# Patient Record
Sex: Female | Born: 1980 | Race: White | Hispanic: No | Marital: Married | Smoking: Former smoker
Health system: Southern US, Community
[De-identification: ages and names within clinical notes are randomized; demographics above are authoritative.]

## PROBLEM LIST (undated history)

## (undated) ENCOUNTER — Inpatient Hospital Stay (HOSPITAL_COMMUNITY): Payer: Self-pay

## (undated) DIAGNOSIS — N83209 Unspecified ovarian cyst, unspecified side: Secondary | ICD-10-CM

## (undated) DIAGNOSIS — A4902 Methicillin resistant Staphylococcus aureus infection, unspecified site: Secondary | ICD-10-CM

## (undated) DIAGNOSIS — R51 Headache: Secondary | ICD-10-CM

## (undated) DIAGNOSIS — F329 Major depressive disorder, single episode, unspecified: Secondary | ICD-10-CM

## (undated) DIAGNOSIS — N200 Calculus of kidney: Secondary | ICD-10-CM

## (undated) DIAGNOSIS — IMO0002 Reserved for concepts with insufficient information to code with codable children: Secondary | ICD-10-CM

## (undated) DIAGNOSIS — D649 Anemia, unspecified: Secondary | ICD-10-CM

## (undated) DIAGNOSIS — R87629 Unspecified abnormal cytological findings in specimens from vagina: Secondary | ICD-10-CM

## (undated) DIAGNOSIS — Z87442 Personal history of urinary calculi: Secondary | ICD-10-CM

## (undated) DIAGNOSIS — R87619 Unspecified abnormal cytological findings in specimens from cervix uteri: Secondary | ICD-10-CM

## (undated) DIAGNOSIS — F32A Depression, unspecified: Secondary | ICD-10-CM

## (undated) DIAGNOSIS — R519 Headache, unspecified: Secondary | ICD-10-CM

## (undated) HISTORY — DX: Headache: R51

## (undated) HISTORY — PX: COLPOSCOPY: SHX161

## (undated) HISTORY — PX: DENTAL SURGERY: SHX609

## (undated) HISTORY — DX: Headache, unspecified: R51.9

## (undated) HISTORY — DX: Major depressive disorder, single episode, unspecified: F32.9

## (undated) HISTORY — DX: Depression, unspecified: F32.A

## (undated) HISTORY — PX: CHOLECYSTECTOMY: SHX55

---
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
## 1999-01-10 ENCOUNTER — Encounter: Admission: RE | Admit: 1999-01-10 | Discharge: 1999-01-10 | Payer: Self-pay | Admitting: Family Medicine

## 1999-01-23 ENCOUNTER — Encounter: Admission: RE | Admit: 1999-01-23 | Discharge: 1999-01-23 | Payer: Self-pay | Admitting: Family Medicine

## 1999-02-04 ENCOUNTER — Inpatient Hospital Stay (HOSPITAL_COMMUNITY): Admission: AD | Admit: 1999-02-04 | Discharge: 1999-02-04 | Payer: Self-pay | Admitting: Obstetrics & Gynecology

## 1999-02-05 ENCOUNTER — Inpatient Hospital Stay (HOSPITAL_COMMUNITY): Admission: AD | Admit: 1999-02-05 | Discharge: 1999-02-08 | Payer: Self-pay | Admitting: *Deleted

## 1999-02-07 ENCOUNTER — Encounter: Payer: Self-pay | Admitting: *Deleted

## 1999-02-13 ENCOUNTER — Encounter: Admission: RE | Admit: 1999-02-13 | Discharge: 1999-02-13 | Payer: Self-pay | Admitting: Family Medicine

## 1999-02-16 ENCOUNTER — Inpatient Hospital Stay (HOSPITAL_COMMUNITY): Admission: AD | Admit: 1999-02-16 | Discharge: 1999-02-16 | Payer: Self-pay | Admitting: Obstetrics

## 1999-02-22 ENCOUNTER — Encounter: Admission: RE | Admit: 1999-02-22 | Discharge: 1999-02-22 | Payer: Self-pay | Admitting: Family Medicine

## 1999-02-27 ENCOUNTER — Encounter: Admission: RE | Admit: 1999-02-27 | Discharge: 1999-02-27 | Payer: Self-pay | Admitting: Family Medicine

## 1999-03-11 ENCOUNTER — Inpatient Hospital Stay (HOSPITAL_COMMUNITY): Admission: AD | Admit: 1999-03-11 | Discharge: 1999-03-11 | Payer: Self-pay | Admitting: Obstetrics

## 1999-03-12 ENCOUNTER — Inpatient Hospital Stay (HOSPITAL_COMMUNITY): Admission: AD | Admit: 1999-03-12 | Discharge: 1999-03-12 | Payer: Self-pay | Admitting: Obstetrics

## 1999-03-15 ENCOUNTER — Encounter: Admission: RE | Admit: 1999-03-15 | Discharge: 1999-03-15 | Payer: Self-pay | Admitting: Family Medicine

## 1999-03-19 ENCOUNTER — Inpatient Hospital Stay (HOSPITAL_COMMUNITY): Admission: AD | Admit: 1999-03-19 | Discharge: 1999-03-19 | Payer: Self-pay | Admitting: *Deleted

## 1999-03-19 ENCOUNTER — Encounter: Admission: RE | Admit: 1999-03-19 | Discharge: 1999-03-19 | Payer: Self-pay | Admitting: Family Medicine

## 1999-03-20 ENCOUNTER — Inpatient Hospital Stay (HOSPITAL_COMMUNITY): Admission: AD | Admit: 1999-03-20 | Discharge: 1999-03-20 | Payer: Self-pay | Admitting: Obstetrics

## 1999-03-21 ENCOUNTER — Inpatient Hospital Stay (HOSPITAL_COMMUNITY): Admission: AD | Admit: 1999-03-21 | Discharge: 1999-03-21 | Payer: Self-pay | Admitting: Obstetrics & Gynecology

## 1999-03-23 ENCOUNTER — Inpatient Hospital Stay (HOSPITAL_COMMUNITY): Admission: AD | Admit: 1999-03-23 | Discharge: 1999-03-23 | Payer: Self-pay | Admitting: *Deleted

## 1999-03-25 ENCOUNTER — Inpatient Hospital Stay (HOSPITAL_COMMUNITY): Admission: AD | Admit: 1999-03-25 | Discharge: 1999-03-25 | Payer: Self-pay | Admitting: Obstetrics & Gynecology

## 1999-03-27 ENCOUNTER — Encounter: Admission: RE | Admit: 1999-03-27 | Discharge: 1999-03-27 | Payer: Self-pay | Admitting: Family Medicine

## 1999-03-27 ENCOUNTER — Inpatient Hospital Stay (HOSPITAL_COMMUNITY): Admission: AD | Admit: 1999-03-27 | Discharge: 1999-03-27 | Payer: Self-pay | Admitting: Obstetrics

## 1999-03-30 ENCOUNTER — Inpatient Hospital Stay (HOSPITAL_COMMUNITY): Admission: AD | Admit: 1999-03-30 | Discharge: 1999-04-01 | Payer: Self-pay | Admitting: *Deleted

## 1999-04-02 ENCOUNTER — Encounter: Admission: RE | Admit: 1999-04-02 | Discharge: 1999-04-02 | Payer: Self-pay | Admitting: Sports Medicine

## 1999-04-26 ENCOUNTER — Encounter (INDEPENDENT_AMBULATORY_CARE_PROVIDER_SITE_OTHER): Payer: Self-pay | Admitting: *Deleted

## 1999-04-26 LAB — CONVERTED CEMR LAB

## 1999-05-09 ENCOUNTER — Encounter: Admission: RE | Admit: 1999-05-09 | Discharge: 1999-05-09 | Payer: Self-pay | Admitting: Family Medicine

## 1999-05-15 ENCOUNTER — Other Ambulatory Visit: Admission: RE | Admit: 1999-05-15 | Discharge: 1999-05-15 | Payer: Self-pay | Admitting: Obstetrics & Gynecology

## 1999-05-15 ENCOUNTER — Encounter: Admission: RE | Admit: 1999-05-15 | Discharge: 1999-05-15 | Payer: Self-pay | Admitting: Family Medicine

## 1999-07-29 ENCOUNTER — Encounter: Admission: RE | Admit: 1999-07-29 | Discharge: 1999-07-29 | Payer: Self-pay | Admitting: Family Medicine

## 1999-08-13 ENCOUNTER — Ambulatory Visit (HOSPITAL_COMMUNITY): Admission: RE | Admit: 1999-08-13 | Discharge: 1999-08-13 | Payer: Self-pay | Admitting: *Deleted

## 1999-08-13 ENCOUNTER — Encounter: Admission: RE | Admit: 1999-08-13 | Discharge: 1999-08-13 | Payer: Self-pay | Admitting: Sports Medicine

## 1999-08-20 ENCOUNTER — Encounter: Admission: RE | Admit: 1999-08-20 | Discharge: 1999-08-20 | Payer: Self-pay | Admitting: Sports Medicine

## 1999-08-20 ENCOUNTER — Other Ambulatory Visit: Admission: RE | Admit: 1999-08-20 | Discharge: 1999-08-20 | Payer: Self-pay | Admitting: Obstetrics & Gynecology

## 1999-08-27 ENCOUNTER — Encounter: Admission: RE | Admit: 1999-08-27 | Discharge: 1999-08-27 | Payer: Self-pay | Admitting: Sports Medicine

## 1999-08-29 ENCOUNTER — Encounter: Admission: RE | Admit: 1999-08-29 | Discharge: 1999-08-29 | Payer: Self-pay | Admitting: Family Medicine

## 1999-10-08 ENCOUNTER — Encounter: Admission: RE | Admit: 1999-10-08 | Discharge: 1999-10-08 | Payer: Self-pay | Admitting: Sports Medicine

## 1999-10-08 ENCOUNTER — Other Ambulatory Visit: Admission: RE | Admit: 1999-10-08 | Discharge: 1999-10-08 | Payer: Self-pay | Admitting: Family Medicine

## 1999-10-10 ENCOUNTER — Encounter: Admission: RE | Admit: 1999-10-10 | Discharge: 1999-10-10 | Payer: Self-pay | Admitting: Family Medicine

## 1999-11-18 ENCOUNTER — Encounter: Admission: RE | Admit: 1999-11-18 | Discharge: 1999-11-18 | Payer: Self-pay | Admitting: Family Medicine

## 2000-02-04 ENCOUNTER — Encounter: Admission: RE | Admit: 2000-02-04 | Discharge: 2000-02-04 | Payer: Self-pay | Admitting: Family Medicine

## 2000-03-03 ENCOUNTER — Encounter: Admission: RE | Admit: 2000-03-03 | Discharge: 2000-03-03 | Payer: Self-pay | Admitting: Family Medicine

## 2000-03-09 ENCOUNTER — Encounter: Admission: RE | Admit: 2000-03-09 | Discharge: 2000-03-09 | Payer: Self-pay | Admitting: Family Medicine

## 2000-06-02 ENCOUNTER — Emergency Department (HOSPITAL_COMMUNITY): Admission: EM | Admit: 2000-06-02 | Discharge: 2000-06-02 | Payer: Self-pay | Admitting: Emergency Medicine

## 2000-06-02 ENCOUNTER — Encounter: Payer: Self-pay | Admitting: Emergency Medicine

## 2000-07-09 ENCOUNTER — Encounter: Admission: RE | Admit: 2000-07-09 | Discharge: 2000-10-07 | Payer: Self-pay | Admitting: Orthopedic Surgery

## 2000-09-10 ENCOUNTER — Encounter: Admission: RE | Admit: 2000-09-10 | Discharge: 2000-09-10 | Payer: Self-pay | Admitting: Family Medicine

## 2000-09-18 ENCOUNTER — Other Ambulatory Visit: Admission: RE | Admit: 2000-09-18 | Discharge: 2000-09-18 | Payer: Self-pay | Admitting: Obstetrics & Gynecology

## 2000-09-18 ENCOUNTER — Encounter: Admission: RE | Admit: 2000-09-18 | Discharge: 2000-09-18 | Payer: Self-pay | Admitting: Family Medicine

## 2000-09-18 ENCOUNTER — Other Ambulatory Visit: Admission: RE | Admit: 2000-09-18 | Discharge: 2000-09-18 | Payer: Self-pay | Admitting: Family Medicine

## 2000-10-08 ENCOUNTER — Encounter: Admission: RE | Admit: 2000-10-08 | Discharge: 2000-10-08 | Payer: Self-pay | Admitting: Family Medicine

## 2002-01-26 ENCOUNTER — Encounter: Admission: RE | Admit: 2002-01-26 | Discharge: 2002-01-26 | Payer: Self-pay | Admitting: Family Medicine

## 2002-02-14 ENCOUNTER — Emergency Department (HOSPITAL_COMMUNITY): Admission: EM | Admit: 2002-02-14 | Discharge: 2002-02-14 | Payer: Self-pay

## 2002-02-14 ENCOUNTER — Emergency Department (HOSPITAL_COMMUNITY): Admission: EM | Admit: 2002-02-14 | Discharge: 2002-02-14 | Payer: Self-pay | Admitting: Emergency Medicine

## 2002-08-31 ENCOUNTER — Emergency Department (HOSPITAL_COMMUNITY): Admission: AD | Admit: 2002-08-31 | Discharge: 2002-08-31 | Payer: Self-pay | Admitting: Emergency Medicine

## 2003-07-15 ENCOUNTER — Emergency Department (HOSPITAL_COMMUNITY): Admission: EM | Admit: 2003-07-15 | Discharge: 2003-07-16 | Payer: Self-pay | Admitting: Emergency Medicine

## 2004-02-15 ENCOUNTER — Ambulatory Visit: Payer: Self-pay | Admitting: Family Medicine

## 2004-02-19 ENCOUNTER — Encounter: Admission: RE | Admit: 2004-02-19 | Discharge: 2004-02-19 | Payer: Self-pay | Admitting: Sports Medicine

## 2004-03-06 ENCOUNTER — Ambulatory Visit: Payer: Self-pay | Admitting: Family Medicine

## 2004-07-02 ENCOUNTER — Emergency Department (HOSPITAL_COMMUNITY): Admission: EM | Admit: 2004-07-02 | Discharge: 2004-07-02 | Payer: Self-pay | Admitting: Emergency Medicine

## 2004-08-09 ENCOUNTER — Ambulatory Visit (HOSPITAL_COMMUNITY): Admission: RE | Admit: 2004-08-09 | Discharge: 2004-08-09 | Payer: Self-pay | Admitting: Urology

## 2005-06-22 ENCOUNTER — Emergency Department (HOSPITAL_COMMUNITY): Admission: EM | Admit: 2005-06-22 | Discharge: 2005-06-22 | Payer: Self-pay | Admitting: Emergency Medicine

## 2006-07-23 DIAGNOSIS — F329 Major depressive disorder, single episode, unspecified: Secondary | ICD-10-CM | POA: Insufficient documentation

## 2006-07-23 DIAGNOSIS — F17200 Nicotine dependence, unspecified, uncomplicated: Secondary | ICD-10-CM | POA: Insufficient documentation

## 2006-07-24 ENCOUNTER — Encounter (INDEPENDENT_AMBULATORY_CARE_PROVIDER_SITE_OTHER): Payer: Self-pay | Admitting: *Deleted

## 2006-11-16 ENCOUNTER — Emergency Department (HOSPITAL_COMMUNITY): Admission: EM | Admit: 2006-11-16 | Discharge: 2006-11-17 | Payer: Self-pay | Admitting: Emergency Medicine

## 2006-11-18 ENCOUNTER — Emergency Department (HOSPITAL_COMMUNITY): Admission: EM | Admit: 2006-11-18 | Discharge: 2006-11-18 | Payer: Self-pay | Admitting: Emergency Medicine

## 2006-11-22 ENCOUNTER — Emergency Department (HOSPITAL_COMMUNITY): Admission: EM | Admit: 2006-11-22 | Discharge: 2006-11-23 | Payer: Self-pay | Admitting: Emergency Medicine

## 2006-12-11 ENCOUNTER — Emergency Department (HOSPITAL_COMMUNITY): Admission: EM | Admit: 2006-12-11 | Discharge: 2006-12-11 | Payer: Self-pay | Admitting: Emergency Medicine

## 2006-12-24 ENCOUNTER — Telehealth: Payer: Self-pay | Admitting: *Deleted

## 2006-12-25 ENCOUNTER — Encounter: Payer: Self-pay | Admitting: Family Medicine

## 2006-12-25 ENCOUNTER — Ambulatory Visit: Payer: Self-pay | Admitting: Family Medicine

## 2006-12-25 LAB — CONVERTED CEMR LAB
GC Probe Amp, Genital: NEGATIVE
KOH Prep: NEGATIVE
Whiff Test: NEGATIVE

## 2006-12-31 ENCOUNTER — Telehealth: Payer: Self-pay | Admitting: Family Medicine

## 2007-01-01 ENCOUNTER — Telehealth: Payer: Self-pay | Admitting: Family Medicine

## 2007-01-05 ENCOUNTER — Encounter: Payer: Self-pay | Admitting: Family Medicine

## 2007-02-24 ENCOUNTER — Emergency Department (HOSPITAL_COMMUNITY): Admission: EM | Admit: 2007-02-24 | Discharge: 2007-02-24 | Payer: Self-pay | Admitting: Emergency Medicine

## 2007-03-12 ENCOUNTER — Telehealth: Payer: Self-pay | Admitting: *Deleted

## 2007-03-18 ENCOUNTER — Ambulatory Visit: Payer: Self-pay | Admitting: Family Medicine

## 2007-04-01 ENCOUNTER — Ambulatory Visit: Payer: Self-pay | Admitting: Family Medicine

## 2007-04-07 ENCOUNTER — Ambulatory Visit: Payer: Self-pay | Admitting: Family Medicine

## 2007-04-07 ENCOUNTER — Encounter: Payer: Self-pay | Admitting: Family Medicine

## 2007-04-07 DIAGNOSIS — Z862 Personal history of diseases of the blood and blood-forming organs and certain disorders involving the immune mechanism: Secondary | ICD-10-CM | POA: Insufficient documentation

## 2007-04-07 LAB — CONVERTED CEMR LAB
MCHC: 33.2 g/dL (ref 30.0–36.0)
TSH: 1.344 microintl units/mL (ref 0.350–5.50)
WBC: 6.2 10*3/uL (ref 4.0–10.5)

## 2007-04-13 ENCOUNTER — Emergency Department (HOSPITAL_COMMUNITY): Admission: EM | Admit: 2007-04-13 | Discharge: 2007-04-13 | Payer: Self-pay | Admitting: Emergency Medicine

## 2007-05-24 ENCOUNTER — Ambulatory Visit: Payer: Self-pay

## 2007-05-27 DIAGNOSIS — N200 Calculus of kidney: Secondary | ICD-10-CM

## 2007-05-27 HISTORY — DX: Calculus of kidney: N20.0

## 2007-07-21 ENCOUNTER — Ambulatory Visit: Payer: Self-pay | Admitting: Family Medicine

## 2007-07-21 ENCOUNTER — Encounter: Payer: Self-pay | Admitting: Family Medicine

## 2007-07-21 ENCOUNTER — Telehealth: Payer: Self-pay | Admitting: *Deleted

## 2007-07-21 LAB — CONVERTED CEMR LAB
Amylase: 43 units/L (ref 0–105)
Chloride: 105 meq/L (ref 96–112)
Glucose, Bld: 85 mg/dL (ref 70–99)
Sodium: 139 meq/L (ref 135–145)

## 2007-07-23 ENCOUNTER — Telehealth: Payer: Self-pay | Admitting: Family Medicine

## 2007-10-04 ENCOUNTER — Telehealth: Payer: Self-pay | Admitting: *Deleted

## 2007-11-19 ENCOUNTER — Telehealth (INDEPENDENT_AMBULATORY_CARE_PROVIDER_SITE_OTHER): Payer: Self-pay | Admitting: *Deleted

## 2007-11-19 ENCOUNTER — Emergency Department (HOSPITAL_COMMUNITY): Admission: EM | Admit: 2007-11-19 | Discharge: 2007-11-19 | Payer: Self-pay | Admitting: Emergency Medicine

## 2007-12-20 ENCOUNTER — Telehealth: Payer: Self-pay | Admitting: *Deleted

## 2007-12-23 ENCOUNTER — Telehealth: Payer: Self-pay | Admitting: *Deleted

## 2007-12-26 ENCOUNTER — Emergency Department (HOSPITAL_BASED_OUTPATIENT_CLINIC_OR_DEPARTMENT_OTHER): Admission: EM | Admit: 2007-12-26 | Discharge: 2007-12-26 | Payer: Self-pay | Admitting: Emergency Medicine

## 2008-02-02 ENCOUNTER — Encounter: Payer: Self-pay | Admitting: Family Medicine

## 2008-02-02 ENCOUNTER — Other Ambulatory Visit: Admission: RE | Admit: 2008-02-02 | Discharge: 2008-02-02 | Payer: Self-pay | Admitting: Family Medicine

## 2008-02-02 ENCOUNTER — Ambulatory Visit: Payer: Self-pay | Admitting: Family Medicine

## 2008-02-02 LAB — CONVERTED CEMR LAB
Chlamydia, DNA Probe: NEGATIVE
GC Probe Amp, Genital: NEGATIVE

## 2008-04-05 ENCOUNTER — Ambulatory Visit: Payer: Self-pay | Admitting: Family Medicine

## 2008-05-08 ENCOUNTER — Telehealth: Payer: Self-pay | Admitting: *Deleted

## 2008-05-10 ENCOUNTER — Telehealth: Payer: Self-pay | Admitting: *Deleted

## 2008-05-11 ENCOUNTER — Ambulatory Visit: Payer: Self-pay | Admitting: Family Medicine

## 2008-05-11 ENCOUNTER — Encounter: Payer: Self-pay | Admitting: Family Medicine

## 2008-05-11 LAB — CONVERTED CEMR LAB
Antibody Screen: NEGATIVE
Basophils Relative: 0 % (ref 0–1)
Hemoglobin: 13 g/dL (ref 12.0–15.0)
Lymphocytes Relative: 18 % (ref 12–46)
Lymphs Abs: 1.4 10*3/uL (ref 0.7–4.0)
MCV: 90.3 fL (ref 78.0–100.0)
Monocytes Relative: 8 % (ref 3–12)
Neutrophils Relative %: 73 % (ref 43–77)
Platelets: 284 10*3/uL (ref 150–400)
Rh Type: NEGATIVE
Rubella: 22.5 intl units/mL — ABNORMAL HIGH
WBC: 7.7 10*3/uL (ref 4.0–10.5)

## 2008-05-12 ENCOUNTER — Encounter: Payer: Self-pay | Admitting: Family Medicine

## 2008-05-12 DIAGNOSIS — O360990 Maternal care for other rhesus isoimmunization, unspecified trimester, not applicable or unspecified: Secondary | ICD-10-CM | POA: Insufficient documentation

## 2008-05-16 ENCOUNTER — Inpatient Hospital Stay (HOSPITAL_COMMUNITY): Admission: AD | Admit: 2008-05-16 | Discharge: 2008-05-16 | Payer: Self-pay | Admitting: Obstetrics & Gynecology

## 2008-05-23 ENCOUNTER — Inpatient Hospital Stay (HOSPITAL_COMMUNITY): Admission: RE | Admit: 2008-05-23 | Discharge: 2008-05-23 | Payer: Self-pay | Admitting: Obstetrics & Gynecology

## 2008-05-28 ENCOUNTER — Telehealth: Payer: Self-pay | Admitting: Family Medicine

## 2008-05-31 ENCOUNTER — Ambulatory Visit: Payer: Self-pay | Admitting: Family Medicine

## 2008-05-31 ENCOUNTER — Encounter (INDEPENDENT_AMBULATORY_CARE_PROVIDER_SITE_OTHER): Payer: Self-pay | Admitting: Family Medicine

## 2008-05-31 ENCOUNTER — Other Ambulatory Visit: Admission: RE | Admit: 2008-05-31 | Discharge: 2008-05-31 | Payer: Self-pay | Admitting: Family Medicine

## 2008-05-31 ENCOUNTER — Encounter: Payer: Self-pay | Admitting: Family Medicine

## 2008-05-31 LAB — CONVERTED CEMR LAB: Chlamydia, DNA Probe: NEGATIVE

## 2008-06-01 ENCOUNTER — Telehealth: Payer: Self-pay | Admitting: Family Medicine

## 2008-06-04 ENCOUNTER — Inpatient Hospital Stay (HOSPITAL_COMMUNITY): Admission: AD | Admit: 2008-06-04 | Discharge: 2008-06-04 | Payer: Self-pay | Admitting: Obstetrics and Gynecology

## 2008-06-04 ENCOUNTER — Ambulatory Visit: Payer: Self-pay | Admitting: Obstetrics and Gynecology

## 2008-06-05 ENCOUNTER — Telehealth: Payer: Self-pay | Admitting: Family Medicine

## 2008-06-29 ENCOUNTER — Ambulatory Visit: Payer: Self-pay | Admitting: Family Medicine

## 2008-07-04 ENCOUNTER — Encounter: Payer: Self-pay | Admitting: *Deleted

## 2008-07-07 ENCOUNTER — Encounter: Payer: Self-pay | Admitting: Family Medicine

## 2008-07-07 ENCOUNTER — Ambulatory Visit (HOSPITAL_COMMUNITY): Admission: RE | Admit: 2008-07-07 | Discharge: 2008-07-07 | Payer: Self-pay | Admitting: Family Medicine

## 2008-07-12 ENCOUNTER — Telehealth: Payer: Self-pay | Admitting: Family Medicine

## 2008-07-12 ENCOUNTER — Inpatient Hospital Stay (HOSPITAL_COMMUNITY): Admission: AD | Admit: 2008-07-12 | Discharge: 2008-07-12 | Payer: Self-pay | Admitting: Obstetrics and Gynecology

## 2008-07-27 ENCOUNTER — Ambulatory Visit: Payer: Self-pay | Admitting: Family Medicine

## 2008-07-27 LAB — CONVERTED CEMR LAB
Glucose, Urine, Semiquant: NEGATIVE
Nitrite: NEGATIVE
Whiff Test: NEGATIVE
pH: 6.5

## 2008-07-28 ENCOUNTER — Encounter: Payer: Self-pay | Admitting: Family Medicine

## 2008-08-04 ENCOUNTER — Ambulatory Visit (HOSPITAL_COMMUNITY): Admission: RE | Admit: 2008-08-04 | Discharge: 2008-08-04 | Payer: Self-pay | Admitting: Family Medicine

## 2008-08-18 ENCOUNTER — Encounter: Payer: Self-pay | Admitting: Family Medicine

## 2008-08-18 ENCOUNTER — Ambulatory Visit (HOSPITAL_COMMUNITY): Admission: RE | Admit: 2008-08-18 | Discharge: 2008-08-18 | Payer: Self-pay | Admitting: Family Medicine

## 2008-08-23 ENCOUNTER — Emergency Department (HOSPITAL_BASED_OUTPATIENT_CLINIC_OR_DEPARTMENT_OTHER): Admission: EM | Admit: 2008-08-23 | Discharge: 2008-08-23 | Payer: Self-pay | Admitting: Emergency Medicine

## 2008-08-30 ENCOUNTER — Telehealth (INDEPENDENT_AMBULATORY_CARE_PROVIDER_SITE_OTHER): Payer: Self-pay | Admitting: *Deleted

## 2008-09-28 ENCOUNTER — Ambulatory Visit: Payer: Self-pay | Admitting: Family Medicine

## 2008-09-28 ENCOUNTER — Telehealth: Payer: Self-pay | Admitting: Family Medicine

## 2008-09-28 ENCOUNTER — Encounter: Payer: Self-pay | Admitting: Family Medicine

## 2008-09-28 LAB — CONVERTED CEMR LAB: TSH: 1.131 microintl units/mL (ref 0.350–4.500)

## 2008-09-30 ENCOUNTER — Ambulatory Visit: Payer: Self-pay | Admitting: Diagnostic Radiology

## 2008-09-30 ENCOUNTER — Emergency Department (HOSPITAL_BASED_OUTPATIENT_CLINIC_OR_DEPARTMENT_OTHER): Admission: EM | Admit: 2008-09-30 | Discharge: 2008-09-30 | Payer: Self-pay | Admitting: Emergency Medicine

## 2008-10-23 ENCOUNTER — Inpatient Hospital Stay (HOSPITAL_COMMUNITY): Admission: AD | Admit: 2008-10-23 | Discharge: 2008-10-23 | Payer: Self-pay | Admitting: Obstetrics

## 2008-10-26 ENCOUNTER — Ambulatory Visit (HOSPITAL_COMMUNITY): Admission: AD | Admit: 2008-10-26 | Discharge: 2008-10-26 | Payer: Self-pay | Admitting: Obstetrics

## 2008-12-02 ENCOUNTER — Inpatient Hospital Stay (HOSPITAL_COMMUNITY): Admission: AD | Admit: 2008-12-02 | Discharge: 2008-12-02 | Payer: Self-pay | Admitting: Obstetrics

## 2008-12-26 ENCOUNTER — Ambulatory Visit: Payer: Self-pay | Admitting: Family Medicine

## 2008-12-26 ENCOUNTER — Telehealth: Payer: Self-pay | Admitting: Family Medicine

## 2008-12-27 ENCOUNTER — Encounter: Payer: Self-pay | Admitting: Family Medicine

## 2008-12-29 ENCOUNTER — Inpatient Hospital Stay (HOSPITAL_COMMUNITY): Admission: AD | Admit: 2008-12-29 | Discharge: 2008-12-29 | Payer: Self-pay | Admitting: Obstetrics

## 2009-01-01 ENCOUNTER — Encounter: Payer: Self-pay | Admitting: Family Medicine

## 2009-01-01 ENCOUNTER — Inpatient Hospital Stay (HOSPITAL_COMMUNITY): Admission: AD | Admit: 2009-01-01 | Discharge: 2009-01-01 | Payer: Self-pay | Admitting: Obstetrics

## 2009-01-02 ENCOUNTER — Inpatient Hospital Stay (HOSPITAL_COMMUNITY): Admission: AD | Admit: 2009-01-02 | Discharge: 2009-01-02 | Payer: Self-pay | Admitting: Obstetrics

## 2009-01-05 ENCOUNTER — Telehealth (INDEPENDENT_AMBULATORY_CARE_PROVIDER_SITE_OTHER): Payer: Self-pay | Admitting: *Deleted

## 2009-01-07 ENCOUNTER — Inpatient Hospital Stay (HOSPITAL_COMMUNITY): Admission: AD | Admit: 2009-01-07 | Discharge: 2009-01-07 | Payer: Self-pay | Admitting: Obstetrics

## 2009-01-08 ENCOUNTER — Inpatient Hospital Stay (HOSPITAL_COMMUNITY): Admission: AD | Admit: 2009-01-08 | Discharge: 2009-01-08 | Payer: Self-pay | Admitting: Obstetrics

## 2009-01-16 ENCOUNTER — Inpatient Hospital Stay (HOSPITAL_COMMUNITY): Admission: RE | Admit: 2009-01-16 | Discharge: 2009-01-18 | Payer: Self-pay | Admitting: Obstetrics

## 2009-02-07 ENCOUNTER — Ambulatory Visit: Payer: Self-pay | Admitting: Family Medicine

## 2009-02-12 ENCOUNTER — Ambulatory Visit: Payer: Self-pay | Admitting: Family Medicine

## 2009-03-02 ENCOUNTER — Ambulatory Visit: Payer: Self-pay | Admitting: Family Medicine

## 2009-03-02 ENCOUNTER — Encounter: Payer: Self-pay | Admitting: Family Medicine

## 2009-05-03 ENCOUNTER — Telehealth: Payer: Self-pay | Admitting: Family Medicine

## 2009-05-04 ENCOUNTER — Ambulatory Visit: Payer: Self-pay | Admitting: Family Medicine

## 2009-05-04 ENCOUNTER — Encounter: Payer: Self-pay | Admitting: Family Medicine

## 2009-05-08 ENCOUNTER — Telehealth: Payer: Self-pay | Admitting: Family Medicine

## 2009-05-08 ENCOUNTER — Ambulatory Visit: Payer: Self-pay | Admitting: Family Medicine

## 2009-07-11 ENCOUNTER — Other Ambulatory Visit: Admission: RE | Admit: 2009-07-11 | Discharge: 2009-07-11 | Payer: Self-pay | Admitting: Family Medicine

## 2009-07-11 ENCOUNTER — Ambulatory Visit: Payer: Self-pay | Admitting: Family Medicine

## 2009-07-11 ENCOUNTER — Encounter: Payer: Self-pay | Admitting: Family Medicine

## 2009-07-11 DIAGNOSIS — E669 Obesity, unspecified: Secondary | ICD-10-CM | POA: Insufficient documentation

## 2009-07-11 LAB — CONVERTED CEMR LAB: GC Probe Amp, Genital: NEGATIVE

## 2009-07-17 ENCOUNTER — Telehealth: Payer: Self-pay | Admitting: Family Medicine

## 2009-07-18 ENCOUNTER — Encounter: Payer: Self-pay | Admitting: Family Medicine

## 2009-07-23 ENCOUNTER — Telehealth: Payer: Self-pay | Admitting: Family Medicine

## 2009-08-09 ENCOUNTER — Encounter: Payer: Self-pay | Admitting: Family Medicine

## 2009-08-09 ENCOUNTER — Ambulatory Visit: Payer: Self-pay | Admitting: Family Medicine

## 2009-08-09 DIAGNOSIS — R8789 Other abnormal findings in specimens from female genital organs: Secondary | ICD-10-CM | POA: Insufficient documentation

## 2009-09-03 ENCOUNTER — Encounter: Payer: Self-pay | Admitting: Family Medicine

## 2009-09-03 ENCOUNTER — Ambulatory Visit: Payer: Self-pay | Admitting: Family Medicine

## 2009-09-03 ENCOUNTER — Telehealth: Payer: Self-pay | Admitting: Family Medicine

## 2009-09-03 DIAGNOSIS — F39 Unspecified mood [affective] disorder: Secondary | ICD-10-CM | POA: Insufficient documentation

## 2009-09-04 LAB — CONVERTED CEMR LAB
RBC: 4.37 M/uL (ref 3.87–5.11)
TSH: 1.714 microintl units/mL (ref 0.350–4.500)
WBC: 8.8 10*3/uL (ref 4.0–10.5)

## 2009-09-28 ENCOUNTER — Telehealth: Payer: Self-pay | Admitting: Family Medicine

## 2009-09-28 ENCOUNTER — Ambulatory Visit: Payer: Self-pay | Admitting: Family Medicine

## 2009-10-03 ENCOUNTER — Ambulatory Visit: Payer: Self-pay | Admitting: Family Medicine

## 2009-10-09 ENCOUNTER — Telehealth: Payer: Self-pay | Admitting: Family Medicine

## 2009-10-10 ENCOUNTER — Ambulatory Visit: Payer: Self-pay | Admitting: Family Medicine

## 2009-10-13 ENCOUNTER — Emergency Department (HOSPITAL_BASED_OUTPATIENT_CLINIC_OR_DEPARTMENT_OTHER): Admission: EM | Admit: 2009-10-13 | Discharge: 2009-10-13 | Payer: Self-pay | Admitting: Emergency Medicine

## 2009-10-24 ENCOUNTER — Telehealth: Payer: Self-pay | Admitting: *Deleted

## 2009-10-29 ENCOUNTER — Ambulatory Visit: Payer: Self-pay | Admitting: Family Medicine

## 2009-11-07 ENCOUNTER — Ambulatory Visit: Payer: Self-pay | Admitting: Family Medicine

## 2009-11-07 ENCOUNTER — Telehealth: Payer: Self-pay | Admitting: Family Medicine

## 2009-11-07 LAB — CONVERTED CEMR LAB: Rapid Strep: NEGATIVE

## 2009-11-14 ENCOUNTER — Telehealth: Payer: Self-pay | Admitting: Family Medicine

## 2009-11-14 ENCOUNTER — Ambulatory Visit: Payer: Self-pay | Admitting: Family Medicine

## 2009-11-16 ENCOUNTER — Emergency Department (HOSPITAL_COMMUNITY): Admission: EM | Admit: 2009-11-16 | Discharge: 2009-11-16 | Payer: Self-pay | Admitting: Emergency Medicine

## 2009-11-20 ENCOUNTER — Ambulatory Visit: Payer: Self-pay | Admitting: Family Medicine

## 2009-11-20 ENCOUNTER — Encounter: Payer: Self-pay | Admitting: Family Medicine

## 2009-11-27 ENCOUNTER — Telehealth: Payer: Self-pay | Admitting: Family Medicine

## 2009-11-28 ENCOUNTER — Encounter: Payer: Self-pay | Admitting: Family Medicine

## 2009-11-28 ENCOUNTER — Ambulatory Visit: Payer: Self-pay | Admitting: Family Medicine

## 2009-12-04 ENCOUNTER — Emergency Department (HOSPITAL_COMMUNITY): Admission: EM | Admit: 2009-12-04 | Discharge: 2009-12-04 | Payer: Self-pay | Admitting: Family Medicine

## 2009-12-04 ENCOUNTER — Telehealth: Payer: Self-pay | Admitting: *Deleted

## 2009-12-05 ENCOUNTER — Ambulatory Visit: Payer: Self-pay | Admitting: Family Medicine

## 2009-12-25 ENCOUNTER — Emergency Department (HOSPITAL_COMMUNITY): Admission: EM | Admit: 2009-12-25 | Discharge: 2009-12-25 | Payer: Self-pay | Admitting: Family Medicine

## 2009-12-25 ENCOUNTER — Encounter: Payer: Self-pay | Admitting: *Deleted

## 2010-01-17 ENCOUNTER — Encounter: Payer: Self-pay | Admitting: Family Medicine

## 2010-03-20 ENCOUNTER — Telehealth: Payer: Self-pay | Admitting: *Deleted

## 2010-03-21 ENCOUNTER — Ambulatory Visit: Payer: Self-pay | Admitting: Family Medicine

## 2010-03-21 ENCOUNTER — Encounter: Payer: Self-pay | Admitting: Sports Medicine

## 2010-03-21 LAB — CONVERTED CEMR LAB
Chlamydia, DNA Probe: NEGATIVE
GC Probe Amp, Genital: NEGATIVE
Whiff Test: NEGATIVE

## 2010-03-22 ENCOUNTER — Telehealth: Payer: Self-pay | Admitting: *Deleted

## 2010-04-24 ENCOUNTER — Ambulatory Visit: Payer: Self-pay | Admitting: Diagnostic Radiology

## 2010-04-24 ENCOUNTER — Emergency Department (HOSPITAL_BASED_OUTPATIENT_CLINIC_OR_DEPARTMENT_OTHER)
Admission: EM | Admit: 2010-04-24 | Discharge: 2010-04-24 | Payer: Self-pay | Source: Home / Self Care | Admitting: Emergency Medicine

## 2010-06-25 NOTE — Progress Notes (Signed)
Summary: triage  Phone Note Call from Patient Call back at Home Phone [phone]   Caller: Patient Summary of Call: Pt was seen this am for sore throat negative strep test, but just found out that son has strep. Initial call taken by: Clydell Hakim,  November 07, 2009 2:31 PM  Follow-up for Phone Call        LM for pt that I was forwarding to md who saw her & will call her with response Follow-up by: Golden Circle RN,  November 07, 2009 2:50 PM  Additional Follow-up for Phone Call Additional follow up Details #1::        she called back. [facility] [address]. would like antibiotic.  states she dropped the rx off but does not think medicaid will cover these Additional Follow-up by: Golden Circle RN,  November 07, 2009 2:52 PM    Additional Follow-up for Phone Call Additional follow up Details #2::    rapid strep negative.  pt's predominant sxs was cough.  strep throat does not cause cough.  No fevers, no LAD, no pharyngeal erythema/exudates.  Pt likely does not have strep throat.  If sxs change, to return to be evaluated.  no abx for now.  if hydromet too expensive, could try benzonatate pearls but only work in 50% of patients.  Cough syrup likely better. Follow-up by: Eustaquio Boyden  MD,  November 07, 2009 2:57 PM  Additional Follow-up for Phone Call Additional follow up Details #3:: Details for Additional Follow-up Action Taken: informed pt of md response. she said "ok" Additional Follow-up by: Golden Circle RN,  November 07, 2009 3:32 PM  New/Updated Medications: BENZONATATE 100 MG CAPS (BENZONATATE) take one by mouth three times a day as needed cough Prescriptions: BENZONATATE 100 MG CAPS (BENZONATATE) take one by mouth three times a day as needed cough  #30 x 0   Entered and Authorized by:   Eustaquio Boyden  MD   Signed by:   Eustaquio Boyden  MD on 11/07/2009   Method used:   [facility]        [facility]  [address] [phone][facility] (retail)       [address] [place], [place]  [address]       Ph: [phone] or [phone]       Fax: [phone]   RxID:   [id number]

## 2010-06-25 NOTE — Miscellaneous (Signed)
Summary: Consent IUD Placement  Consent IUD Placement   Imported By: Clydell Hakim 11/27/2009 16:22:45  _____________________________________________________________________  External Attachment:    Type:   Image     Comment:   External Document

## 2010-06-25 NOTE — Assessment & Plan Note (Signed)
Summary: cough/congestion,df   Vital Signs:  Patient profile:   30 year old female Height:      64 inches Weight:      182 pounds BMI:     31.35 Temp:     98.2 degrees F oral Pulse rate:   64 / minute BP sitting:   99 / 74  (right arm) Cuff size:   regular  Vitals Entered By: Tessie Fass CMA (November 07, 2009 9:49 AM) CC: sore throat and cough x 10 days Is Patient Diabetic? No   Primary Care Provider:  Asher Muir MD  CC:  sore throat and cough x 10 days.  History of Present Illness: CC: cough/ST  1 1/2 wk h/o scratchy throat and cough (dry).  Took allergy medicine which didn't help.  Coughing keeping her up at night.  Robitussin DM not helping.  No itchy eyes, RN, fevers, chills, nausea, vomiting, stool changes.  No sick contacts.  No HA, no postnasal drip.  No h/o reflux or acid sxs.  Habits & Providers  Alcohol-Tobacco-Diet     Tobacco Status: quit     Year Quit: 2009  Allergies (verified): 1)  ! Penicillin V Potassium (Penicillin V Potassium)  Past History:     Past Medical History: Reviewed history from 07/11/2009 and no changes required. hx of depression 11/99 Rx`d with Zoloft, resolved hx of nephrolithiasis NSVD X 3 abnormal pap 2001; colpo normal.  no abnormal paps since  Past Surgical History: Reviewed history from 07/11/2009 and no changes required. IUD insertion 02/04/00 - 11/08 IUD again in Fall 2010   Review of Systems       per HPI  Physical Exam  General:  Well-developed,well-nourished,in no acute distress; alert,appropriate and cooperative throughout examination Eyes:  PERRLA Ears:  tms and canals clear Nose:  External nasal examination shows no deformity or inflammation. Nasal mucosa are pink and moist without lesions or exudates. Mouth:  Oral mucosa and oropharynx without lesions or exudates.  Teeth in good repair. Neck:  No deformities, masses, or tenderness noted. Lungs:  Normal respiratory effort, chest expands  symmetrically. Lungs are clear to auscultation, no crackles or wheezes. Heart:  Normal rate and regular rhythm. S1 and S2 normal without gallop, murmur, click, rub or other extra sounds. Extremities:  No clubbing, cyanosis, edema, or deformity noted  Skin:  Intact without suspicious lesions or rashes   Impression & Recommendations:  Problem # 1:  COUGH (ICD-786.2)  most likely post-viral cough.  reassured.  treat with suppression.  Red flags to return discussed.  Orders: [facility]- Est Level  3 (59563)  Complete Medication List: 1)  Tussionex Pennkinetic Er 8-10 Mg/47ml Lqcr (Chlorpheniramine-hydrocodone) .... 5cc q4-6 hours as needed cough.  100cc 2)  Hydromet 5-1.5 Mg/76ml Syrp (Hydrocodone-homatropine) .... 5cc q4-6 hours as needed cough. 100cc  Other Orders: Rapid Strep-[facility] ([id number])  Patient Instructions: 1)  You have a post-infectious cough.  THis can last 4-6 weeks to get better. 2)  Cough syrup (tussionex pennkinetic, if too expensive, then fill hydromet) as needed. 3)  Please return if not improving as expected, or if high fevers (>101.5) or other concerns. 4)  Call clinic with questions.  Pleasure to see you today! Prescriptions: HYDROMET 5-1.5 MG/5ML SYRP (HYDROCODONE-HOMATROPINE) 5cc Q4-6 hours as needed cough. 100cc  #1 x 0   Entered and Authorized by:   Eustaquio Boyden  MD   Signed by:   Eustaquio Boyden  MD on 11/07/2009   Method used:   Print then Give to  Patient   RxID:   [id number] Sandria Senter ER 8-10 MG/5ML LQCR (CHLORPHENIRAMINE-HYDROCODONE) 5cc q4-6 hours as needed cough.  100cc  #1 x 0   Entered and Authorized by:   Eustaquio Boyden  MD   Signed by:   Eustaquio Boyden  MD on 11/07/2009   Method used:   Print then Give to Patient   RxID:   [id number]   Laboratory Results  Date/Time Received: November 07, 2009 9:55 AM  Date/Time Reported: November 07, 2009 10:18 AM   Other Tests  Rapid Strep: negative Comments: ...........test performed  by...........Marland KitchenTerese Door, CMA

## 2010-06-25 NOTE — Assessment & Plan Note (Signed)
Summary: spots on arm,tcb   Vital Signs:  Patient profile:   30 year old female Height:      64 inches Weight:      179 pounds BMI:     30.84 Temp:     98.3 degrees F Pulse rate:   61 / minute BP sitting:   111 / 73  (left arm) Cuff size:   regular  Vitals Entered By: Dennison Nancy RN CC: Work in for spots on arms Is Patient Diabetic? No Pain Assessment Patient in pain? no        Primary Care Provider:  Asher Muir MD  CC:  Work in for spots on arms.  History of Present Illness: 1.  spots on bilateral arms--for at least a year.  reddish.  not itchy or painful.  slightly raised.  no new soaps, detergents or other irritants.  not really bothering her, just worried because not going away.  Habits & Providers  Alcohol-Tobacco-Diet     Tobacco Status: never     Tobacco Counseling: not indicated; no tobacco use  Allergies (verified): 1)  ! Penicillin V Potassium (Penicillin V Potassium)  Review of Systems General:  Denies fever, loss of appetite, and malaise. Derm:  Complains of rash; denies dryness, itching, and poor wound healing.  Physical Exam  General:  Well-developed,well-nourished,in no acute distress; alert,appropriate and cooperative throughout examination Skin:  small raised slightly erythematous papules on bilateral forearms, on dorsal side Additional Exam:  vital signs reviewed    Impression & Recommendations:  Problem # 1:  SKIN RASH (ICD-782.1) Assessment New not certain what this rash is.  offered punch biopsy, but pt declines and prefers referral to derm.  referral placed.  advised pt that it could be long wait before appt; she understands Orders: Dermatology Referral (Derma) [facility]- Est Level  3 (04540)  Patient Instructions: 1)  It was nice to see you today. 2)  We will refer you to dermatology.  If you don't hear from [place] by Friday, call us.

## 2010-06-25 NOTE — Progress Notes (Signed)
Summary: Referral  Phone Note Call from Patient Call back at Home Phone [phone]   Caller: Patient Reason for Call: Referral Summary of Call: pt is requesting a referral to the foot center, seen recently for toe pain, went to urgent care & they advised she see the foot center Initial call taken by: Knox Royalty,  December 04, 2009 10:55 AM  Follow-up for Phone Call        to [facility], saw her foot problems last Follow-up by: Gladstone Pih,  December 04, 2009 11:29 AM  Additional Follow-up for Phone Call Additional follow up Details #1::        toe is now oozing blood and wants to know what to do Additional Follow-up by: De Nurse,  December 05, 2009 11:00 AM    Additional Follow-up for Phone Call Additional follow up Details #2::    was seen for this 11/28/09. 3 days after being seen here it fet worse. she went to [facility]  yesterday. they told her she needed to go to Foot center. 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 would prefer not to come back here. told her md usually has to see her before a referral is done. states the toe is bleeding & has pus. told her I will ask another md & call her back  spoke with Dr. [place]. she will be seen here at 1:30. aware of wait  Follow-up by: Golden Circle RN,  December 05, 2009 11:10 AM

## 2010-06-25 NOTE — Progress Notes (Signed)
Summary: triage  Phone Note Call from Patient Call back at Home Phone [phone]   Caller: Patient Summary of Call: Pt having mood swings and crying spells. Initial call taken by: Clydell Hakim,  Oct 09, 2009 4:24 PM  Follow-up for Phone Call        states sh eis not depressed & denied SI or HI. states she will just start crying. other times she "blanks out" & the person talking to her says "are you listening" pt admits she was not. she is concerned. appt with pcp tomorrow am. told her if any problems call our on call md Follow-up by: Golden Circle RN,  Oct 09, 2009 4:48 PM

## 2010-06-25 NOTE — Progress Notes (Signed)
Summary: pls call  Phone Note Call from Patient Call back at [phone]   Caller: Patient Summary of Call: pt called b/c of letter- pls call Initial call taken by: De Nurse,  July 23, 2009 8:49 AM  Follow-up for Phone Call        explained pap results.  needs colpo.  pt is to call back and schedule [facility].  pt expressed understanding. Follow-up by: Asher Muir MD,  July 23, 2009 12:37 PM

## 2010-06-25 NOTE — Miscellaneous (Signed)
Summary: Procedure Consent  Procedure Consent   Imported By: Bradly Bienenstock 08/14/2009 11:05:29  _____________________________________________________________________  External Attachment:    Type:   Image     Comment:   External Document

## 2010-06-25 NOTE — Consult Note (Signed)
Summary: [facility]   Imported By: De Nurse 01/25/2010 11:42:33  _____________________________________________________________________  External Attachment:    Type:   Image     Comment:   External Document

## 2010-06-25 NOTE — Assessment & Plan Note (Signed)
Summary: cpe/pap,tcb   Vital Signs:  Patient profile:   30 year old female Weight:      186.0 pounds BMI:     31.55 Temp:     98.5 degrees F oral Pulse rate:   82 / minute Pulse rhythm:   regular BP sitting:   114 / 79  (right arm) Cuff size:   large  Vitals Entered By: Loralee Pacas CMA (July 11, 2009 11:01 AM)  Primary Care Provider:  Asher Muir MD  CC:  wt and cpe/pap.  History of Present Illness: Here for cpe.  discussed:  1.  wt.  about 30 pounds heavier since this last pregnancy.  has tried eating small meals, exercising 2-3 times per week.  bought a scale.  has not lost any wt    Current Medications (verified): 1)  None  Allergies: No Known Drug Allergies  Past History:  Past Medical History: hx of depression 11/99 Rx`d with Zoloft, resolved hx of nephrolithiasis NSVD X 3 abnormal pap 2001; colpo normal.  no abnormal paps since  Past Surgical History: IUD insertion 02/04/00 - 11/08 IUD again in Fall 2010  Family History: mother- depression, htn, arthritis sister--htn children--adhd, asthma maternal aunt with breast cancer.  not sure age of diagnosis maternal uncle with lung cancer, gastric cancer  Social History: Quit smoking 1 year ago (2010).  G3P3.  Lives with 3 children and boyfriend.  works at [employer].  no alcohol.  no recreational drugs  Review of Systems  The patient denies weight loss and weight gain.   General:  Denies loss of appetite. GU:  Denies discharge and dysuria.  Physical Exam  General:  Well-developed,well-nourished,in no acute distress; alert,appropriate and cooperative throughout examination Eyes:  fundoscopic exam grossly normal; PERRL Ears:  tms and canals clear Mouth:  Oral mucosa and oropharynx without lesions or exudates.  Teeth in good repair. Neck:  No deformities, masses, or tenderness noted. Lungs:  Normal respiratory effort, chest expands symmetrically. Lungs are clear to auscultation, no  crackles or wheezes. Heart:  Normal rate and regular rhythm. S1 and S2 normal without gallop, murmur, click, rub or other extra sounds. Abdomen:  Bowel sounds positive,abdomen soft and non-tender without masses, organomegaly or hernias noted. Genitalia:  Pelvic Exam:        External: normal female genitalia without lesions or masses        Vagina: normal without lesions or masses        Cervix: normal without lesions or masses; IUD inplace        Adnexa: normal bimanual exam without masses or fullness        Uterus: normal by palpation        Pap smear: performed Msk:  No deformity or scoliosis noted of thoracic or lumbar spine.   Extremities:  No clubbing, cyanosis, edema, or deformity noted  Neurologic:  alert & oriented X3.  alert & oriented X3.   Skin:  Intact without suspicious lesions or rashes Cervical Nodes:  No lymphadenopathy noted Axillary Nodes:  No palpable lymphadenopathy Psych:  Cognition and judgment appear intact. Alert and cooperative with normal attention span and concentration. No apparent delusions, illusions, hallucinations Additional Exam:  vital signs reviewed    Impression & Recommendations:  Problem # 1:  OBESITY (ICD-278.00) Assessment Unchanged wt actually now in obese category.  advised food and exercise diary.  offered nutritional counseling.  she is interested in otc wt loss meds.  advised that alli is probably safe, but  diet and exercise most important  Problem # 2:  Preventive Health Care (ICD-V70.0) she wants pap today.  but I think can go to every 2 years after this  Other Orders: Pap Smear-[facility] ([id number]) GC/Chlamydia-[facility] ([id number])  Patient Instructions: 1)  It was nice to see you today. 2)  For your weight, keep up exercising and eating healthy.   3)  Try keeping a food and exercise diary. 4)  You can try alli, but I think most of your weight loss will come from eating healthy and exercising. 5)  If you want an appointment with our  nutritionist, Dr Gerilyn Pilgrim, call the front office. 6)  Please schedule a follow-up appointment in 1 year for your physical.   Your next pap is due in 2 years  Appended Document: Orders Update    Clinical Lists Changes  Problems: Added new problem of HEALTH MAINTENANCE EXAM (ICD-V70.0) Orders: Added new Test order of [facility] - Est  18-39 yrs [phone]) - Signed

## 2010-06-25 NOTE — Progress Notes (Signed)
Summary: triage  Phone Note Call from Patient Call back at Home Phone [phone]   Caller: Patient Summary of Call: Pt wondering if she can be seen today. She injured her toe. Initial call taken by: Clydell Hakim,  Sep 28, 2009 2:08 PM  Follow-up for Phone Call        stubbed on front door wed am. very sore. hx MRSA . there is still an open area. her mom is making her call & be seen. she will be here by 3:15 at the latests Follow-up by: Golden Circle RN,  Sep 28, 2009 2:42 PM

## 2010-06-25 NOTE — Miscellaneous (Signed)
Summary: [facility]  Clinical Lists Changes rec'd fax from [facility]. pt has appt there 07/07/08. 9am. they wrote that pt is aware.Kennon Rounds Dayzha Pogosyan RN  July 04, 2008 11:23 AM

## 2010-06-25 NOTE — Progress Notes (Signed)
  Phone Note Outgoing Call   Call placed by: Asher Muir MD,  July 17, 2009 5:47 PM Summary of Call: attempted to call patient to inform of abnormal pap results.  no answer.  no voice mail.  she needs to schedule in colpo clinic.  will try to contact her again tomorrow. Initial call taken by: Asher Muir MD,  July 17, 2009 5:47 PM  Follow-up for Phone Call        attempted to call at work and at home.  no answer.  no voice mail.  will send letter and continue to try and contact Follow-up by: Asher Muir MD,  July 18, 2009 2:09 PM  Additional Follow-up for Phone Call Additional follow up Details #1::        attempted to call again.  no answer/no vm.  letter sent yesterday Additional Follow-up by: Asher Muir MD,  July 19, 2009 10:28 AM    Additional Follow-up for Phone Call Additional follow up Details #2::    see phone note from 2/28 Follow-up by: Asher Muir MD,  July 23, 2009 12:37 PM

## 2010-06-25 NOTE — Progress Notes (Signed)
Summary: triage  Phone Note Call from Patient Call back at Home Phone [phone]   Caller: Patient Summary of Call: had part of toenail removed a few months ago and now it is growing out and is very painfil- brown on side of toe and has had MRSA in past Initial call taken by: De Nurse,  November 27, 2009 2:16 PM  Follow-up for Phone Call        was red & pus filled. she squeezed it out. now just sore.  went to UC 1 wk ago. states she has a hx of MRSA. appt made for tomorrow at 8:30am Follow-up by: Golden Circle RN,  November 27, 2009 2:23 PM

## 2010-06-25 NOTE — Assessment & Plan Note (Signed)
Summary: ear & throat pain//[place]   Vital Signs:  Patient profile:   30 year old female Height:      64 inches Weight:      182.2 pounds BMI:     31.39 Temp:     99.9 degrees F oral Pulse rate:   93 / minute BP sitting:   125 / 85  (left arm) Cuff size:   regular  Vitals Entered By: Garen Grams LPN (November 14, 2009 9:28 AM) CC: cough/sore throat/ear pain Is Patient Diabetic? No Pain Assessment Patient in pain? no        Primary Care Provider:  Asher Muir MD  CC:  cough/sore throat/ear pain.  History of Present Illness: 1) Cough, sore throat: Seen on 6/15 for non productive cough, sore thorat x 1 week. Strep negative. Diagnosed as post-infectious cough. Started on Hydromet cough syrup with some improvement in symptoms, but symptoms have continued. Here today with more complaints of above. Robitussin DM (patient ran out of Hydromet) not helping. Also reports some mild ear pain bilaterally.    Denies: itchy eyes, conjunctivitis, rhinorrhea, fevers, chills, nausea, vomiting, diarrhea, headaches, postnasal drip, reflux, wheezing, chest pain, breathing difficulties, rash, association of symptoms with time of day or location.   Habits & Providers  Alcohol-Tobacco-Diet     Tobacco Status: never  Current Medications (verified): 1)  Hydromet 5-1.5 Mg/31ml Syrp (Hydrocodone-Homatropine) .... 5cc Q4-6 Hours As Needed Cough. 100cc  Allergies (verified): 1)  ! Penicillin V Potassium (Penicillin V Potassium)  Social History: Smoking Status:  never  Review of Systems       see above   Physical Exam  General:  alert, NAD, well appearing, vitals reviewed  Eyes:  no conjunctivitis Ears:  TMs clear bilaterally  Nose:  no rhinorrhea or congestion  Mouth:  moist membranes, no erythema or exudate or tonsillar hypertrophy Neck:  no lymphadenopathy   Lungs:  CTAB w/o wheeze or crackles  Heart:  RRR w/o murmurs  Extremities:  no edema    Impression &  Recommendations:  Problem # 1:  COUGH (ICD-786.2) Assessment Unchanged  As before, most likely post-viral cough.  Reassured.  Treat with suppression.  Red flags to return discussed.  Orders: [facility]- Est Level  3 ([id number])  Complete Medication List: 1)  Hydromet 5-1.5 Mg/58ml Syrp (Hydrocodone-homatropine) .... 5cc q4-6 hours as needed cough. 100cc  Other Orders: Rapid Strep-[facility] ([id number])  Patient Instructions: 1)  You have a post-infectious cough.  This can last 4-6 weeks to get better. 2)  Cough syrup as needed. 3)  Please return if not improving as expected, or if high fevers (>101.5) or other concerns. 4)  Call clinic with questions.  Pleasure to see you today! Prescriptions: HYDROMET 5-1.5 MG/5ML SYRP (HYDROCODONE-HOMATROPINE) 5cc Q4-6 hours as needed cough. 100cc  #1 x 0   Entered and Authorized by:   Bobby Rumpf  MD   Signed by:   Bobby Rumpf  MD on 11/14/2009   Method used:   Print then Give to Patient   RxID:   [id number]   Laboratory Results  Date/Time Received: November 14, 2009 9:36 AM  Date/Time Reported: November 14, 2009 9:47 AM   Other Tests  Rapid Strep: negative Comments: ...............test performed by......Marland KitchenBonnie A. [place], MLS (ASCP)cm

## 2010-06-25 NOTE — Assessment & Plan Note (Signed)
Summary: toe problem/[facility]/white team   Vital Signs:  Patient profile:   30 year old female Weight:      183.7 pounds BMI:     31.65 Temp:     98.7 degrees F oral Pulse rate:   84 / minute BP sitting:   109 / 73  (left arm)  Vitals Entered By: Loralee Pacas CMA (December 05, 2009 1:43 PM) CC: toe infection Comments hit toe last night and it started bleding and pus came out   Primary Care Provider:  . [facility]  CC:  toe infection.  History of Present Illness: Work in Visit: Toe infection: Left great toe started hurting 1 month ago. Has been to clinic and UC several times. Currently on Bactrim for 6 days (has 1 day left). No fevers or chills. Pt hit her toe last night and pus came out of it. She wants to see the foot center to have them cut out part of the toe. No fevers or chills, no h/o infection before this. No h/o DM.   Current Medications (verified): 1)  Keflex 500 Mg Caps (Cephalexin) .... Take 1 Tablet 3 Times A Day 7 Days 2)  Pro-Flora Concentrate  Caps (Probiotic Product) .... Take 1 Capsule Once A Day For 10 Days.  Allergies: 1)  ! Penicillin V Potassium (Penicillin V Potassium)  Review of Systems        vitals reviewed and pertinent negatives and positives seen in HPI   Physical Exam  General:  Well-developed,well-nourished,in no acute distress; alert,appropriate and cooperative throughout examination Extremities:  left great toe has erytema on the medial portion of the toe. Pt has tenderness on the medial side.    Impression & Recommendations:  Problem # 1:  CELLULITIS, GREAT TOE (ICD-681.10) Assessment Deteriorated Pt wants to have a referral to podiatry so she can have the nail cut off and the nail bed "killed". She has had an ingrown toenail before but has  never had an infection like this before. Referral done. Will start her on 7 more days of Abx three times a day.   The following medications were removed from the medication list:    Bactrim Ds 800-160  Mg Tabs (Sulfamethoxazole-trimethoprim) ..... One tab by mouth bid x 7days Her updated medication list for this problem includes:    Keflex 500 Mg Caps (Cephalexin) .Marland Kitchen... Take 1 tablet 3 times a day 7 days  Orders: Podiatry Referral (Podiatry) [facility]- Est Level  3 ([address])  Complete Medication List: 1)  Keflex 500 Mg Caps (Cephalexin) .... Take 1 tablet 3 times a day 7 days 2)  Pro-flora Concentrate Caps (Probiotic product) .... Take 1 capsule once a day for 10 days.  Patient Instructions: 1)  We will call you for a foot center appointment  Prescriptions: PRO-FLORA CONCENTRATE  CAPS (PROBIOTIC PRODUCT) take 1 capsule once a day for 10 days.  #20 x 0   Entered and Authorized by:   Jamie Brookes MD   Signed by:   Jamie Brookes MD on 12/05/2009   Method used:   Electronically to        [facility] (retail)       [address]       [place], [place]  [address]       Ph: [phone] or [phone]       Fax: [phone]   RxID:   [id number] KEFLEX 500 MG CAPS (CEPHALEXIN) take 1 tablet 3 times a day 7 days  #21 x 0  Entered and Authorized by:   Jamie Brookes MD   Signed by:   Jamie Brookes MD on 12/05/2009   Method used:   Electronically to        [facility] (retail)       [address]       [place], [place]  [address]       Ph: [phone] or [phone]       Fax: [phone]   RxID:   [id number] PRO-FLORA CONCENTRATE  CAPS (PROBIOTIC PRODUCT) take 1 capsule once a day for 10 days.  #20 x 0   Entered and Authorized by:   Jamie Brookes MD   Signed by:   Jamie Brookes MD on 12/05/2009   Method used:   [phone][facility]ail[facility]       [address] [place], [place]  [address]       Ph: [phone] or [phone]       Fax: [phone]   RxID:   [id number] KEFLEX 500 MG CAPS (CEPHALEXIN) take 1 tablet 3 times a day 7 days  #21 x 0   Entered and Authorized by:   Jamie Brookes MD   Signed by:   Jamie Brookes MD on 12/05/2009   Method used:   [facility]        [facility] [phone][facility] (retail[facility]       [address]       [place], [place]  [address]       Ph: [phone] or [phone]       Fax: [phone]   RxID:   [id number]  appt made for 07.14.2011@ 845am with Dr. Wynelle Cleveland orders faxed to [phone]. pt informed and agreed.Marland KitchenMarland KitchenLoralee Pacas CMA  December 05, 2009 2:42 PM

## 2010-06-25 NOTE — Assessment & Plan Note (Signed)
Summary: stubbed toe/torn nail   Vital Signs:  Patient profile:   30 year old female Weight:      183.9 pounds Temp:     98.2 degrees F oral Pulse rate:   73 / minute Pulse rhythm:   regular BP sitting:   118 / 75  (left arm) Cuff size:   large  Vitals Entered By: Loralee Pacas CMA (Sep 28, 2009 3:45 PM) CC: stubbed left little toe Comments last tetnus done 02/02/2008   Primary Care Provider:  Asher Muir MD  CC:  stubbed left little toe.  History of Present Illness: Pt opened  door and it took off some of the little toe nail on her left foot. It is painful to touch, but she can walk on it fine while wearing flip-flops. She has a hard time wearing closed toe shoes b/c it rubs the bandaid off. It is not bleeding. She has a h/o MRSA and so she wanted to come to get it checked out. Injury happened 2 days ago.   Current Medications (verified): 1)  None  Allergies (verified): No Known Drug Allergies  Review of Systems        vitals reviewed and pertinent negatives and positives seen in HPI   Physical Exam  General:  Well-developed,well-nourished,in no acute distress; alert,appropriate and cooperative throughout examination Msk:  left foot, little toe has some nail torn off, no lifting of the rest of the nail, brisk cap refill, no bleeding, not tender along bony part of toe or lateral side of foot.

## 2010-06-25 NOTE — Progress Notes (Signed)
Summary: phn msg  Phone Note Call from Patient Call back at Home Phone [phone]   Caller: Patient Summary of Call: needs to talk to nurse about Mirena  Initial call taken by: De Nurse,  September 03, 2009 8:54 AM  Follow-up for Phone Call        lm Follow-up by: Golden Circle RN,  September 03, 2009 9:17 AM  Additional Follow-up for Phone Call Additional follow up Details #1::        c/o mood swings. crying over nothing. Hx depression. not on meds. agreed to come in this am to discuss with md. told her unlikely IUD is causing this. she has had the iud since 03/02/09 Additional Follow-up by: Golden Circle RN,  September 03, 2009 9:42 AM

## 2010-06-25 NOTE — Assessment & Plan Note (Signed)
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 Has had one or two abnormals before and maybe a colposcopy. Asymptomatic. had her third child in August and has IUD in place.  Allergies: No Known Drug Allergies  Physical Exam  Genitalia:  normal introitus, no external lesions, no vaginal discharge, mucosa pink and moist, and no vaginal or cervical lesions.  IUD strings observed exiting from os. Were still in place after procedure. Additional Exam:  Patient given informed consent, signed copy in the chart. Placed in lithotomy position. Cervix viewed with speculum and colposcope. Was the entire squamocolumnar junction seen?yes Any acetowhite lesions noted?no Any abnormalities seen with green filter?no Any abnormalities seen with application of Lugol's solution?no Was the endocervical canal sampled?no Were any cervical biopsies taken?no Were there any complications?no COMMENTS: Patient was given post procedure instructions.     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 repeat pap in one year.  Orders: Colposcopy w/out biopsy [facility] ([id number])  Appended Document: colpo,tcb    Clinical Lists Changes  Observations: Added new observation of PAP DUE: 07/11/2010 (08/22/2009 13:58) Added new observation of DM PROGRESS: N/A (08/22/2009 13:58) Added new observation of DM FSREVIEW: N/A (08/22/2009 13:58) Added new observation of HTN PROGRESS: N/A (08/22/2009 13:58) Added new observation of HTN FSREVIEW: N/A (08/22/2009 13:58) Added new  observation of LIPID PROGRS: N/A (08/22/2009 13:58) Added new observation of LIPID FSREVW: N/A (08/22/2009 13:58) Added new observation of COLPO: normal:   (08/09/2009 13:58)       Colposcopy  Procedure date:  08/09/2009  Findings:      normal:      Prevention & Chronic Care Immunizations   Influenza vaccine: Not documented    Tetanus booster: 02/02/2008: Tdap   Tetanus booster due: 02/01/2018    Pneumococcal vaccine: Not documented  Other Screening   Pap smear: LOW GRADE SQUAMOUS INTRAEPITHELIAL LESION: CIN-1/ VAIN-1/ HPV. (LSIL)  (07/11/2009)   Pap smear due: 07/11/2010   Smoking status: never  (03/02/2009)    Screening comments: coloposcopy 08/09/09 was clinically normal

## 2010-06-25 NOTE — Letter (Signed)
Summary: Generic Letter  [facility]  [address]   [place], [place] [address]   Phone: [phone]  Fax: [phone]    07/18/2009  Katherine Fritz [address] [place], [place]  [address]  Dear [name],   I wanted to let you know that your pap smear had some abnormal cells.  I tried to call you several times, but was unable to reach you by phone.  Please call the office so that we can discuss your results.  I look forward to talking with you.          Sincerely,   Asher Muir MD  Appended Document: Generic Letter mailed.

## 2010-06-25 NOTE — Progress Notes (Signed)
Summary: test results  Phone Note Call from Patient Call back at Home Phone [phone]   Caller: Patient Summary of Call: wants to know test results Initial call taken by: De Nurse,  March 22, 2010 2:36 PM  Follow-up for Phone Call        will forward to MD. Follow-up by: Theresia Lo RN,  March 22, 2010 3:05 PM  Additional Follow-up for Phone Call Additional follow up Details #1::        GC/Chlam both neg. Additional Follow-up by: Rodney Langton MD,  March 22, 2010 10:48 PM    Additional Follow-up for Phone Call Additional follow up Details #2::    Spoke with patient and informed her that GC/Chlam are neg. Follow-up by: Jimmy Footman, CMA,  March 25, 2010 10:02 AM

## 2010-06-25 NOTE — Miscellaneous (Signed)
Summary: at [facility]  Clinical Lists Changes staff at [facility] called for our NPI. pt presented there with foot/ankle injury & the md is already seeing her. gave the ok to use our number.Golden Circle RN  December 25, 2009 11:09 AM

## 2010-06-25 NOTE — Progress Notes (Signed)
Summary: Triage call  Phone Note Call from Patient   Summary of Call: Pt states that she saw what looked like a part of a tampon string come out of her this am while she was in the shower.  Pt just finished her period and remembers putting a tampon in yesterday but does not rememebr if she removed it.  She tried to feel with her fingers and felt something but doesn't know if it's a tampon or the strings to her IUD.  Told her to come in tomorrow and and we would do a pelvic evaluation.  [place] appt made for 8:30 am. Initial call taken by: Dennison Nancy RN,  March 20, 2010 4:01 PM

## 2010-06-25 NOTE — Miscellaneous (Signed)
Summary: Consent: Toenail Removal  Consent: Toenail Removal   Imported By: Knox Royalty 11/28/2009 12:55:01  _____________________________________________________________________  External Attachment:    Type:   Image     Comment:   External Document

## 2010-06-25 NOTE — Assessment & Plan Note (Signed)
Summary: ? tampon stuck   FLU SHOT GIVEN TODAY.Jimmy Footman, CMA  March 21, 2010 9:11 AM  Vital Signs:  Patient profile:   30 year old female Height:      64 inches Weight:      192.56 pounds BMI:     33.17 BSA:     1.93 Temp:     98.7 degrees F Pulse rate:   89 / minute BP sitting:   117 / 79  Vitals Entered By: Jone Baseman CMA (March 21, 2010 8:42 AM)   Primary Care Provider:  . [facility]  CC:  ? tampon stuck.  History of Present Illness: 30 yo female comes in with suspicion of tampon stuck in vagina.  Finished her period 2d ago and thinks a tampon has been stuck in for about that amt of time.  Though she felt a tampon string while shaving.  Had BF check but he just found IUD strings.  No fevers/chills/rash, mild pelvic cramping, no dysuria, no vaginal irritation or discharge.  No N/V/D/C.   Current Medications (verified): 1)  Keflex 500 Mg Caps (Cephalexin) .... Take 1 Tablet 3 Times A Day 7 Days 2)  Pro-Flora Concentrate  Caps (Probiotic Product) .... Take 1 Capsule Once A Day For 10 Days.  Allergies (verified): 1)  ! Penicillin V Potassium (Penicillin V Potassium)   Review of Systems       See HPI   Physical Exam  General:  Well-developed,well-nourished,in no acute distress; alert,appropriate and cooperative throughout examination Abdomen:  Bowel sounds positive,abdomen soft and non-tender without masses, organomegaly or hernias noted. Genitalia:  Normal introitus for age, no external lesions, no vaginal discharge, mucosa pink and moist, no vaginal or cervical lesions however there was some yellowish/white discharge from cervix, no vaginal atrophy, no friaility or hemorrhage, normal uterus size and position, no adnexal masses or tenderness, no CMT.  IUD strings seen, no other foreign bodies seen.   Impression & Recommendations:  Problem # 1:  ? of FOREIGN BODY IN VULVA AND VAGINA (ICD-939.2) Assessment New  No foreign body or tampons seen, IUD  strings in place. No fevers/chills/rash to suggest recent toxic shock syndrome. See #2.  Orders: [facility]- Est  Level 4 ([address])  Problem # 2:  VAGINITIS (ICD-616.10) She did have cervical discharge to GC/Chlam and WP collected Will call pt with results, will not treat for GC/Chlam empirically as no CMT.  The following medications were removed from the medication list:    Keflex 500 Mg Caps (Cephalexin) .Marland Kitchen... Take 1 tablet 3 times a day 7 days  Orders: Wet Prep- [facility] [phone]) GC/Chlamydia-[facility] (87591/87491) [facility]- Est  Level 4 ([address])  Other Orders: Flu Vaccine 53yrs + ([address]) Admin 1st Vaccine ([address])  CC: ? tampon stuck Is Patient Diabetic? No Pain Assessment Patient in pain? no        Laboratory Results  Date/Time Received: March 21, 2010 9:01 AM  Date/Time Reported: March 21, 2010 9:11 AM   [employer] Source: vaginal WBC/hpf: 5-10 Bacteria/hpf: 3+  Rods Clue cells/hpf: none  Negative whiff Yeast/hpf: none Trichomonas/hpf: none Comments: ...........test performed by...........Marland KitchenTerese Door, CMA      Immunizations Administered:  Influenza Vaccine # 1:    Vaccine Type: Fluvax 3+    Site: right deltoid    Mfr: GlaxoSmithKline    Dose: 0.5 ml    Route: IM    Given by: Jimmy Footman, CMA    Exp. Date: 11/20/2010    Lot #: [id number]    VIS  given: 12/18/09 version given March 21, 2010.  Flu Vaccine Consent Questions:    Do you have a history of severe allergic reactions to this vaccine? no    Any prior history of allergic reactions to egg and/or gelatin? no    Do you have a sensitivity to the preservative Thimersol? no    Do you have a past history of Guillan-Barre Syndrome? no    Do you currently have an acute febrile illness? no    Have you ever had a severe reaction to latex? no    Vaccine information given and explained to patient? yes    Are you currently pregnant? no

## 2010-06-25 NOTE — Progress Notes (Signed)
Summary: referral  Phone Note Call from Patient Call back at Home Phone [phone]   Reason for Call: Talk to Nurse Summary of Call: pt requesting referral to dermatologist Initial call taken by: Knox Royalty,  October 24, 2009 4:12 PM  Follow-up for Phone Call        to PCP Follow-up by: Gladstone Pih,  October 24, 2009 4:13 PM  Additional Follow-up for Phone Call Additional follow up Details #1::        I don't recall discussing skin issues recently.  could you find out what the issue is?  If we have not discussed it before, she needs an appointment.  Thanks! Additional Follow-up by: Asher Muir MD,  October 25, 2009 3:05 PM    Additional Follow-up for Phone Call Additional follow up Details #2::    pt stated that she spoke with you at another appt concerning the spots on her arms, I told her to call and make appt to be seen b/c i did not see any note of this in her chart Follow-up by: Loralee Pacas CMA,  October 25, 2009 3:33 PM

## 2010-06-25 NOTE — Assessment & Plan Note (Signed)
Summary: crying spells/[place]   Vital Signs:  Patient profile:   30 year old female Weight:      184.2 pounds BMI:     31.73 Temp:     98.0 degrees F Pulse rate:   71 / minute BP sitting:   117 / 77  (right arm)  Vitals Entered By: Starleen Blue RN (Oct 10, 2009 9:06 AM) CC: mood swings Is Patient Diabetic? No Pain Assessment Patient in pain? no        Primary Care Provider:  Asher Muir MD  CC:  mood swings.  History of Present Illness: 1.  mood swings--Seen on 4/11and 5/11  to discuss mood swings. (see ov notes  for more details).  Still having mood swings, but they are becoming more frequent.  she feels that it is time to take action.  she reports good appetite and sleep.  does not feel depressed.  no anhedonia.  no thoughts of hurting herself.  does have some mild irritability.  no wt loss.    Habits & Providers  Alcohol-Tobacco-Diet     Tobacco Status: never  Allergies: No Known Drug Allergies  Physical Exam  General:  Well-developed,well-nourished,in no acute distress; alert,appropriate and cooperative throughout examination Psych:  Oriented X3, memory intact for recent and remote, normally interactive, good eye contact, not anxious appearing, and not depressed appearing.   Additional Exam:  vital signs reviewed    Impression & Recommendations:  Problem # 1:  MOOD SWINGS (ICD-296.99) Assessment Deteriorated  I think that the most likelyl explanation is still the Mirena IUD.  she does not meet criteria for depression.  think we should remove IUD.  she will consider replacing with copper IUD vs condom use since they plan to start trying for another baby some time next year.   discussed with Dr Mauricio Po, who agrees with plan.    Orders: [facility]- Est Level  3 ([address])  Patient Instructions: 1)  It was nice to see you today. 2)  Make an appointment to have your IUD removed.  Let us know what you decide about having a copper IUD put in.

## 2010-06-25 NOTE — Progress Notes (Signed)
Summary: triage  Phone Note Call from Patient Call back at Home Phone [phone]   Caller: Patient Summary of Call: came in last week for cough and now throat is hurting and ear hurts Initial call taken by: De Nurse,  November 14, 2009 8:41 AM  Follow-up for Phone Call        states even the cough is worse. she will be in now to see workin md Follow-up by: Golden Circle RN,  November 14, 2009 8:43 AM

## 2010-06-25 NOTE — Assessment & Plan Note (Signed)
Summary: toe nail pain/[place]/white team   Vital Signs:  Patient profile:   30 year old female Height:      64 inches Weight:      182 pounds BMI:     31.35 Temp:     98.7 degrees F oral Pulse rate:   75 / minute BP sitting:   112 / 76  (left arm) Cuff size:   regular  Vitals Entered By: Tessie Fass CMA (November 28, 2009 8:48 AM) CC: left great toe pain Is Patient Diabetic? No Pain Assessment Patient in pain? yes     Location: left great  toe   Primary Care Provider:  Asher Muir MD  CC:  left great toe pain.  History of Present Illness: 1) Ingrown toenail: Here for sda for ingrown toenail.  Been bothering her >1 month.  Left great toe, medial. Reports mild redness and swelling, also some green discharge 1 week ago, moderately tender.  Had partial nail avulsion with Dr. Lafonda Mosses in Dec 2010 w/o phenolization. Denies fever, chills, emesis, nausea.   see prior meds for med rec   Medications Prior to Update: 1)  Hydromet 5-1.5 Mg/8ml Syrp (Hydrocodone-Homatropine) .... 5cc Q4-6 Hours As Needed Cough. 100cc  Allergies (verified): 1)  ! Penicillin V Potassium (Penicillin V Potassium)  Physical Exam  General:  Well-developed,well-nourished,in no acute distress; alert,appropriate and cooperative throughout examination Extremities:  left great toe with mild paronychia w/ purulence medially. mild redness and swelling. ingrown nail medially.    Impression & Recommendations:  Problem # 1:  INGROWN TOENAIL (ICD-703.0) Assessment Deteriorated Nail has become ingrown again. Patient opts for more conservative treatment at this time (see procedure note). I+D paronychia. Patient to attempt to elevate nail with cotton at home. If these attempts are unsucessful, she fwill follow up for partial nail avulsion and phenolization of the matrix to prevent regrowth. Will treat with Bactrim at this time to help reduce infection / swelling to aid attempts. Reviewed red flags.  Her updated  medication list for this problem includes:    Bactrim Ds 800-160 Mg Tabs (Sulfamethoxazole-trimethoprim) ..... One tab by mouth bid x 7days  Complete Medication List: 1)  Bactrim Ds 800-160 Mg Tabs (Sulfamethoxazole-trimethoprim) .... One tab by mouth bid x 7days  Other Orders: Debridement of Nail any method 1-5 ([address])  Patient Instructions: 1)  Follow the instructions on how to take care of your toenail. 2)  Take the antibiotic as prescribed.  3)  Follow up if not improving in 2-3 weeks of conservative treatment for partial nail removal.  Prescriptions: BACTRIM DS 800-160 MG TABS (SULFAMETHOXAZOLE-TRIMETHOPRIM) one tab by mouth BID x 7days  #14 x 0   Entered and Authorized by:   Bobby Rumpf  MD   Signed by:   Bobby Rumpf  MD on 11/28/2009   Method used:   Electronically to        [facility]       [address]       [place], [place]  [address]       Ph: [phone] or [phone]       Fax: [phone]   RxID:   [id number]    Procedure Note Last Tetanus: Tdap (02/02/2008)  Nail Treatment: Indication: paronychia  Procedure # 1: I & D paronychia, nail debridement     Region: medial    Location: left great toe     Technique: #15 blade    Anesthesia: 2% lidocaine w/o epinephrine    Comment:  procedure discussed - patient elects for more conservative measures at this time (does not want partial nail avulsion and phenolization at this time). Consent obtained. Time out performed. Area prepped w/ alcohol prior to digital block as above. Block w/lidocaine w/o epi as above. Nail trimmed, paronychia I+D w/ green purulent discharge. Tolerated procedure well/ .   Cleaned and prepped with: betadine Wound dressing: bacitracin

## 2010-06-25 NOTE — Assessment & Plan Note (Signed)
Summary: removal of iud and reinsert of copper one/[facility]   Vital Signs:  Patient profile:   30 year old female Height:      64 inches Weight:      185.7 pounds BMI:     31.99 Temp:     98.9 degrees F oral Pulse rate:   82 / minute BP sitting:   104 / 68  (left arm) Cuff size:   regular  Vitals Entered By: Gladstone Pih (November 20, 2009 2:48 PM) CC: remove and reinsert IUD Is Patient Diabetic? No Pain Assessment Patient in pain? no        Primary Care Provider:  Asher Muir MD  CC:  remove and reinsert IUD.  History of Present Illness: Here for removal of mirena IUD and insertion of copper IUD because of presumed side effects from mirena.  see previous notes for details.  Habits & Providers  Alcohol-Tobacco-Diet     Tobacco Status: quit     Tobacco Counseling: to quit use of tobacco products     Year Quit: 2010  Allergies: 1)  ! Penicillin V Potassium (Penicillin V Potassium)  Social History: Smoking Status:  quit  Physical Exam  General:  Well-developed,well-nourished,in no acute distress; alert,appropriate and cooperative throughout examination Genitalia:  Pelvic Exam:        External: normal female genitalia without lesions or masses        Vagina: normal without lesions or masses        Cervix: normal without lesions or masses; some blood from os        Adnexa: normal bim Additional Exam:  vital signs reviewed    Impression & Recommendations:  Problem # 1:  CONTRACEPTIVE MANAGEMENT (ICD-V25.09) Assessment Unchanged  Procedure note:  Risks and benefits explained.  consent obtained.  Cervix prepped with betadine.  Uterus sounded.  Paraguard IUD placed without complication.  strings cut  [facility] (82423[facility]  Complete Medication List: 1)  Hydromet 5-1.5 Mg/60ml Syrp (Hydrocodone-homatropine) .... 5cc q4-6 hours as needed cough. 100cc  Patient Instructions: 1)  It was nice to see you today. 2)  Call us if you  have any problems. 3)  If you have fevers, increasing bleeding, abdominal pain seek medical attention.  Appended Document: removal of iud and reinsert of copper one/[facility]c

## 2010-06-25 NOTE — Assessment & Plan Note (Signed)
Summary: FU/KH   Vital Signs:  Patient profile:   30 year old female Weight:      184.5 pounds Temp:     98.5 degrees F oral Pulse rate:   80 / minute BP sitting:   110 / 71  (right arm) Cuff size:   regular  Vitals Entered By: Loralee Pacas CMA (Oct 03, 2009 8:52 AM) CC: follow-up visit Is Patient Diabetic? No   Primary Care Provider:  Asher Muir MD  CC:  follow-up visit.  History of Present Illness: Seen on 4/11 to discuss mood swings. (see ov note from that date for more details).  Still having mood swings where she cries for a few minutes, but is not truly sad.  Happens about once a day to every other day.  not really affecting her functioning at work or home.  Still states that she is not depressed.  She has been depressed in hte past and this is very different.  Does have a lot of stressors right now, but mostly good ones:  she has moved recently.  she is planning her wedding.  has 3 kids, one of whom is 36 months old.  Habits & Providers  Alcohol-Tobacco-Diet     Tobacco Status: never  Allergies: No Known Drug Allergies  Social History: Smoking Status:  never  Review of Systems  The patient denies anorexia and weight loss.   Psych:  Denies alternate hallucination ( auditory/visual), anxiety, depression, easily angered, panic attacks, sense of great danger, suicidal thoughts/plans, and thoughts of violence; ocassional irritability.  Physical Exam  General:  Well-developed,well-nourished,in no acute distress; alert,appropriate and cooperative throughout examination Psych:  Oriented X3, memory intact for recent and remote, normally interactive, good eye contact, not anxious appearing, and not depressed appearing.  did get tearful very briefly when discussing this   Impression & Recommendations:  Problem # 1:  MOOD SWINGS (ICD-296.99) Assessment Unchanged  had long discussion about what to do.  Again, I agree that she is not depressed.  Perhaps mood swings  are hormonal.  Could be related to mirena, but that is not at all certain.  For now, she elects to do watchful waiting.  If it starts to bother her more, may consider removing mirena and inserting copper iud  Orders: [facility]- Est Level  3 (81191)  Patient Instructions: 1)  It was nice to see you today. 2)  Call me if your mood swings start bothering you more.

## 2010-06-25 NOTE — Assessment & Plan Note (Signed)
Summary: depression/[place]   Vital Signs:  Patient profile:   30 year old female Weight:      184 pounds Pulse rate:   80 / minute BP sitting:   115 / 76  (right arm)  Vitals Entered By: Arlyss Repress CMA, (September 03, 2009 1:48 PM) CC: VIDEO PRECEPTING: yes. (verbal concent given) mood swings x 78month. pt has been on Prozac and Zoloft in the past for depression. Is Patient Diabetic? No Pain Assessment Patient in pain? no        Primary Care Provider:  Asher Muir MD  CC:  VIDEO PRECEPTING: yes. (verbal concent given) mood swings x 78month. pt has been on Prozac and Zoloft in the past for depression.Marland Kitchen  History of Present Illness: 1.  mood swings.  past month, but worse the past week.  crying for no reason.  does not think she is depressed.  sleeping OK, appetite good.  no guilt or hopelessness, no anhedonia.  mild irritability, but sounds like within the realm of normal.  no thoughts of self harm.  has been depressed in the past, but this is different.  not really sad about anything in particular.  wonders if the mirena could be causing (mirena placed about 6 months ago.    Habits & Providers  Alcohol-Tobacco-Diet     Tobacco Status: quit  Current Medications (verified): 1)  None  Allergies: No Known Drug Allergies  Social History: Smoking Status:  quit  Review of Systems General:  Denies fatigue, fever, and loss of appetite; mild fatigue at times. Derm:  no skin changes. Psych:  Complains of easily tearful and irritability; denies alternate hallucination ( auditory/visual), anxiety, depression, easily angered, panic attacks, sense of great danger, and suicidal thoughts/plans. Endo:  Denies cold intolerance, excessive hunger, excessive thirst, and heat intolerance.  Physical Exam  General:  Well-developed,well-nourished,in no acute distress; alert,appropriate and cooperative throughout examination Lungs:  Normal respiratory effort, chest expands symmetrically.  Lungs are clear to auscultation, no crackles or wheezes. Heart:  Normal rate and regular rhythm. S1 and S2 normal without gallop, murmur, click, rub or other extra sounds. Psych:  Cognition and judgment appear intact. Alert and cooperative with normal attention span and concentration. No apparent delusions, illusions, hallucinations Additional Exam:  vital signs reviewed    Impression & Recommendations:  Problem # 1:  MOOD SWINGS (ICD-296.99) Assessment New  not really sure what the likely cause is.  does not meet criteria for depression.  mirena is a possible cause, but seems unlikely given the timing.  check upreg, tsh, cbc.  f/u in one month.  sooner if getting worse.    Orders: [facility]- Est Level  3 (16109)  Other Orders: U Preg-[facility] (60454) CBC-[facility] (09811) TSH-[facility] ([id number])  Patient Instructions: 1)  It was nice to see you today. 2)  I am not sure what is causing your mood swings. 3)  I will call you with lab results [phone]) 4)  Please schedule a follow-up appointment in 1 month.   Laboratory Results   Urine Tests  Date/Time Received: September 03, 2009 2:46 PM  Date/Time Reported: September 03, 2009 2:57 PM     Urine HCG: negative Comments: ...............test performed by......Marland KitchenBonnie A. [place], MLS (ASCP)cm

## 2010-07-01 ENCOUNTER — Other Ambulatory Visit: Payer: Self-pay | Admitting: Family Medicine

## 2010-07-01 ENCOUNTER — Other Ambulatory Visit (HOSPITAL_COMMUNITY)
Admission: RE | Admit: 2010-07-01 | Discharge: 2010-07-01 | Disposition: A | Discharge status: Home / Self Care | Payer: Medicaid Other | Source: Ambulatory Visit | Attending: Family Medicine | Admitting: Family Medicine

## 2010-07-01 ENCOUNTER — Encounter: Payer: Self-pay | Admitting: Family Medicine

## 2010-07-01 ENCOUNTER — Encounter (INDEPENDENT_AMBULATORY_CARE_PROVIDER_SITE_OTHER): Payer: Medicaid Other | Admitting: Family Medicine

## 2010-07-01 DIAGNOSIS — R8789 Other abnormal findings in specimens from female genital organs: Secondary | ICD-10-CM

## 2010-07-01 DIAGNOSIS — E669 Obesity, unspecified: Secondary | ICD-10-CM

## 2010-07-01 DIAGNOSIS — Z0000 Encounter for general adult medical examination without abnormal findings: Secondary | ICD-10-CM

## 2010-07-01 DIAGNOSIS — Z124 Encounter for screening for malignant neoplasm of cervix: Secondary | ICD-10-CM | POA: Insufficient documentation

## 2010-07-01 DIAGNOSIS — N760 Acute vaginitis: Secondary | ICD-10-CM

## 2010-07-01 LAB — CONVERTED CEMR LAB
Alkaline Phosphatase: 62 units/L (ref 39–117)
BUN: 10 mg/dL (ref 6–23)
Glucose, Bld: 87 mg/dL (ref 70–99)
HDL: 61 mg/dL (ref >39)
Hemoglobin: 12.6 g/dL (ref 12.0–15.0)
LDL Cholesterol: 88 mg/dL (ref 0–99)
MCHC: 32.6 g/dL (ref 30.0–36.0)
MCV: 88.5 fL (ref 78.0–100.0)
RBC: 4.36 M/uL (ref 3.87–5.11)
Total Bilirubin: 0.4 mg/dL (ref 0.3–1.2)
Total CHOL/HDL Ratio: 2.7
Triglycerides: 83 mg/dL (ref <150)
VLDL: 17 mg/dL (ref 0–40)

## 2010-07-01 LAB — COMPREHENSIVE METABOLIC PANEL
ALT: 9 U/L (ref 0–35)
AST: 12 U/L (ref 0–37)
Creat: 0.68 mg/dL (ref 0.40–1.20)
Total Bilirubin: 0.4 mg/dL (ref 0.3–1.2)

## 2010-07-01 LAB — CBC
MCH: 28.9 pg (ref 26.0–34.0)
MCHC: 32.6 g/dL (ref 30.0–36.0)
MCV: 88.5 fL (ref 78.0–100.0)
Platelets: 345 10*3/uL (ref 150–400)
RDW: 13.6 % (ref 11.5–15.5)

## 2010-07-01 LAB — LIPID PANEL
Cholesterol: 166 mg/dL (ref 0–200)
Total CHOL/HDL Ratio: 2.7 Ratio
VLDL: 17 mg/dL (ref 0–40)

## 2010-07-01 LAB — SYPHILIS: RPR W/REFLEX TO RPR TITER AND TREPONEMAL ANTIBODIES, TRADITIONAL SCREENING AND DIAGNOSIS ALGORITHM

## 2010-07-02 ENCOUNTER — Encounter: Payer: Self-pay | Admitting: Family Medicine

## 2010-07-11 NOTE — Assessment & Plan Note (Signed)
Summary: cpe/pap,df   Vital Signs:  Patient profile:   30 year old female Height:      64 inches Weight:      189.1 pounds BMI:     32.58 Temp:     98.4 degrees F oral Pulse rate:   64 / minute BP sitting:   116 / 74  (left arm) Cuff size:   regular  Vitals Entered By: Jimmy Footman, CMA (July 01, 2010 10:18 AM) CC: CPE Is Patient Diabetic? No Pain Assessment Patient in pain? no        Primary Care Provider:  . [facility]  CC:  CPE.  History of Present Illness: 30 yo here for CPE  vaginitis: mucous discharge prior to menses.  Sexually active with new husband, monogamous.    no dysuria, dyspareunia, non odorous.     LMP:  last week Contraception: copper IUD, desires fertility in next few years Regular Menses: yes Hx of Anemia: no  FHx of Breast, Uterine, Cervical or Ovarian Cancer:  No Last Pap:  Feb 2010, LGSIL Hx of Abnormal Pap: yes Desires STD testing: yes Last Mammogram:  none Abnormalities on Self-exam:  No Hx of Abnormal Mammogram:  No  Obesity:  difficulty losing weight after last pregnancy.  Has been cutting out caloric drinks, exercising more.   Habits & Providers  Alcohol-Tobacco-Diet     Tobacco Status: quit  Current Medications (verified): 1)  None  Allergies: 1)  ! Penicillin V Potassium (Penicillin V Potassium) PMH-FH-SH reviewed for relevance  Review of Systems      See HPI  Physical Exam  General:  Well-developed,well-nourished,in no acute distress; alert,appropriate and cooperative throughout examination Nose:  no rhinorrhea or congestion  Mouth:  moist membranes, no erythema or exudate or tonsillar hypertrophy Neck:  no lymphadenopathy   Lungs:  CTAB w/o wheeze or crackles  Heart:  Normal rate and regular rhythm. S1 and S2 normal without gallop, murmur, click, rub or other extra sounds. Abdomen:  Bowel sounds positive,abdomen soft and non-tender without masses, organomegaly or hernias noted. Genitalia:  Pelvic Exam:   External: normal female genitalia without lesions or masses        Vagina: normal without lesions or masses        Cervix: normal without lesions or masses. IUD strings visualized        Adnexa: normal bimanual exam without masses or fullness        Uterus: normal by palpation        Pap smear: performed Extremities:  no LE edema Neurologic:  alert & oriented X3, cranial nerves II-XII intact, and gait normal.     Impression & Recommendations:  Problem # 1:  HEALTH MAINTENANCE EXAM (ICD-V70.0)  Discussed weight loss, exercise.  Has never had fasting blod work checked.  Will follow-up in 1 year or sooner if needed.  Orders: [facility] - Est  18-39 yrs (04540)  Problem # 2:  ABNORMAL PAP SMEAR, LGSIL (ICD-795.09) aprox one eyar since last pap smear.  Will recheck today,.  Orders: Pap Smear-[facility] ([id number]) [facility] - Est  18-39 yrs (95621)  Problem # 3:  OBESITY (ICD-278.00) Discussed nutrition, offered return visit to discuss in more detail.  Orders: Comp Met-[facility] [phone]) Lipid-[facility] [phone]) [facility] - Est  18-39 yrs [phone])  Other Orders: CBC-[facility] (27253) HIV-[facility] [phone]) RPR-[facility] [id number])  Patient Instructions: 1)  Consider taking a daily multivitamin or prenatal vitamin 2)  Let me know if there is anything else I can do to help!  3)  I will mail you if your labwork is normal, otherwise will call. 4)  Please schedule follow-up if you would like to talk about weight loss and healthy nutrition in more detail 5)  Follow-up in 1 yearor sooner if needed.   Orders Added: 1)  Comp Met-[facility] [[id number]] 2)  Lipid-[facility] [[id number]] 3)  CBC-[facility] [85027] 4)  HIV-[facility] [[id number]] 5)  RPR-[facility] [[id number]] 6)  Pap Smear-[facility] [[id number]] 7)  [facility] - Est  18-39 yrs [[id number]]  Appended Document: cpe/pap,df   Appended Document: wet prep report    Lab Visit  Laboratory Results  Date/Time Received: July 01, 2010 10:57 AM  Date/Time Reported: July 01, 2010 6:59  PM   [employer] Source: vag WBC/hpf: >20 Bacteria/hpf: 2+ rods and cocci Clue cells/hpf: few  Negative whiff Yeast/hpf: none Trichomonas/hpf: none Comments: RBC's present ...............test performed by......Marland KitchenBonnie A. [place], MLS (ASCP)cm   Orders Today:

## 2010-07-11 NOTE — Letter (Signed)
Summary: Results Follow-up Letter  [facility]  [address]   [place], [place] [address]   Phone: [phone]  Fax: [phone]    07/02/2010  [address] [place], [place]  [address]  [place]  Dear [name],   The following are the results of your recent test(s):  Test     Result     Pap Smear    Normal  The rest of your labwork which included gonorrhea, chlamydia, HIV, syphillis, hemoglobin, fasting blood glucose, and cholesterol all looked good.  Please let us know if you have further questions.  Sincerely,  Delbert Harness MD [facility]

## 2010-08-11 LAB — CULTURE, ROUTINE-ABSCESS

## 2010-08-22 ENCOUNTER — Ambulatory Visit: Payer: Medicaid Other | Attending: Family Medicine

## 2010-08-22 ENCOUNTER — Ambulatory Visit: Payer: Medicaid Other

## 2010-08-22 DIAGNOSIS — M25673 Stiffness of unspecified ankle, not elsewhere classified: Secondary | ICD-10-CM | POA: Insufficient documentation

## 2010-08-22 DIAGNOSIS — M6281 Muscle weakness (generalized): Secondary | ICD-10-CM | POA: Insufficient documentation

## 2010-08-22 DIAGNOSIS — M25676 Stiffness of unspecified foot, not elsewhere classified: Secondary | ICD-10-CM | POA: Insufficient documentation

## 2010-08-22 DIAGNOSIS — M25579 Pain in unspecified ankle and joints of unspecified foot: Secondary | ICD-10-CM | POA: Insufficient documentation

## 2010-08-22 DIAGNOSIS — R269 Unspecified abnormalities of gait and mobility: Secondary | ICD-10-CM | POA: Insufficient documentation

## 2010-08-22 DIAGNOSIS — IMO0001 Reserved for inherently not codable concepts without codable children: Secondary | ICD-10-CM | POA: Insufficient documentation

## 2010-08-31 LAB — RH IMMUNE GLOB WKUP(>/=20WKS)(NOT WOMEN'S HOSP): Fetal Screen: NEGATIVE

## 2010-08-31 LAB — CBC
HCT: 30.9 % — ABNORMAL LOW (ref 36.0–46.0)
Hemoglobin: 10.7 g/dL — ABNORMAL LOW (ref 12.0–15.0)
MCHC: 34.3 g/dL (ref 30.0–36.0)
RBC: 3.72 MIL/uL — ABNORMAL LOW (ref 3.87–5.11)
WBC: 10.9 10*3/uL — ABNORMAL HIGH (ref 4.0–10.5)
WBC: 15 10*3/uL — ABNORMAL HIGH (ref 4.0–10.5)

## 2010-08-31 LAB — RPR: RPR Ser Ql: NONREACTIVE

## 2010-09-01 LAB — COMPREHENSIVE METABOLIC PANEL
BUN: 7 mg/dL (ref 6–23)
CO2: 26 mEq/L (ref 19–32)
Calcium: 9 mg/dL (ref 8.4–10.5)
Chloride: 104 mEq/L (ref 96–112)
Creatinine, Ser: 0.6 mg/dL (ref 0.4–1.2)
GFR calc Af Amer: >60 mL/min (ref >60)
GFR calc non Af Amer: >60 mL/min (ref >60)
Total Bilirubin: 0.3 mg/dL (ref 0.3–1.2)

## 2010-09-01 LAB — URINE CULTURE: Colony Count: NO GROWTH

## 2010-09-01 LAB — URINALYSIS, ROUTINE W REFLEX MICROSCOPIC
Bilirubin Urine: NEGATIVE
Glucose, UA: NEGATIVE mg/dL
Specific Gravity, Urine: 1.025 (ref 1.005–1.030)
Urobilinogen, UA: 0.2 mg/dL (ref 0.0–1.0)
pH: 6 (ref 5.0–8.0)

## 2010-09-01 LAB — CBC
HCT: 32.2 % — ABNORMAL LOW (ref 36.0–46.0)
MCHC: 34.8 g/dL (ref 30.0–36.0)
MCV: 93 fL (ref 78.0–100.0)
Platelets: 214 10*3/uL (ref 150–400)
RBC: 3.46 MIL/uL — ABNORMAL LOW (ref 3.87–5.11)
WBC: 10.2 10*3/uL (ref 4.0–10.5)

## 2010-09-01 LAB — URINE MICROSCOPIC-ADD ON

## 2010-09-02 LAB — RH IMMUNE GLOBULIN WORKUP (NOT WOMEN'S HOSP): Antibody Screen: NEGATIVE

## 2010-09-03 ENCOUNTER — Ambulatory Visit: Payer: Medicaid Other

## 2010-09-03 LAB — URINALYSIS, ROUTINE W REFLEX MICROSCOPIC
Bilirubin Urine: NEGATIVE
Glucose, UA: NEGATIVE mg/dL
Ketones, ur: NEGATIVE mg/dL
Protein, ur: NEGATIVE mg/dL
pH: 6 (ref 5.0–8.0)

## 2010-09-03 LAB — URINE MICROSCOPIC-ADD ON

## 2010-09-05 ENCOUNTER — Ambulatory Visit (INDEPENDENT_AMBULATORY_CARE_PROVIDER_SITE_OTHER): Payer: Medicaid Other | Admitting: Family Medicine

## 2010-09-05 ENCOUNTER — Encounter: Payer: Self-pay | Admitting: Family Medicine

## 2010-09-05 ENCOUNTER — Other Ambulatory Visit: Payer: Self-pay | Admitting: Family Medicine

## 2010-09-05 VITALS — BP 110/75 | HR 80 | Temp 98.0°F

## 2010-09-05 DIAGNOSIS — J069 Acute upper respiratory infection, unspecified: Secondary | ICD-10-CM

## 2010-09-05 DIAGNOSIS — J029 Acute pharyngitis, unspecified: Secondary | ICD-10-CM

## 2010-09-05 DIAGNOSIS — E669 Obesity, unspecified: Secondary | ICD-10-CM

## 2010-09-05 LAB — URINE MICROSCOPIC-ADD ON

## 2010-09-05 LAB — URINE CULTURE

## 2010-09-05 LAB — URINALYSIS, ROUTINE W REFLEX MICROSCOPIC
Bilirubin Urine: NEGATIVE
Glucose, UA: NEGATIVE mg/dL
Ketones, ur: NEGATIVE mg/dL
Leukocytes, UA: NEGATIVE
pH: 6 (ref 5.0–8.0)

## 2010-09-05 LAB — POCT RAPID STREP A (OFFICE): Rapid Strep A Screen: NEGATIVE

## 2010-09-05 NOTE — Progress Notes (Signed)
UPPER RESPIRATORY INFECTION  Onset: 3 days  Course: Not getting much better Better with: Mucinex and ibuprofen  Sick contacts: 1.5 yo son also is sick  Nasal discharge (color,laterality): Some clear        Nasal congestion  Sinusitis Risk Factors Fever: No Headache/face pain: no  Double sickening: no  Tooth pain: no   Allergy Risk Factors: Sneezing: no  Itchy scratchy throat: no  Seasonal sx: no   Flu Risk Factors Headache: no  Muscle aches: yes  Severe fatigue: no    Red Flags  Stiff neck: no  Dyspnea: no  Rash: no  Swallowing difficulty: no  Feels "sick" Thinks she has a cold.  ROS: As above. No fevers or chills.   Exam:  Vs noted.  Gen: Well NAD HEENT: EOMI,, MMM TM normal BL. Some pharyngeal erythremia without exudate.  Lungs: CTABL Nl WOB Heart: RRR no MRG

## 2010-09-05 NOTE — Patient Instructions (Signed)
Thank you for coming in today. Use OTC nasal spray (like Affrin) for nose congestion. Nasal saline helps to.  Use a humidifier for some chest congestion. Tylenol or ibuprofen works [employer]. Schedule it so take ibuprofen every 8 hours. Come see me if you dont get better or you get worse or you get a high fever.

## 2010-09-05 NOTE — Assessment & Plan Note (Signed)
Think symptoms are due to Virus.  Rapid Strep neg. Pt already doing the supportive care. Also advised OTC nasal spray for 3 days as needed.  Handout given.  Plan to f/u prn if not getting better or getting worse.

## 2010-09-09 ENCOUNTER — Ambulatory Visit: Payer: Medicaid Other | Admitting: Physical Therapy

## 2010-09-09 LAB — URINALYSIS, ROUTINE W REFLEX MICROSCOPIC
Bilirubin Urine: NEGATIVE
Protein, ur: NEGATIVE mg/dL
Urobilinogen, UA: 0.2 mg/dL (ref 0.0–1.0)

## 2010-09-09 LAB — WET PREP, GENITAL
Clue Cells Wet Prep HPF POC: NONE SEEN
Trich, Wet Prep: NONE SEEN

## 2010-09-09 LAB — GC/CHLAMYDIA PROBE AMP, GENITAL
Chlamydia, DNA Probe: NEGATIVE
GC Probe Amp, Genital: NEGATIVE

## 2010-09-09 LAB — URINE MICROSCOPIC-ADD ON

## 2010-09-10 LAB — URINALYSIS, ROUTINE W REFLEX MICROSCOPIC
Bilirubin Urine: NEGATIVE
Protein, ur: NEGATIVE mg/dL
Urobilinogen, UA: 0.2 mg/dL (ref 0.0–1.0)

## 2010-09-10 LAB — WET PREP, GENITAL
Clue Cells Wet Prep HPF POC: NONE SEEN
Yeast Wet Prep HPF POC: NONE SEEN

## 2010-09-10 LAB — URINE MICROSCOPIC-ADD ON

## 2010-09-10 LAB — GC/CHLAMYDIA PROBE AMP, GENITAL
Chlamydia, DNA Probe: NEGATIVE
GC Probe Amp, Genital: NEGATIVE

## 2010-09-12 ENCOUNTER — Ambulatory Visit: Payer: Medicaid Other | Attending: Family Medicine | Admitting: Physical Therapy

## 2010-09-12 DIAGNOSIS — M25673 Stiffness of unspecified ankle, not elsewhere classified: Secondary | ICD-10-CM | POA: Insufficient documentation

## 2010-09-12 DIAGNOSIS — R269 Unspecified abnormalities of gait and mobility: Secondary | ICD-10-CM | POA: Insufficient documentation

## 2010-09-12 DIAGNOSIS — M25579 Pain in unspecified ankle and joints of unspecified foot: Secondary | ICD-10-CM | POA: Insufficient documentation

## 2010-09-12 DIAGNOSIS — M25676 Stiffness of unspecified foot, not elsewhere classified: Secondary | ICD-10-CM | POA: Insufficient documentation

## 2010-09-12 DIAGNOSIS — M6281 Muscle weakness (generalized): Secondary | ICD-10-CM | POA: Insufficient documentation

## 2010-09-12 DIAGNOSIS — IMO0001 Reserved for inherently not codable concepts without codable children: Secondary | ICD-10-CM | POA: Insufficient documentation

## 2010-09-17 ENCOUNTER — Ambulatory Visit: Payer: Medicaid Other

## 2010-09-20 ENCOUNTER — Ambulatory Visit: Payer: Medicaid Other | Admitting: Physical Therapy

## 2010-09-23 ENCOUNTER — Ambulatory Visit: Payer: Medicaid Other | Admitting: Family Medicine

## 2010-11-08 ENCOUNTER — Ambulatory Visit (INDEPENDENT_AMBULATORY_CARE_PROVIDER_SITE_OTHER): Payer: Self-pay | Admitting: Sports Medicine

## 2010-11-08 ENCOUNTER — Encounter: Payer: Self-pay | Admitting: Sports Medicine

## 2010-11-08 DIAGNOSIS — B353 Tinea pedis: Secondary | ICD-10-CM

## 2010-11-08 DIAGNOSIS — L03032 Cellulitis of left toe: Secondary | ICD-10-CM

## 2010-11-08 DIAGNOSIS — L03039 Cellulitis of unspecified toe: Secondary | ICD-10-CM

## 2010-11-08 MED ORDER — TERBINAFINE HCL 250 MG PO TABS: 250.0000 mg | ORAL_TABLET | Freq: Every day | ORAL | Status: DC

## 2010-11-08 MED ORDER — HYDROCODONE-ACETAMINOPHEN 5-500 MG PO TABS: 1.0000 | ORAL_TABLET | Freq: Four times a day (QID) | ORAL | Status: AC | PRN

## 2010-11-08 MED ORDER — DOXYCYCLINE HYCLATE 100 MG PO TABS: 100.0000 mg | ORAL_TABLET | Freq: Two times a day (BID) | ORAL | Status: AC

## 2010-11-08 NOTE — Assessment & Plan Note (Signed)
Will tx terbinafine 250mg  PO daily for 4 weeks.

## 2010-11-08 NOTE — Progress Notes (Signed)
  Subjective:    Patient ID: Katherine Fritz, female    DOB: Jul 01, 1980, 30 y.o.   MRN: [id number]  HPI R Foot itching:  R foot, spots on bottom, itching for weeks now.   L Great toe pain:  S/p toenail excision.  Now with exquisite pain and some purulent drainage on proximal lateral great toenail with swelling and redness.  No fevers/chills.   Review of Systems    See HPI Objective:   Physical Exam  Constitutional: She appears well-developed and well-nourished. No distress.  Skin: Skin is warm and dry.       R foot:  Some scaling of skin and a few hard, reddish brown nodules on plantar aspect of R foot.  Entire foot pruritic.  L foot:  Paronychia present on lateral proximal great toenail.  Purulence seen.  Exquisitely TTP.        Procedure:  Drainage of paronychia. Risks, benefits, alternatives explained to patient. Consent obtained. Time out conducted. Noted no overlying induration or erythema at site of injection. Toe cleaned with alcohol, then a total of 10 cc lidocaine 2% infiltrated at adjacent webspaces at the location of the bifurcation of the common digital nerve to proper digital nerves as well as over dorsum of Left great toe.   Adequate anesthesia ensured. 18g needle used to drain paronychia. Toe dressed. Advised to return if increased redness, swelling, drainage, fevers, or chills.  Assessment & Plan:

## 2010-11-08 NOTE — Assessment & Plan Note (Signed)
Drained. Doxy for 7d. RTC prn.

## 2010-11-08 NOTE — Patient Instructions (Addendum)
Great to see you today. Terbinafine for foot fungus. Drained your paronychia. Doxycycline for 7 days, vicodin as needed for pain. Come back as needed.  Katherine Fritz. [name], M.D. [facility] [address] [place], [place] [address] [phone]   Athlete's Foot Athlete's foot (Tinea Pedis) is an infection of the skin on the feet. It often occurs on the skin between or underneath the toes, but also on the soles of the feet. Athlete's foot is more likely to occur in hot, humid weather. It may occur in anyone, but not washing feet or changing socks frequently enough contributes to this. It may be spread to other people by sharing towels or shower stalls. CAUSE Athlete's foot is caused by a fungus (not a worm, despite the nickname ringworm). This is a very small (microscopic) germ that thrives in warm, moist environments such as sweaty feet and other parts of the body. The fungus usually spreads from one person to another. This happens from sharing shower stalls, sharing towels, wet floors, etc. People with weakened immune systems, including people with diabetes, may be more prone to athlete's foot. SYMPTOMS  Itchy areas between the toes or on the soles of your feet.   White, flakey, or scaly areas between the toes or on the soles of the feet.   Tiny, intensely itchy blisters between the toes or on the soles of the feet.   The skin can develop tiny cuts. These cuts can develop further into an infection from bacteria (another kind of germ).   The adjacent toenails may be thickened or discolored.  Athlete's foot does not usually cause fever or generalized illness. DIAGNOSIS Your caregiver usually will recognize athlete's foot based on appearance. If the diagnosis is uncertain, then the rash area can be scraped. Then, the small particles of the skin can be examined more closely. This could include your caregiver examining the specimen under a microscope. It might involve  sending the specimen to a lab to see if the fungus can be grown. This could take several weeks to get a result. TREATMENT The first step in treatment is prevention:  Do not share towels.   Wear sandals or other foot protection in wet areas such as locker rooms, shared showers and public swimming pools.   Keep your feet dry. Wear shoes that allow air to circulate and use cotton or wool socks.  Many products are available to treat athlete's foot. Most are medications that kill fungus. They are available as powders or creams. Some require a prescription, some do not. Your caregiver or pharmacist can make a suggestion. Do not use steroid creams on athlete's foot. HOME CARE INSTRUCTIONS  Use medications as prescribed.   Keep your feet clean and cool with daily washes. Dry them well, especially between your toes.   Change your socks every day. Use cotton or wool socks. It is helpful to go barefoot. If you go barefoot, even in the household, be careful not to cause other injuries. (Stepping on toothpicks and needles can require Emergency Department visits!) In hot climates, it may be necessary to change socks 2 to 3 times per day. Wear sandals or canvas tennis shoes during treatment.   If you have blisters, soak your feet in Burrow's or an Epsom salt solution for 20 to 30 minutes, 2 times a day to dry out the blisters. Be sure to thoroughly pat dry, or air dry, your feet.   To keep infection from returning, keep wearing cotton or  wool socks and dry your feet well after washing. Wipe between your toes at bedtime.   Fungal infections are hard to get rid of and respond slowly. Continue the treatment even if your athlete's foot does not seem to be getting better. Treatment may be needed for several weeks.  SEEK MEDICAL CARE IF:  You develop a temperature with no other apparent cause.   You develop swelling and soreness & redness (inflammation) in your foot.   The infection is spreading. Your foot  becomes swollen, hot and red (inflamed).   Treatment is not helping.   Athlete's foot is not better in 7 days or completely cured in 30 days.   You have any problems that may be related to the medicine you are taking.  MAKE SURE YOU:    Understand these instructions.   Will watch your condition.   Will get help right away if you are not doing well or get worse.  Document Released: 05/09/2000 Document Re-Released: 04/24/2008 [facility] Patient Information 2011 [place], [place].  Paronychia (Felon, [place]) Paronychia is an inflammatory reaction involving the folds of the skin surrounding the fingernail. This is commonly caused by an infection in the skin around a nail. The most common cause of paronychia is frequent wetting of the hands (as seen with bartenders, food servers, nurses or others who wet their hands). This makes the skin around the fingernail susceptible to infection by bacteria (germs) or fungus. Other predisposing factors are:  Aggressive manicuring.   Nail biting.   Thumbsucking.  The most common cause is a staphylococcal (a type of germ) infection, or a fungal (Candida) infection. When caused by a germ, it usually comes on suddenly with redness, swelling, pus and is often painful. It may get under the nail and form an abscess (collection of pus), or form an abscess around the nail. If the nail itself is infected with a fungus, the treatment is usually prolonged and may require oral medicine for up to one year. Your caregiver will determine the length of time treatment is required. The paronychia caused by bacteria (germs) may largely be avoided by not pulling on hangnails or picking at cuticles. When the infection occurs at the tips of the finger it is called felon. When the cause of paronychia is from the herpes simplex virus (HSV) it is called herpetic whitlow. TREATMENT  When an abscess is present treatment is often incision and drainage. This means that the abscess must  be cut open so the pus can get out. When this is done, the following home care instructions should be followed.  HOME CARE INSTRUCTIONS  It is important to keep the affected fingers very dry. Rubber or plastic gloves over cotton gloves should be used whenever the hand must be placed in water.   Keep wound clean, dry and dressed as suggested by your caregiver between warm soaks or warm compresses.   Soak in warm water for fifteen to twenty minutes three to four times per day for bacterial infections. Fungal infections are very difficult to treat, so often require treatment for long periods of time.   For bacterial (germ) infections take antibiotics (medicine which kill germs) as directed and finish the prescription, even if the problem appears to be solved before the medicine is gone.   Only take over-the-counter or prescription medicines for pain, discomfort, or fever as directed by your caregiver.  SEEK IMMEDIATE MEDICAL CARE IF :  There is redness, swelling, or increasing pain in the wound.  Pus is coming from wound.   An unexplained oral temperature above 100.4 develops.   You notice a foul smell coming from the wound or dressing.  Document Released: 11/05/2000 Document Re-Released: 08/08/2008 [facility] Patient Information 2011 [place], [place].

## 2010-12-04 ENCOUNTER — Ambulatory Visit: Payer: Medicaid Other

## 2010-12-07 ENCOUNTER — Inpatient Hospital Stay (INDEPENDENT_AMBULATORY_CARE_PROVIDER_SITE_OTHER)
Admission: RE | Admit: 2010-12-07 | Discharge: 2010-12-07 | Disposition: A | Discharge status: Home / Self Care | Payer: Medicaid Other | Source: Ambulatory Visit | Attending: Family Medicine | Admitting: Family Medicine

## 2010-12-07 DIAGNOSIS — T22019A Burn of unspecified degree of unspecified forearm, initial encounter: Secondary | ICD-10-CM

## 2010-12-07 DIAGNOSIS — X088XXA Exposure to other specified smoke, fire and flames, initial encounter: Secondary | ICD-10-CM

## 2010-12-10 ENCOUNTER — Inpatient Hospital Stay (INDEPENDENT_AMBULATORY_CARE_PROVIDER_SITE_OTHER)
Admission: RE | Admit: 2010-12-10 | Discharge: 2010-12-10 | Disposition: A | Discharge status: Home / Self Care | Payer: Self-pay | Source: Ambulatory Visit | Attending: Family Medicine | Admitting: Family Medicine

## 2010-12-10 DIAGNOSIS — T22019A Burn of unspecified degree of unspecified forearm, initial encounter: Secondary | ICD-10-CM

## 2011-02-21 LAB — BASIC METABOLIC PANEL
BUN: 11
CO2: 26
Chloride: 105
Creatinine, Ser: 0.7
Glucose, Bld: 89

## 2011-02-21 LAB — DIFFERENTIAL
Basophils Relative: 0
Lymphs Abs: 1.6
Monocytes Absolute: 0.5
Monocytes Relative: 6
Neutro Abs: 5.9
Neutrophils Relative %: 74

## 2011-02-21 LAB — WET PREP, GENITAL
Clue Cells Wet Prep HPF POC: NONE SEEN
Yeast Wet Prep HPF POC: NONE SEEN

## 2011-02-21 LAB — CBC
MCHC: 35
MCV: 88
Platelets: 257
RDW: 12.7

## 2011-02-21 LAB — GC/CHLAMYDIA PROBE AMP, GENITAL: Chlamydia, DNA Probe: NEGATIVE

## 2011-02-21 LAB — URINALYSIS, ROUTINE W REFLEX MICROSCOPIC
Nitrite: NEGATIVE
Specific Gravity, Urine: 1.025
Urobilinogen, UA: 1

## 2011-02-21 LAB — URINE MICROSCOPIC-ADD ON

## 2011-02-21 LAB — RPR: RPR Ser Ql: NONREACTIVE

## 2011-02-28 LAB — CBC
HCT: 32.7 % — ABNORMAL LOW (ref 36.0–46.0)
MCV: 89.4 fL (ref 78.0–100.0)
Platelets: 226 10*3/uL (ref 150–400)
RBC: 3.65 MIL/uL — ABNORMAL LOW (ref 3.87–5.11)
WBC: 8.9 10*3/uL (ref 4.0–10.5)

## 2011-02-28 LAB — POCT PREGNANCY, URINE: Preg Test, Ur: POSITIVE

## 2011-02-28 LAB — WET PREP, GENITAL
Clue Cells Wet Prep HPF POC: NONE SEEN
Trich, Wet Prep: NONE SEEN

## 2011-02-28 LAB — URINALYSIS, ROUTINE W REFLEX MICROSCOPIC
Bilirubin Urine: NEGATIVE
Ketones, ur: NEGATIVE mg/dL
Nitrite: NEGATIVE
Protein, ur: NEGATIVE mg/dL
Specific Gravity, Urine: >1.03 — ABNORMAL HIGH (ref 1.005–1.030)
Urobilinogen, UA: 0.2 mg/dL (ref 0.0–1.0)

## 2011-02-28 LAB — RH IMMUNE GLOBULIN WORKUP (NOT WOMEN'S HOSP): Antibody Screen: NEGATIVE

## 2011-02-28 LAB — URINE MICROSCOPIC-ADD ON

## 2011-02-28 LAB — GC/CHLAMYDIA PROBE AMP, GENITAL
Chlamydia, DNA Probe: NEGATIVE
GC Probe Amp, Genital: NEGATIVE

## 2011-02-28 LAB — ABO/RH: Weak D: NEGATIVE

## 2011-03-04 LAB — URINALYSIS, ROUTINE W REFLEX MICROSCOPIC
Bilirubin Urine: NEGATIVE
Nitrite: NEGATIVE
Protein, ur: NEGATIVE
Urobilinogen, UA: 0.2

## 2011-03-04 LAB — PREGNANCY, URINE: Preg Test, Ur: NEGATIVE

## 2011-03-10 LAB — URINE MICROSCOPIC-ADD ON

## 2011-03-10 LAB — PREGNANCY, URINE: Preg Test, Ur: NEGATIVE

## 2011-03-10 LAB — URINALYSIS, ROUTINE W REFLEX MICROSCOPIC
Glucose, UA: NEGATIVE
Nitrite: NEGATIVE
Specific Gravity, Urine: 1.029
pH: 5.5

## 2011-03-12 LAB — WOUND CULTURE

## 2011-03-14 ENCOUNTER — Emergency Department (HOSPITAL_BASED_OUTPATIENT_CLINIC_OR_DEPARTMENT_OTHER)
Admission: EM | Admit: 2011-03-14 | Discharge: 2011-03-15 | Disposition: A | Discharge status: Home / Self Care | Payer: Self-pay | Attending: Emergency Medicine | Admitting: Emergency Medicine

## 2011-03-14 ENCOUNTER — Encounter (HOSPITAL_BASED_OUTPATIENT_CLINIC_OR_DEPARTMENT_OTHER): Payer: Self-pay

## 2011-03-14 ENCOUNTER — Emergency Department (INDEPENDENT_AMBULATORY_CARE_PROVIDER_SITE_OTHER): Payer: Self-pay

## 2011-03-14 ENCOUNTER — Ambulatory Visit: Payer: Medicaid Other | Admitting: Family Medicine

## 2011-03-14 ENCOUNTER — Other Ambulatory Visit: Payer: Self-pay

## 2011-03-14 DIAGNOSIS — M546 Pain in thoracic spine: Secondary | ICD-10-CM | POA: Insufficient documentation

## 2011-03-14 NOTE — ED Notes (Signed)
C/o pain that started to right leg, then generalized pain x 2 weeks-now pain to upper back between shoulder blades

## 2011-03-14 NOTE — ED Notes (Signed)
Pt reports pinpointed pain to right lateral thigh that began a couple weeks ago. States the pain is intermittent. Then several days following that, the pain began to her right hip, and extends downward to knee at times. Pt came in tonight, for a new onset of pain in between her shoulder blades. Also intermittent. Denies CP or SOB. Denies any recent injury or new exercise routine. Denies recent cold/cough.

## 2011-03-15 ENCOUNTER — Emergency Department (INDEPENDENT_AMBULATORY_CARE_PROVIDER_SITE_OTHER): Payer: Self-pay

## 2011-03-15 DIAGNOSIS — M549 Dorsalgia, unspecified: Secondary | ICD-10-CM

## 2011-03-15 DIAGNOSIS — M545 Low back pain, unspecified: Secondary | ICD-10-CM

## 2011-03-15 DIAGNOSIS — R079 Chest pain, unspecified: Secondary | ICD-10-CM

## 2011-03-15 LAB — URINALYSIS, ROUTINE W REFLEX MICROSCOPIC
Hgb urine dipstick: NEGATIVE
Leukocytes, UA: NEGATIVE
Specific Gravity, Urine: 1.023 (ref 1.005–1.030)
Urobilinogen, UA: 1 mg/dL (ref 0.0–1.0)

## 2011-03-15 LAB — CBC
Hemoglobin: 12.4 g/dL (ref 12.0–15.0)
RBC: 4.29 MIL/uL (ref 3.87–5.11)

## 2011-03-15 LAB — BASIC METABOLIC PANEL
BUN: 13 mg/dL (ref 6–23)
Chloride: 103 mEq/L (ref 96–112)
GFR calc Af Amer: >90 mL/min (ref >90)
Glucose, Bld: 102 mg/dL — ABNORMAL HIGH (ref 70–99)
Potassium: 3.7 mEq/L (ref 3.5–5.1)

## 2011-03-15 LAB — PREGNANCY, URINE: Preg Test, Ur: NEGATIVE

## 2011-03-15 LAB — DIFFERENTIAL
Lymphs Abs: 2.3 10*3/uL (ref 0.7–4.0)
Monocytes Relative: 8 % (ref 3–12)
Neutro Abs: 6.4 10*3/uL (ref 1.7–7.7)
Neutrophils Relative %: 68 % (ref 43–77)

## 2011-03-15 LAB — CARDIAC PANEL(CRET KIN+CKTOT+MB+TROPI)
Relative Index: INVALID (ref 0.0–2.5)
Total CK: 76 U/L (ref 7–177)

## 2011-03-15 NOTE — ED Provider Notes (Signed)
History     CSN: [id number] Arrival date & time: 03/14/2011  9:58 PM   First MD Initiated Contact with Patient 03/14/11 2315      Chief Complaint  Patient presents with  . Back Pain    (Consider location/radiation/quality/duration/timing/severity/associated sxs/prior treatment) HPI Comments: Symptoms occur intermittantly.  Nothing brings them on.  Intermittant chest pressure but none now and only lasts seconds.  States probably more winded when walking up 3 flights of stairs.  Patient is a 30 y.o. female presenting with back pain. The history is provided by the patient.  Back Pain  This is a new problem. Episode onset: 2 weeks ago. The problem occurs every several days. The problem has been gradually worsening. The pain is associated with no known injury. The pain is present in the thoracic spine. The quality of the pain is described as aching. Radiates to: upper arms intermittently. The pain is at a severity of 4/10. The pain is moderate. Exacerbated by: nothing. The pain is the same all the time. Stiffness is present: none. Associated symptoms include chest pain and leg pain. Pertinent negatives include no fever, no numbness, no weight loss, no abdominal pain, no dysuria, no pelvic pain, no paresthesias, no paresis, no tingling and no weakness. She has tried nothing for the symptoms.    History reviewed. No pertinent past medical history.  History reviewed. No pertinent past surgical history.  No family history on file.  History  Substance Use Topics  . Smoking status: Former Smoker    Quit date: 09/04/2008  . Smokeless tobacco: Never Used  . Alcohol Use: No    OB History    Grav Para Term Preterm Abortions TAB SAB Ect Mult Living                  Review of Systems  Constitutional: Negative for fever and weight loss.  Cardiovascular: Positive for chest pain.  Gastrointestinal: Negative for abdominal pain.  Genitourinary: Negative for dysuria and pelvic pain.    Musculoskeletal: Positive for back pain.  Neurological: Negative for tingling, weakness, numbness and paresthesias.  All other systems reviewed and are negative.    Allergies  Penicillins  Home Medications   Current Outpatient Rx  Name Route Sig Dispense Refill  . ASPIRIN 325 MG PO TBEC Oral Take 325 mg by mouth once.      Marland Kitchen OVER THE COUNTER MEDICATION Oral Take 3 tablets by mouth 3 (three) times daily. Complete nutrition CTS 360 Weightloss pills      . PARAGARD INTRAUTERINE COPPER IU IUD Intrauterine by Intrauterine route once.      . TERBINAFINE HCL 250 MG PO TABS Oral Take 1 tablet (250 mg total) by mouth daily. 28 tablet 0    BP 115/71  Pulse 88  Temp(Src) 99.1 F (37.3 C) (Oral)  Resp 18  Ht 5\' 4"  (1.626 m)  Wt 186 lb (84.369 kg)  BMI 31.93 kg/m2  SpO2 100%  LMP 02/25/2011  Physical Exam  Nursing note and vitals reviewed. Constitutional: She is oriented to person, place, and time. She appears well-developed and well-nourished. No distress.  HENT:  Head: Normocephalic and atraumatic.  Eyes: EOM are normal. Pupils are equal, round, and reactive to light.  Cardiovascular: Normal rate, regular rhythm, normal heart sounds and intact distal pulses.  Exam reveals no friction rub.   No murmur heard. Pulmonary/Chest: Effort normal and breath sounds normal. She has no wheezes. She has no rales.  Abdominal: Soft. Bowel sounds are normal. She  exhibits no distension. There is no tenderness. There is no rebound and no guarding.  Musculoskeletal: Normal range of motion. She exhibits no tenderness.       No edema, no tenderness on palpation of spine.    Neurological: She is alert and oriented to person, place, and time. She has normal strength. No cranial nerve deficit or sensory deficit. Gait normal.  Skin: Skin is warm and dry. No rash noted.  Psychiatric: She has a normal mood and affect. Her behavior is normal.    ED Course  Procedures (including critical care  time)  Labs Reviewed  BASIC METABOLIC PANEL - Abnormal; Notable for the following:    Glucose, Bld 102 (*)    All other components within normal limits  CBC  DIFFERENTIAL  D-DIMER, QUANTITATIVE  URINALYSIS, ROUTINE W REFLEX MICROSCOPIC  PREGNANCY, URINE  CARDIAC PANEL(CRET KIN+CKTOT+MB+TROPI)   Dg Chest 2 View  03/15/2011  *RADIOLOGY REPORT*  Clinical Data: Chest and upper back pain for 1 week.  CHEST - 2 VIEW  Comparison: Thoracic spine radiographs from 03/15/2011  Findings: Cardiac and mediastinal contours appear normal.  The lungs appear clear.  No pleural effusion is identified.  IMPRESSION:  No significant abnormality identified.  Original Report Authenticated By: Dellia Cloud, M.D.   Dg Thoracic Spine 4v  03/15/2011  *RADIOLOGY REPORT*  Clinical Data: Chest and upper back pain for 1 week.  THORACIC SPINE - 4+ VIEW  Comparison: None.  Findings: Thoracic vertebral alignment appears normal.  No thoracic spine fracture or acute subluxation is identified.  No acute thoracic spine findings noted.  IMPRESSION:  No significant abnormality identified.  Original Report Authenticated By: Dellia Cloud, M.D.     1. Back pain, thoracic      Date: 03/15/2011  Rate: 71  Rhythm: normal sinus rhythm  QRS Axis: normal  Intervals: normal  ST/T Wave abnormalities: normal  Conduction Disutrbances:none  Narrative Interpretation:   Old EKG Reviewed: none available    MDM   Pt with unusual symptoms of intermittent right lateral thigh pain without radiation for the last 3 weeks which last seconds to minutes and does not improve or start at any particular time with new upper thoracic pain with pain in the arms intermittantly and mild chest tightness at times and increased dyspnea with heavy exertion.  Pt denies any infectious sx, no OCP or hormone other than IUD, no recent travel and no risk factors for cardiac issues or PE.  Pt has good normal pulses and no pain moving down to the  abd or concern for dissection.  Will r/o PE with d-dimer as she is low risk and normal EKG.  CXR, CBC, d-dimer, UA/UPT, CK, BMP all wnl.  Pt not currently having any pain.  No neuro symptoms with pain.  No dysuria.  No new medications but pt does lift her 30lb child frequently and maybe the cause of sx.   She has no joint issues and no signs of swelling.  t-spine wnl. Will d/c and have f/u with PCP for further care.      Gwyneth Sprout, MD 03/15/11 [phone]

## 2011-03-15 NOTE — ED Notes (Signed)
Dr Anitra Lauth has spoken to pt regarding test results and f/u care.

## 2011-03-15 NOTE — ED Notes (Signed)
Patient transported to XR. 

## 2011-05-21 ENCOUNTER — Encounter: Payer: Self-pay | Admitting: Family Medicine

## 2011-05-21 ENCOUNTER — Ambulatory Visit (INDEPENDENT_AMBULATORY_CARE_PROVIDER_SITE_OTHER): Payer: Medicaid Other | Admitting: Family Medicine

## 2011-05-21 VITALS — BP 116/84 | HR 74 | Temp 98.7°F | Ht 64.0 in | Wt 189.3 lb

## 2011-05-21 DIAGNOSIS — Z23 Encounter for immunization: Secondary | ICD-10-CM

## 2011-05-21 DIAGNOSIS — Z309 Encounter for contraceptive management, unspecified: Secondary | ICD-10-CM

## 2011-05-21 MED ORDER — NORGESTIMATE-ETH ESTRADIOL 0.25-35 MG-MCG PO TABS: 1.0000 | ORAL_TABLET | Freq: Every day | ORAL | Status: DC

## 2011-05-21 MED ORDER — PRENATAL VITAMINS 0.8 MG PO TABS: 1.0000 | ORAL_TABLET | Freq: Every day | ORAL | Status: AC

## 2011-05-21 NOTE — Progress Notes (Signed)
  Subjective:    Patient ID: Katherine Fritz, female    DOB: 1980/07/14, 30 y.o.   MRN: [id number]  HPI  Would like IUD removal.  Had copper IUD placed June 2011.  Has been happy with it, but uncomfortable with the thought of IUD preventing implantation. Would prefer OCP to prevent ovulation instead.  Also plans on getting pregnant in the next year.  Has taken OCP's in the past- one resulting in unplanned pregnancy due to not regularly taking, feels she is able to be more consistent. Review of Systems     Objective:   Physical Exam GEN: NAD  Procedure IUD removal  String identified, IUD grasps with  Forceps, removed.  Entire IUD visualized.  Patient tolerated procedure well.       Assessment & Plan:

## 2011-05-21 NOTE — Assessment & Plan Note (Signed)
Removed paragard and rxed OCP per patient preference.  Advised on backup protection until completed first month of OCP.  Counseled on starting prenatal vitamins.

## 2011-05-21 NOTE — Patient Instructions (Signed)
Use backup protection until you are done with your first pack of birth control pills  Start taking prenatal vitamins daily  Annual physical in march

## 2011-06-02 ENCOUNTER — Telehealth: Payer: Self-pay | Admitting: Family Medicine

## 2011-06-02 NOTE — Telephone Encounter (Signed)
LMP---05/14/11.  C/o breasts being extremely sensitive since last week.  Started new birth control 1 week before symptoms started.  States whole breast is "sensitive" and "feels like pins in nipples."    States it feels very similar to when breasts are full with milk after having a baby.  Denies redness or drainage.  Has been using a support bra at all times since symptoms started.  Has not used any cool compresses.  Informed patient that change in hormonal levels can cause breast pain.  Can try cool compresses and Tylenol or ibuprofen to help with pain.  Will route to Dr. Earnest Bailey.  Gaylene Brooks, RN

## 2011-06-02 NOTE — Telephone Encounter (Signed)
Agreed with suggestions and can be caused by hormone.  If becomes severe or notices red flags as you have mentioned, please have her come in for evaluation.  Continue with contraception.

## 2011-06-02 NOTE — Telephone Encounter (Signed)
Called patient and informed of note from Dr. Earnest Bailey.  Gaylene Brooks, RN

## 2011-06-02 NOTE — Telephone Encounter (Signed)
Patient is calling about some symptoms in her breast.  She would like to speak to someone about if this could come from her Birth Control.

## 2012-03-24 ENCOUNTER — Ambulatory Visit (INDEPENDENT_AMBULATORY_CARE_PROVIDER_SITE_OTHER): Payer: Medicaid Other | Admitting: Family Medicine

## 2012-03-24 ENCOUNTER — Encounter: Payer: Self-pay | Admitting: Family Medicine

## 2012-03-24 VITALS — Ht 64.0 in | Wt 198.0 lb

## 2012-03-24 DIAGNOSIS — N912 Amenorrhea, unspecified: Secondary | ICD-10-CM

## 2012-03-24 DIAGNOSIS — Z3200 Encounter for pregnancy test, result unknown: Secondary | ICD-10-CM

## 2012-03-24 NOTE — Assessment & Plan Note (Signed)
Positive pregnancy test in clinic.  Unsure if she will obtain prenatal care at our practice.  Advised starting prenatal care soon.  She is taking PNV.  Reviewed bleeding precautions, red flags.

## 2012-03-24 NOTE — Patient Instructions (Addendum)
Thank you for coming in today, it was good to see you Your pregnancy test is positive today. Please schedule an appointment to begin prenatal care.  We offer prenatal care at our office and would enjoy having you here. Please call with any questions.

## 2012-03-24 NOTE — Progress Notes (Signed)
  Subjective:    Patient ID: Katherine Fritz, female    DOB: Dec 22, 1980, 31 y.o.   MRN: [id number]  HPI 1.  Confirmation of pregnancy:  Here for confirmation of pregnancy.  States that she had a positive pregnancy test at home on 10/26.  Date of LMP was 02/22/2012, described as typical period but maybe a couple days shorter.  She does endorse breast tenderness and nausea.  She is undecided whether she will get prenatal care at our clinic. She is taking PNV   Review of Systems Denies vaginal bleeding, discharge, cramping.     Objective:   Physical Exam  Constitutional: She appears well-nourished. No distress.  HENT:  Head: Normocephalic and atraumatic.  Neck: Neck supple.  Abdominal: Soft. She exhibits no distension. There is no tenderness.  Neurological: She is alert.          Assessment & Plan:

## 2012-03-26 ENCOUNTER — Telehealth: Payer: Self-pay | Admitting: Family Medicine

## 2012-03-26 NOTE — Telephone Encounter (Signed)
Done. .Katherine Fritz  

## 2012-03-26 NOTE — Telephone Encounter (Signed)
Called pt to pick up letter. .Katherine Fritz  

## 2012-03-26 NOTE — Telephone Encounter (Signed)
Pt was given letter stating what her due date was, but it is supposed to be 11/28/12 not 12/18/12 - needs to pick this up at lunch time - pls call when ready

## 2012-04-02 ENCOUNTER — Inpatient Hospital Stay (HOSPITAL_COMMUNITY)
Admission: AD | Admit: 2012-04-02 | Discharge: 2012-04-02 | Disposition: A | Discharge status: Home / Self Care | Payer: Medicaid Other | Source: Ambulatory Visit | Attending: Obstetrics & Gynecology | Admitting: Obstetrics & Gynecology

## 2012-04-02 ENCOUNTER — Inpatient Hospital Stay (HOSPITAL_COMMUNITY): Payer: Medicaid Other

## 2012-04-02 ENCOUNTER — Encounter (HOSPITAL_COMMUNITY): Payer: Self-pay

## 2012-04-02 DIAGNOSIS — R109 Unspecified abdominal pain: Secondary | ICD-10-CM | POA: Insufficient documentation

## 2012-04-02 DIAGNOSIS — Z3490 Encounter for supervision of normal pregnancy, unspecified, unspecified trimester: Secondary | ICD-10-CM

## 2012-04-02 DIAGNOSIS — N939 Abnormal uterine and vaginal bleeding, unspecified: Secondary | ICD-10-CM

## 2012-04-02 DIAGNOSIS — N949 Unspecified condition associated with female genital organs and menstrual cycle: Secondary | ICD-10-CM | POA: Insufficient documentation

## 2012-04-02 DIAGNOSIS — O26859 Spotting complicating pregnancy, unspecified trimester: Secondary | ICD-10-CM | POA: Insufficient documentation

## 2012-04-02 HISTORY — DX: Reserved for concepts with insufficient information to code with codable children: IMO0002

## 2012-04-02 HISTORY — DX: Calculus of kidney: N20.0

## 2012-04-02 HISTORY — DX: Unspecified abnormal cytological findings in specimens from cervix uteri: R87.619

## 2012-04-02 HISTORY — DX: Methicillin resistant Staphylococcus aureus infection, unspecified site: A49.02

## 2012-04-02 LAB — WET PREP, GENITAL
Clue Cells Wet Prep HPF POC: NONE SEEN
Trich, Wet Prep: NONE SEEN
Yeast Wet Prep HPF POC: NONE SEEN

## 2012-04-02 LAB — CBC WITH DIFFERENTIAL/PLATELET
Basophils Absolute: 0 10*3/uL (ref 0.0–0.1)
Basophils Relative: 0 % (ref 0–1)
Eosinophils Absolute: 0.1 10*3/uL (ref 0.0–0.7)
MCH: 29.7 pg (ref 26.0–34.0)
MCHC: 33.5 g/dL (ref 30.0–36.0)
Neutro Abs: 6.9 10*3/uL (ref 1.7–7.7)
Neutrophils Relative %: 75 % (ref 43–77)
Platelets: 282 10*3/uL (ref 150–400)
RDW: 13.6 % (ref 11.5–15.5)

## 2012-04-02 LAB — URINALYSIS, ROUTINE W REFLEX MICROSCOPIC
Bilirubin Urine: NEGATIVE
Glucose, UA: NEGATIVE mg/dL
Specific Gravity, Urine: >1.03 — ABNORMAL HIGH (ref 1.005–1.030)
Urobilinogen, UA: 0.2 mg/dL (ref 0.0–1.0)
pH: 6 (ref 5.0–8.0)

## 2012-04-02 LAB — URINE MICROSCOPIC-ADD ON

## 2012-04-02 NOTE — MAU Provider Note (Signed)
Chief Complaint: Abdominal Pain and Vaginal Discharge   None    SUBJECTIVE HPI: Katherine Fritz is a 31 y.o. [phone] at [redacted]w[redacted]d by LMP who presents to maternity admissions reporting abdominal cramping x2-3 days and vaginal spotting this morning.  She reports cramping increased this am when she noticed pink and red when wiping and some red/brown in her underwear. Patient's last menstrual period was 02/22/2012. She denies vaginal itching/burning, urinary symptoms, h/a, dizziness, n/v, or fever/chills.    Past Medical History  Diagnosis Date  . MRSA infection   . Kidney stones   . Abnormal Pap smear    Past Surgical History  Procedure Date  . Colposcopy    History   Social History  . Marital Status: Married    Spouse Name: N/A    Number of Children: N/A  . Years of Education: N/A   Occupational History  . Not on file.   Social History Main Topics  . Smoking status: Former Smoker    Quit date: 09/04/2008  . Smokeless tobacco: Never Used  . Alcohol Use: No  . Drug Use: No  . Sexually Active: Yes    Birth Control/ Protection: None   Other Topics Concern  . Not on file   Social History Narrative  . No narrative on file   No current facility-administered medications on file prior to encounter.   Current Outpatient Prescriptions on File Prior to Encounter  Medication Sig Dispense Refill  . Prenatal Multivit-Min-Fe-FA (PRENATAL VITAMINS) 0.8 MG tablet Take 1 tablet by mouth daily.  30 tablet  11   Allergies  Allergen Reactions  . Penicillins     REACTION: Mouth swelling    ROS: Pertinent items in HPI  OBJECTIVE Blood pressure 111/64, pulse 75, temperature 97.9 F (36.6 C), temperature source Oral, resp. rate 18, height 5' 5.5" (1.664 m), weight 89.449 kg (197 lb 3.2 oz), last menstrual period 02/22/2012, SpO2 100.00%. GENERAL: Well-developed, well-nourished female in no acute distress.  HEENT: Normocephalic HEART: normal rate RESP: normal effort ABDOMEN: Soft,  non-tender EXTREMITIES: Nontender, no edema NEURO: Alert and oriented Pelvic exam: Cervix pink, visually closed, without lesion, scant brown blood, vaginal walls and external genitalia normal Bimanual exam: Cervix 0/long/high, firm, anterior, neg CMT, uterus nontender, slightly enlarged, adnexa without tenderness, enlargement, or mass  LAB RESULTS Results for orders placed during the hospital encounter of 04/02/12 (from the past 24 hour(s))  URINALYSIS, ROUTINE W REFLEX MICROSCOPIC     Status: Abnormal   Collection Time   04/02/12 10:00 AM      Component Value Range   Color, Urine YELLOW  YELLOW   APPearance CLEAR  CLEAR   Specific Gravity, Urine >1.030 (*) 1.005 - 1.030   pH 6.0  5.0 - 8.0   Glucose, UA NEGATIVE  NEGATIVE mg/dL   Hgb urine dipstick TRACE (*) NEGATIVE   Bilirubin Urine NEGATIVE  NEGATIVE   Ketones, ur NEGATIVE  NEGATIVE mg/dL   Protein, ur NEGATIVE  NEGATIVE mg/dL   Urobilinogen, UA 0.2  0.0 - 1.0 mg/dL   Nitrite NEGATIVE  NEGATIVE   Leukocytes, UA NEGATIVE  NEGATIVE  URINE MICROSCOPIC-ADD ON     Status: Normal   Collection Time   04/02/12 10:00 AM      Component Value Range   Squamous Epithelial / LPF RARE  RARE   RBC / HPF 0-2  <3 RBC/hpf  CBC WITH DIFFERENTIAL     Status: Normal   Collection Time   04/02/12 11:24 AM  Component Value Range   WBC 9.2  4.0 - 10.5 K/uL   RBC 4.11  3.87 - 5.11 MIL/uL   Hemoglobin 12.2  12.0 - 15.0 g/dL   HCT 40.9  81.1 - 91.4 %   MCV 88.6  78.0 - 100.0 fL   MCH 29.7  26.0 - 34.0 pg   MCHC 33.5  30.0 - 36.0 g/dL   RDW 78.2  95.6 - 21.3 %   Platelets 282  150 - 400 K/uL   Neutrophils Relative 75  43 - 77 %   Neutro Abs 6.9  1.7 - 7.7 K/uL   Lymphocytes Relative 19  12 - 46 %   Lymphs Abs 1.7  0.7 - 4.0 K/uL   Monocytes Relative 6  3 - 12 %   Monocytes Absolute 0.6  0.1 - 1.0 K/uL   Eosinophils Relative 1  0 - 5 %   Eosinophils Absolute 0.1  0.0 - 0.7 K/uL   Basophils Relative 0  0 - 1 %   Basophils Absolute 0.0  0.0 -  0.1 K/uL  HCG, QUANTITATIVE, PREGNANCY     Status: Abnormal   Collection Time   04/02/12 11:24 AM      Component Value Range   hCG, Beta Chain, Quant, S 08657 (*) <5 mIU/mL  WET PREP, GENITAL     Status: Abnormal   Collection Time   04/02/12 12:30 PM      Component Value Range   Yeast Wet Prep HPF POC NONE SEEN  NONE SEEN   Trich, Wet Prep NONE SEEN  NONE SEEN   Clue Cells Wet Prep HPF POC NONE SEEN  NONE SEEN   WBC, Wet Prep HPF POC FEW (*) NONE SEEN    IMAGING    ASSESSMENT 1. Normal IUP (intrauterine pregnancy) on prenatal ultrasound   2. Vaginal spotting     PLAN Discharge home with bleeding precautions [facility] pending F/U with early prenatal care Return to MAU as needed    Medication List     As of 04/02/2012 12:58 PM    ASK your doctor about these medications         acetaminophen 500 MG tablet   Commonly known as: TYLENOL   Take 500 mg by mouth every 6 (six) hours as needed. For cramping.      hydrocortisone cream 1 %   Apply 1 application topically 2 (two) times daily.      PRENATAL VITAMINS 0.8 MG tablet   Take 1 tablet by mouth daily.         Sharen Counter Certified Nurse-Midwife 04/02/2012  12:58 PM

## 2012-04-02 NOTE — MAU Note (Signed)
Patient states she has been having lower abdominal cramping for a few days that is getting worse. States this am had dark blood on tissue after wiping. Not wearing a pad at this time.

## 2012-04-03 LAB — GC/CHLAMYDIA PROBE AMP, GENITAL
Chlamydia, DNA Probe: NEGATIVE
GC Probe Amp, Genital: NEGATIVE

## 2012-04-28 ENCOUNTER — Encounter (HOSPITAL_COMMUNITY): Payer: Self-pay | Admitting: *Deleted

## 2012-04-28 ENCOUNTER — Ambulatory Visit: Payer: Medicaid Other | Admitting: Family Medicine

## 2012-04-28 ENCOUNTER — Other Ambulatory Visit: Payer: Medicaid Other

## 2012-04-28 ENCOUNTER — Telehealth: Payer: Self-pay | Admitting: *Deleted

## 2012-04-28 ENCOUNTER — Inpatient Hospital Stay (HOSPITAL_COMMUNITY)
Admission: AD | Admit: 2012-04-28 | Discharge: 2012-04-28 | Disposition: A | Discharge status: Home / Self Care | Payer: Medicaid Other | Source: Ambulatory Visit | Attending: Obstetrics and Gynecology | Admitting: Obstetrics and Gynecology

## 2012-04-28 DIAGNOSIS — Z6791 Unspecified blood type, Rh negative: Secondary | ICD-10-CM

## 2012-04-28 DIAGNOSIS — Z298 Encounter for other specified prophylactic measures: Secondary | ICD-10-CM | POA: Insufficient documentation

## 2012-04-28 DIAGNOSIS — O26859 Spotting complicating pregnancy, unspecified trimester: Secondary | ICD-10-CM

## 2012-04-28 DIAGNOSIS — Z2989 Encounter for other specified prophylactic measures: Secondary | ICD-10-CM | POA: Insufficient documentation

## 2012-04-28 DIAGNOSIS — Z3480 Encounter for supervision of other normal pregnancy, unspecified trimester: Secondary | ICD-10-CM

## 2012-04-28 DIAGNOSIS — O26851 Spotting complicating pregnancy, first trimester: Secondary | ICD-10-CM

## 2012-04-28 DIAGNOSIS — N949 Unspecified condition associated with female genital organs and menstrual cycle: Secondary | ICD-10-CM | POA: Insufficient documentation

## 2012-04-28 DIAGNOSIS — R109 Unspecified abdominal pain: Secondary | ICD-10-CM | POA: Insufficient documentation

## 2012-04-28 DIAGNOSIS — O26899 Other specified pregnancy related conditions, unspecified trimester: Secondary | ICD-10-CM

## 2012-04-28 LAB — URINALYSIS, ROUTINE W REFLEX MICROSCOPIC
Bilirubin Urine: NEGATIVE
Ketones, ur: NEGATIVE mg/dL
Nitrite: NEGATIVE
Protein, ur: NEGATIVE mg/dL
Specific Gravity, Urine: 1.025 (ref 1.005–1.030)
Urobilinogen, UA: 0.2 mg/dL (ref 0.0–1.0)

## 2012-04-28 MED ORDER — RHO D IMMUNE GLOBULIN 1500 UNIT/2ML IJ SOLN
300.0000 ug | Freq: Once | INTRAMUSCULAR | Status: AC
  Administered 2012-04-28: 300 ug via INTRAMUSCULAR
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
## 2012-04-28 NOTE — Progress Notes (Signed)
PRENATAL LABS DONE TODAY Katherine Fritz 

## 2012-04-29 LAB — SICKLE CELL SCREEN: Sickle Cell Screen: NEGATIVE

## 2012-04-30 LAB — OBSTETRIC PANEL
Antibody Screen: NEGATIVE
Basophils Absolute: 0 10*3/uL (ref 0.0–0.1)
Basophils Relative: 0 % (ref 0–1)
Eosinophils Absolute: 0.1 10*3/uL (ref 0.0–0.7)
Hepatitis B Surface Ag: NEGATIVE
MCH: 29.4 pg (ref 26.0–34.0)
MCHC: 34.2 g/dL (ref 30.0–36.0)
Monocytes Absolute: 0.6 10*3/uL (ref 0.1–1.0)
Monocytes Relative: 6 % (ref 3–12)
Neutro Abs: 7.5 10*3/uL (ref 1.7–7.7)
Neutrophils Relative %: 77 % (ref 43–77)
RDW: 13.4 % (ref 11.5–15.5)
Rh Type: NEGATIVE

## 2012-04-30 LAB — URINE CULTURE: Colony Count: 25000

## 2012-04-30 NOTE — MAU Provider Note (Signed)
Attestation of Attending Supervision of Advanced Practitioner (CNM/NP): Evaluation and management procedures were performed by the Advanced Practitioner under my supervision and collaboration.  I have reviewed the Advanced Practitioner's note and chart, and I agree with the management and plan.  Homer Miller 04/30/2012 8:57 AM   

## 2012-05-05 ENCOUNTER — Other Ambulatory Visit: Payer: Self-pay | Admitting: Family Medicine

## 2012-05-05 ENCOUNTER — Ambulatory Visit (INDEPENDENT_AMBULATORY_CARE_PROVIDER_SITE_OTHER): Payer: Medicaid Other | Admitting: Family Medicine

## 2012-05-05 ENCOUNTER — Encounter: Payer: Self-pay | Admitting: Family Medicine

## 2012-05-05 VITALS — BP 106/69 | Wt 201.0 lb

## 2012-05-05 DIAGNOSIS — Z22330 Carrier of Group B streptococcus: Secondary | ICD-10-CM | POA: Insufficient documentation

## 2012-05-05 DIAGNOSIS — Z23 Encounter for immunization: Secondary | ICD-10-CM

## 2012-05-05 DIAGNOSIS — Z3490 Encounter for supervision of normal pregnancy, unspecified, unspecified trimester: Secondary | ICD-10-CM

## 2012-05-05 DIAGNOSIS — Z3682 Encounter for antenatal screening for nuchal translucency: Secondary | ICD-10-CM

## 2012-05-05 DIAGNOSIS — Z3480 Encounter for supervision of other normal pregnancy, unspecified trimester: Secondary | ICD-10-CM

## 2012-05-05 LAB — RH IG WORKUP (INCLUDES ABO/RH)
ABO/RH(D): A NEG
Antibody Screen: NEGATIVE
Unit division: 0
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
## 2012-05-05 NOTE — Patient Instructions (Addendum)
Mrs. Biehler,   It was a pleasure to meet you. Below are the things that we discussed.   1. Blood Sugar Testing - Please schedule a time to come for a one hour test within the next week.   2. Genetic Testing - The lab will call you about setting up a time.   3. Weight - Keep exercising and eating nutritious. Also, keep taking the pre-natal vitamins.   Please follow up in 4 weeks.   Take Care,   Dr. Clinton Sawyer

## 2012-05-05 NOTE — Assessment & Plan Note (Signed)
GBS in urine; needs intrapartum antibiotics - consider sensitivity testing for Clinda since patient w/ PCN allergy

## 2012-05-10 ENCOUNTER — Encounter (HOSPITAL_COMMUNITY): Payer: Self-pay | Admitting: Family Medicine

## 2012-05-11 ENCOUNTER — Other Ambulatory Visit (INDEPENDENT_AMBULATORY_CARE_PROVIDER_SITE_OTHER): Payer: Medicaid Other

## 2012-05-11 DIAGNOSIS — Z3480 Encounter for supervision of other normal pregnancy, unspecified trimester: Secondary | ICD-10-CM

## 2012-05-11 LAB — GLUCOSE, CAPILLARY: Glucose-Capillary: 115 mg/dL — ABNORMAL HIGH (ref 70–99)

## 2012-05-11 NOTE — Progress Notes (Signed)
1 hr OB glucose = 115 mg/dL 

## 2012-05-11 NOTE — Progress Notes (Signed)
Note reviewed.  Agree with plan.  Issues noted: Rh negative- give Rhogam 04/28/12 for vaginal bleeding.  Ab screen negative prior to administration of Rhogam. GBS positive (bacteruria) - agree with plan to check sensitivities as pt is PCN allergic and will need intrapartum abx. Obesity - pt already counseled by Dr. Zella Richer.  Early glucola 115.  Needs routine glucose screening at 24-28 weeks.

## 2012-05-11 NOTE — Progress Notes (Signed)
1 hr OB glucose = 115 mg/dL

## 2012-05-20 ENCOUNTER — Other Ambulatory Visit: Payer: Self-pay

## 2012-05-20 ENCOUNTER — Ambulatory Visit (HOSPITAL_COMMUNITY)
Admission: RE | Admit: 2012-05-20 | Discharge: 2012-05-20 | Disposition: A | Discharge status: Home / Self Care | Payer: Medicaid Other | Source: Ambulatory Visit | Attending: Family Medicine | Admitting: Family Medicine

## 2012-05-20 VITALS — BP 117/73 | HR 87 | Wt 204.5 lb

## 2012-05-20 DIAGNOSIS — Z3682 Encounter for antenatal screening for nuchal translucency: Secondary | ICD-10-CM

## 2012-05-20 DIAGNOSIS — O360990 Maternal care for other rhesus isoimmunization, unspecified trimester, not applicable or unspecified: Secondary | ICD-10-CM | POA: Insufficient documentation

## 2012-05-20 DIAGNOSIS — Z298 Encounter for other specified prophylactic measures: Secondary | ICD-10-CM | POA: Insufficient documentation

## 2012-05-20 DIAGNOSIS — O3510X0 Maternal care for (suspected) chromosomal abnormality in fetus, unspecified, not applicable or unspecified: Secondary | ICD-10-CM | POA: Insufficient documentation

## 2012-05-20 DIAGNOSIS — Z3689 Encounter for other specified antenatal screening: Secondary | ICD-10-CM | POA: Insufficient documentation

## 2012-05-20 DIAGNOSIS — Z22330 Carrier of Group B streptococcus: Secondary | ICD-10-CM

## 2012-05-20 DIAGNOSIS — Z2989 Encounter for other specified prophylactic measures: Secondary | ICD-10-CM | POA: Insufficient documentation

## 2012-05-20 DIAGNOSIS — O351XX0 Maternal care for (suspected) chromosomal abnormality in fetus, not applicable or unspecified: Secondary | ICD-10-CM | POA: Insufficient documentation

## 2012-05-20 NOTE — Progress Notes (Signed)
Katherine Fritz  was seen today for an ultrasound appointment.  See full report in [facility].  Impression Single IUP at 12 4/7 weeks Nuchal translucency of 1.1 mm noted.  Nasal bone visualized First trimester aneuploidy screen performed as noted above.  Please do not draw triple/quad screen, though patient should be offered MSAFP for neural tube defect screening.   Recommendations Recommend follow-up ultrasound examination at 18 weeks for anatomy  Alpha Gula, MD

## 2012-05-25 ENCOUNTER — Encounter: Payer: Self-pay | Admitting: Family Medicine

## 2012-05-26 NOTE — L&D Delivery Note (Signed)
Delivery Note At 4:09 PM a viable female, "Katherine Fritz", was delivered via Vaginal, Spontaneous Delivery (Presentation: Right Occiput Anterior).  APGAR: 9, 9; weight .   Placenta status: Intact, Spontaneous.  Cord: 3 vessels with the following complications: None.  Cord pH: NA  Anesthesia: Epidural  Episiotomy: None Lacerations: None Suture Repair: None Est. Blood Loss (mL): 300 cc  Mom to postpartum.  Baby to skin to skin.Marland Kitchen  Nigel Bridgeman 12/03/2012, 4:46 PM

## 2012-05-31 ENCOUNTER — Other Ambulatory Visit (HOSPITAL_COMMUNITY)
Admission: RE | Admit: 2012-05-31 | Discharge: 2012-05-31 | Disposition: A | Discharge status: Home / Self Care | Payer: Medicaid Other | Source: Ambulatory Visit | Attending: Family Medicine | Admitting: Family Medicine

## 2012-05-31 ENCOUNTER — Ambulatory Visit (INDEPENDENT_AMBULATORY_CARE_PROVIDER_SITE_OTHER): Payer: Medicaid Other | Admitting: Family Medicine

## 2012-05-31 VITALS — BP 114/72 | Wt 202.0 lb

## 2012-05-31 DIAGNOSIS — N859 Noninflammatory disorder of uterus, unspecified: Secondary | ICD-10-CM

## 2012-05-31 DIAGNOSIS — O2390 Unspecified genitourinary tract infection in pregnancy, unspecified trimester: Secondary | ICD-10-CM

## 2012-05-31 DIAGNOSIS — B951 Streptococcus, group B, as the cause of diseases classified elsewhere: Secondary | ICD-10-CM

## 2012-05-31 DIAGNOSIS — N390 Urinary tract infection, site not specified: Secondary | ICD-10-CM

## 2012-05-31 DIAGNOSIS — N858 Other specified noninflammatory disorders of uterus: Secondary | ICD-10-CM

## 2012-05-31 DIAGNOSIS — Z113 Encounter for screening for infections with a predominantly sexual mode of transmission: Secondary | ICD-10-CM | POA: Insufficient documentation

## 2012-05-31 DIAGNOSIS — Z3480 Encounter for supervision of other normal pregnancy, unspecified trimester: Secondary | ICD-10-CM

## 2012-05-31 LAB — POCT WET PREP (WET MOUNT)

## 2012-05-31 NOTE — Patient Instructions (Addendum)
I will let you know if there are any abnormal results on the tests.   Please follow up in 4 weeks.   Sincerely,   Dr. Clinton Sawyer

## 2012-05-31 NOTE — Progress Notes (Signed)
32 year old G16P3003 who presents for routine prenatal care. She was recently evaluated for nuchal translucency which was normal, and her blood tests for the integrated screen were are all normal. She also notes that she has daily cramps lasting from a few minutes to 15 minutes, which she describes as minor. She denies any bleeding or leakage of fluids. O. BP 114/72  Wt 202 lb (91.627 kg)  LMP 02/22/2012   Gen: well appearing pregnant female   GU: EGBUS: no lesions Vagina: no blood in vault Cervix: 2 mm lesion or LUQ on of cervix; no mucopurulent d/c Uterus: small, mobile Adnexa: no masses; non tender   A/P:  32 year old G4P3003 @ [redacted]w[redacted]d - Obtain wet prep and culture to make sure no infection causing uterine irritability - GBS positive: obtain urine culture and sensitivity since PCN allergic  - Rh neg - Rhogam @ 28 weeks - F/u in 4 weeks and get AFP

## 2012-06-09 LAB — CULTURE, OB URINE

## 2012-06-18 ENCOUNTER — Telehealth: Payer: Self-pay | Admitting: Family Medicine

## 2012-06-18 NOTE — Telephone Encounter (Signed)
Explained to patient that as long she is not handling the mice or their droppings there is no risk of exposure to anything.  Advised her to set traps to eliminate them which she said her husband has already done.

## 2012-06-18 NOTE — Telephone Encounter (Signed)
Katherine Fritz is [redacted] weeks pregnant and has discovered that there are mice in her home.  Read online that they can carry disease and she is concerned about being infected while pregnant.  Please call her at earliest convenience to advise.

## 2012-06-28 ENCOUNTER — Ambulatory Visit (INDEPENDENT_AMBULATORY_CARE_PROVIDER_SITE_OTHER): Payer: Medicaid Other | Admitting: Family Medicine

## 2012-06-28 VITALS — BP 120/81 | Wt 210.0 lb

## 2012-06-28 DIAGNOSIS — Z3480 Encounter for supervision of other normal pregnancy, unspecified trimester: Secondary | ICD-10-CM

## 2012-06-28 DIAGNOSIS — Z3490 Encounter for supervision of normal pregnancy, unspecified, unspecified trimester: Secondary | ICD-10-CM

## 2012-06-28 DIAGNOSIS — Z331 Pregnant state, incidental: Secondary | ICD-10-CM

## 2012-06-28 NOTE — Patient Instructions (Signed)
Dear Ms. Dezeeuw,   I am glad that you are doing well. I think everything is going well. Please continue your prenatal vitamins and try to get regular exercise. I will call you about the results of your ultrasound.   Schedule an appointment in 4 weeks with the [facility] clinic.   Take Care,   Dr. Clinton Sawyer

## 2012-06-28 NOTE — Progress Notes (Signed)
32 year old F G4P3003 @ [redacted]w[redacted]d who presents for routine prenatal care.  CC: 1) swelling of labia - bilateral, non painful, intermittent, mucousy discharge, no fevers, no history of bartholin cyst         2) Intermittent abdominal cramping - every other day, present at previous visit, so patient tested for UTI, GC/CT, and wet pret - all negative; no hx of pre-term labor O: BP 120/81  Wt 210 lb (95.255 kg)  LMP 02/22/2012 Gen: well appearing, non distressed,  Abd: gravid, non-tender, HEENT GU: External: no lesions, no cysts, mild engorgement of labia majora bilaterally Cervix: anterior; long, thick, closed  A/P: 32 year old F G4P3003 @ [redacted]w[redacted]d with normal external genitalia and no signs of pre-term labor.  - Set up for normal pre-natal u/s - GBS pos: results of sensitivity being faxed since patient PCN allergic - Rh neg - Rho gam @ 28 weeks - F/u in 4 weeks

## 2012-06-30 ENCOUNTER — Telehealth: Payer: Self-pay | Admitting: Family Medicine

## 2012-06-30 ENCOUNTER — Ambulatory Visit (HOSPITAL_COMMUNITY)
Admission: RE | Admit: 2012-06-30 | Discharge: 2012-06-30 | Disposition: A | Discharge status: Home / Self Care | Payer: Medicaid Other | Source: Ambulatory Visit | Attending: Family Medicine | Admitting: Family Medicine

## 2012-06-30 DIAGNOSIS — Z3490 Encounter for supervision of normal pregnancy, unspecified, unspecified trimester: Secondary | ICD-10-CM

## 2012-06-30 DIAGNOSIS — O3510X0 Maternal care for (suspected) chromosomal abnormality in fetus, unspecified, not applicable or unspecified: Secondary | ICD-10-CM | POA: Insufficient documentation

## 2012-06-30 DIAGNOSIS — O351XX0 Maternal care for (suspected) chromosomal abnormality in fetus, not applicable or unspecified: Secondary | ICD-10-CM | POA: Insufficient documentation

## 2012-06-30 DIAGNOSIS — Z3689 Encounter for other specified antenatal screening: Secondary | ICD-10-CM | POA: Insufficient documentation

## 2012-06-30 NOTE — Telephone Encounter (Signed)
Patient called to inform her that anatomy ultrasound is normal an demonstrates a female fetus.

## 2012-07-21 ENCOUNTER — Encounter: Payer: Self-pay | Admitting: Family Medicine

## 2012-07-29 ENCOUNTER — Ambulatory Visit (INDEPENDENT_AMBULATORY_CARE_PROVIDER_SITE_OTHER): Payer: Medicaid Other | Admitting: Family Medicine

## 2012-07-29 VITALS — BP 118/59 | Temp 98.5°F | Wt 219.0 lb

## 2012-07-29 DIAGNOSIS — R350 Frequency of micturition: Secondary | ICD-10-CM

## 2012-07-29 DIAGNOSIS — IMO0001 Reserved for inherently not codable concepts without codable children: Secondary | ICD-10-CM

## 2012-07-29 DIAGNOSIS — N898 Other specified noninflammatory disorders of vagina: Secondary | ICD-10-CM

## 2012-07-29 LAB — POCT URINALYSIS DIPSTICK
Bilirubin, UA: NEGATIVE
Ketones, UA: NEGATIVE
Leukocytes, UA: NEGATIVE
Spec Grav, UA: >=1.03
pH, UA: 6

## 2012-07-29 LAB — POCT WET PREP (WET MOUNT): Clue Cells Wet Prep Whiff POC: NEGATIVE

## 2012-07-29 MED ORDER — CEPHALEXIN 250 MG PO TABS: 250.0000 mg | ORAL_TABLET | Freq: Four times a day (QID) | ORAL | Status: DC

## 2012-07-29 NOTE — Progress Notes (Signed)
Patient seen in OB Clinic at 22w 4d by LMP c/w US done at [redacted]w[redacted]d.  Feels well.  Does report that she has experienced watery odorless liquid that has soaked her underwear on a few occasions in the past 2 weeks.  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  No dysuria but has had more frequent urination.   Sterile speculum exam today reveals no pooling and negative nitrazine, ferning pending.  Scant white discharge in vagina.  Cervix non-friable. Of note, patient with prior detection of GBS on urine culture in counts that do not meet the definition of asymptomatic bacteruria.  She reports allergy to PCN (told this in childhood, unsure of her reaction to penicillin), but has been able to take amoxicillin without a problem.  Will treat her with Keflex for prior GBS in urine; will also need intrapartum abx for GBS-positive.  I am ordering a UA and UCx today, will call patient at her cell [phone] with results of these. Wet prep today as well. Follow up in 4 weeks; will need antibody screen and RhoGam at 28 weeks' gestation.  Discussed preterm labor precautions, kick counts.  Paula Compton, MD

## 2012-07-29 NOTE — Patient Instructions (Addendum)
It was a pleasure to see you today in [facility].  You are 22 weeks and 4 days today.   Our exam today for possible fluid leak is NEGATIVE; we are sending another urine culture today, as urine leak is the most likely reason for the fluid you experienced.   Given that your prior urine cultures grew the bacteria GROUP B STREP, we are treating you today for this with cephalexin 250mg  tablets, take 1 tablet 4 times daily for 7 days.  I sent this to [facility] on [address].  You will still need antibiotic treatment when you show up in labor at the hospital.  I will call you with the lab results (urine, vaginal discharge) at your cell at [phone].  FOLLOW UP IN 4 WEEKS WITH DR Clinton Sawyer FOR YOUR NEXT PRENATAL VISIT.  Remember the kick count instructions we talked about:  Fetal Movement Counts Patient Name: __________________________________________________ Patient Due Date: ____________________ Melody Haver counts is highly recommended in high risk pregnancies, but it is a good idea for every pregnant woman to do. Start counting fetal movements at 28 weeks of the pregnancy. Fetal movements increase after eating a full meal or eating or drinking something sweet (the blood sugar is higher). It is also important to drink plenty of fluids (well hydrated) before doing the count. Lie on your left side because it helps with the circulation or you can sit in a comfortable chair with your arms over your belly (abdomen) with no distractions around you. DOING THE COUNT  Try to do the count the same time of day each time you do it.  Mark the day and time, then see how long it takes for you to feel 10 movements (kicks, flutters, swishes, rolls). You should have at least 10 movements within 2 hours. You will most likely feel 10 movements in much less than 2 hours. If you do not, wait an hour and count again. After a couple of days you will see a pattern.  What you are looking for is a change in the pattern or not enough  counts in 2 hours. Is it taking longer in time to reach 10 movements? SEEK MEDICAL CARE IF:  You feel less than 10 counts in 2 hours. Tried twice.  No movement in one hour.  The pattern is changing or taking longer each day to reach 10 counts in 2 hours.  You feel the baby is not moving as it usually does.   Preterm Labor Preterm labor is when labor starts at less than 37 weeks of pregnancy. The normal length of a pregnancy is 39 to 41 weeks. CAUSES Often, there is no identifiable underlying cause as to why a woman goes into preterm labor. However, one of the most common known causes of preterm labor is infection. Infections of the uterus, cervix, vagina, amniotic sac, bladder, kidney, or even the lungs (pneumonia) can cause labor to start. Other causes of preterm labor include:  Urogenital infections, such as yeast infections and bacterial vaginosis.  Uterine abnormalities (uterine shape, uterine septum, fibroids, bleeding from the placenta).  A cervix that has been operated on and opens prematurely.  Malformations in the baby.  Multiple gestations (twins, triplets, and so on).  Breakage of the amniotic sac. Additional risk factors for preterm labor include:  Previous history of preterm labor.  Premature rupture of membranes (PROM).  A placenta that covers the opening of the cervix (placenta previa).  A placenta that separates from the uterus (placenta abruption).  A cervix that is too weak to hold the baby in the uterus (incompetence cervix).  Having too much fluid in the amniotic sac (polyhydramnios).  Taking illegal drugs or smoking while pregnant.  Not gaining enough weight while pregnant.  Women younger than 74 and older than 33 years old.  Low socioeconomic status.  African-American ethnicity. SYMPTOMS Signs and symptoms of preterm labor include:  Menstrual-like cramps.  Contractions that are 30 to 70 seconds apart, become very regular, closer together,  and are more intense and painful.  Contractions that start on the top of the uterus and spread down to the lower abdomen and back.  A sense of increased pelvic pressure or back pain.  A watery or bloody discharge that comes from the vagina. DIAGNOSIS  A diagnosis can be confirmed by:  A vaginal exam.  An ultrasound of the cervix.  Sampling (swabbing) cervico-vaginal secretions. These samples can be tested for the presence of fetal fibronectin. This is a protein found in cervical discharge which is associated with preterm labor.  Fetal monitoring. TREATMENT  Depending on the length of the pregnancy and other circumstances, a caregiver may suggest bed rest. If necessary, there are medicines that can be given to stop contractions and to quicken fetal lung maturity. If labor happens before 34 weeks of pregnancy, a prolonged hospital stay may be recommended. Treatment depends on the condition of both the mother and baby. PREVENTION There are some things a mother can do to lower the risk of preterm labor in future pregnancies. A woman can:   Stop smoking.  Maintain healthy weight gain and avoid chemicals and drugs that are not necessary.  Be watchful for any type of infection.  Inform her caregiver if she has a known history of preterm labor.

## 2012-07-30 ENCOUNTER — Telehealth: Payer: Self-pay | Admitting: Family Medicine

## 2012-07-30 NOTE — Telephone Encounter (Signed)
[phone] (M) Called patient's mobile to report negtive UA and ferning tests.  Left voice message. JB

## 2012-08-10 ENCOUNTER — Encounter: Payer: Self-pay | Admitting: Family Medicine

## 2012-08-11 ENCOUNTER — Telehealth: Payer: Self-pay | Admitting: Family Medicine

## 2012-08-11 NOTE — Telephone Encounter (Signed)
Katherine Fritz noticed on the paper that was completed by you for her dental referral showed that she could take penicillin as one of them meds,but her chart clearly shows that she is allergic to that and any of those in that same family.   Please refax form reflect the patients allergy to this

## 2012-08-12 ENCOUNTER — Encounter: Payer: Self-pay | Admitting: Family Medicine

## 2012-08-12 NOTE — Telephone Encounter (Signed)
Patient confirmed that she is having appointment on Friday.

## 2012-09-03 ENCOUNTER — Encounter: Payer: Medicaid Other | Admitting: Family Medicine

## 2012-09-08 ENCOUNTER — Encounter (HOSPITAL_COMMUNITY): Payer: Self-pay | Admitting: *Deleted

## 2012-09-08 ENCOUNTER — Inpatient Hospital Stay (HOSPITAL_COMMUNITY)
Admission: AD | Admit: 2012-09-08 | Discharge: 2012-09-08 | Disposition: A | Discharge status: Home / Self Care | Payer: Medicaid Other | Source: Ambulatory Visit | Attending: Obstetrics and Gynecology | Admitting: Obstetrics and Gynecology

## 2012-09-08 DIAGNOSIS — N949 Unspecified condition associated with female genital organs and menstrual cycle: Secondary | ICD-10-CM | POA: Insufficient documentation

## 2012-09-08 DIAGNOSIS — O4700 False labor before 37 completed weeks of gestation, unspecified trimester: Secondary | ICD-10-CM | POA: Insufficient documentation

## 2012-09-08 DIAGNOSIS — O26893 Other specified pregnancy related conditions, third trimester: Secondary | ICD-10-CM

## 2012-09-08 DIAGNOSIS — R102 Pelvic and perineal pain unspecified side: Secondary | ICD-10-CM

## 2012-09-08 DIAGNOSIS — O368131 Decreased fetal movements, third trimester, fetus 1: Secondary | ICD-10-CM

## 2012-09-08 DIAGNOSIS — O368190 Decreased fetal movements, unspecified trimester, not applicable or unspecified: Secondary | ICD-10-CM | POA: Insufficient documentation

## 2012-09-08 DIAGNOSIS — Z22330 Carrier of Group B streptococcus: Secondary | ICD-10-CM

## 2012-09-08 LAB — URINALYSIS, ROUTINE W REFLEX MICROSCOPIC
Glucose, UA: NEGATIVE mg/dL
Ketones, ur: NEGATIVE mg/dL
Leukocytes, UA: NEGATIVE
pH: 6 (ref 5.0–8.0)

## 2012-09-08 LAB — WET PREP, GENITAL
Clue Cells Wet Prep HPF POC: NONE SEEN
Trich, Wet Prep: NONE SEEN

## 2012-09-08 LAB — FETAL FIBRONECTIN: Fetal Fibronectin: NEGATIVE

## 2012-09-08 MED ORDER — CYCLOBENZAPRINE HCL 10 MG PO TABS: 10.0000 mg | ORAL_TABLET | Freq: Three times a day (TID) | ORAL | Status: DC | PRN

## 2012-09-08 NOTE — MAU Note (Signed)
Patient state she has been having pelvic pain/pressure and has not felt as much fetal movement for several days. Denies bleeding or leaking but does report a heavier vaginal discharge

## 2012-09-08 NOTE — MAU Provider Note (Signed)
History   32 yo G4P3003 at [redacted]w[redacted]d presents to MAU after calling the office for pelvic pressure and sharp pain; increased vaginal d/c. Reports decreased FM. Denies VB or LOF, UCs, or back pain.  Was seen in the office a week ago for similar complaints but reports that the pressure/pain has become worse today.  Pt seen in the office 4/10 and treated with Flagyl 500mg  QD x 7 days - pt is still taking medication.  H/o PTL with hospital admission in 2000 with Term Delivery  Chief Complaint  Patient presents with  . Pelvic Pain  . Decreased Fetal Movement   Patient Active Problem List  Diagnosis  . OBESITY  . MOOD SWINGS  . DEPRESSIVE DISORDER, NOS  . RH FACTOR, NEGATIVE  . ABNORMAL PAP SMEAR, LGSIL  . ANEMIA, IRON DEFICIENCY, HX OF  . Tinea pedis  . Supervision of normal pregnancy  . GBS carrier     OB History   Grav Para Term Preterm Abortions TAB SAB Ect Mult Living   4 3 3       3       Past Medical History  Diagnosis Date  . MRSA infection   . Kidney stones 2009  . Abnormal Pap smear     Colposcopy normal  . Depression     Not on current treatment    Past Surgical History  Procedure Laterality Date  . Colposcopy      Family History  Problem Relation Age of Onset  . COPD Mother   . Hypertension Mother   . Heart disease Mother   . Hyperlipidemia Sister   . Hypertension Sister   . Depression Brother   . Drug abuse Brother   . Cancer Paternal Aunt   . Cancer Paternal Uncle     History  Substance Use Topics  . Smoking status: Former Smoker    Quit date: 09/04/2008  . Smokeless tobacco: Never Used  . Alcohol Use: No    Allergies:  Allergies  Allergen Reactions  . Penicillins Swelling and Rash    Swelling is of the mouth    Prescriptions prior to admission  Medication Sig Dispense Refill  . metroNIDAZOLE (FLAGYL) 500 MG tablet Take 500 mg by mouth 2 (two) times daily.      . Prenatal Vit-Fe Fumarate-FA (PRENATAL MULTIVITAMIN) TABS Take 1 tablet by  mouth daily at 12 noon.        ROS: see HPI above, all other systems neg  Physical Exam   Blood pressure 112/65, pulse 87, temperature 98.1 F (36.7 C), temperature source Oral, resp. rate 16, height 5\' 5"  (1.651 m), weight 223 lb (101.152 kg), last menstrual period 02/22/2012, SpO2 100.00%.  Chest: Clear Heart: RRR Abdomen: gravid, NT Extremities: WNL  Pelvic: C / TH / High / Posterior,  no VB noted  FHT: Reactive NST UC: none traced  ED Course  IUP at [redacted]w[redacted]d Pelvic pressure H/o PTL  FFN - pending Wet prep - Neg UA - Few bacteria noted  Urine sent for culture Report off to [facility]  .Marland Kitchen Results for orders placed during the hospital encounter of 09/08/12 (from the past 24 hour(s))  URINALYSIS, ROUTINE W REFLEX MICROSCOPIC     Status: Abnormal   Collection Time    09/08/12  3:50 PM      Result Value Range   Color, Urine YELLOW  YELLOW   APPearance CLEAR  CLEAR   Specific Gravity, Urine 1.025  1.005 - 1.030   pH 6.0  5.0 - 8.0   Glucose, UA NEGATIVE  NEGATIVE mg/dL   Hgb urine dipstick SMALL (*) NEGATIVE   Bilirubin Urine NEGATIVE  NEGATIVE   Ketones, ur NEGATIVE  NEGATIVE mg/dL   Protein, ur NEGATIVE  NEGATIVE mg/dL   Urobilinogen, UA 0.2  0.0 - 1.0 mg/dL   Nitrite NEGATIVE  NEGATIVE   Leukocytes, UA NEGATIVE  NEGATIVE  URINE MICROSCOPIC-ADD ON     Status: Abnormal   Collection Time    09/08/12  3:50 PM      Result Value Range   Squamous Epithelial / LPF RARE  RARE   WBC, UA 0-2  <3 WBC/hpf   RBC / HPF 7-10  <3 RBC/hpf   Bacteria, UA FEW (*) RARE  FETAL FIBRONECTIN     Status: None   Collection Time    09/08/12  6:10 PM      Result Value Range   Fetal Fibronectin NEGATIVE  NEGATIVE  WET PREP, GENITAL     Status: Abnormal   Collection Time    09/08/12  6:10 PM      Result Value Range   Yeast Wet Prep HPF POC NONE SEEN  NONE SEEN   Trich, Wet Prep NONE SEEN  NONE SEEN   Clue Cells Wet Prep HPF POC NONE SEEN  NONE SEEN   WBC, Wet Prep HPF POC  MANY (*) NONE SEEN     Haroldine Laws CNM, MSN 09/08/2012 6:25 PM  Addendum: Labs WNL (see above).  Pt desires Rx for Pain.  Reports h/o kidney stones in past, and poor water drinker.  Disc'd urine sent for cx secondary to small Hgb.  Continue Flagyl.  Reactive NST. D/c home w/ PTL precautions and FKC.  Keep appt next Wed at [facility], or f/u prn.  Rx given for prn Flexeril.  Also disc'd Motrin until 30 weeks prn pain. Enc'd adequate po hydration, primarily water.  Rexene Edison, CNM 09/08/12 2002

## 2012-09-09 LAB — URINE CULTURE
Colony Count: NO GROWTH
Culture: NO GROWTH

## 2012-10-22 ENCOUNTER — Inpatient Hospital Stay (HOSPITAL_COMMUNITY)
Admission: AD | Admit: 2012-10-22 | Discharge: 2012-10-23 | Disposition: A | Discharge status: Home / Self Care | Payer: Medicaid Other | Source: Ambulatory Visit | Attending: Obstetrics and Gynecology | Admitting: Obstetrics and Gynecology

## 2012-10-22 DIAGNOSIS — N949 Unspecified condition associated with female genital organs and menstrual cycle: Secondary | ICD-10-CM | POA: Insufficient documentation

## 2012-10-22 DIAGNOSIS — O99891 Other specified diseases and conditions complicating pregnancy: Secondary | ICD-10-CM | POA: Insufficient documentation

## 2012-10-22 DIAGNOSIS — R109 Unspecified abdominal pain: Secondary | ICD-10-CM | POA: Insufficient documentation

## 2012-10-23 LAB — URINALYSIS, ROUTINE W REFLEX MICROSCOPIC
Bilirubin Urine: NEGATIVE
Ketones, ur: NEGATIVE mg/dL
Nitrite: NEGATIVE
Urobilinogen, UA: 0.2 mg/dL (ref 0.0–1.0)

## 2012-10-23 MED ORDER — CYCLOBENZAPRINE HCL 10 MG PO TABS
10.0000 mg | ORAL_TABLET | Freq: Once | ORAL | Status: AC
  Administered 2012-10-23: 10 mg via ORAL
  Filled 2012-10-23: qty 1

## 2012-10-23 MED ORDER — ACETAMINOPHEN 325 MG PO TABS
650.0000 mg | ORAL_TABLET | Freq: Once | ORAL | Status: AC
  Administered 2012-10-23: 650 mg via ORAL
  Filled 2012-10-23: qty 2

## 2012-10-23 NOTE — MAU Note (Signed)
Patient presents with c/o pelvic presure and pain in upper right abdomen. -

## 2012-10-23 NOTE — MAU Provider Note (Signed)
  History     CSN: [id number]  Arrival date and time: 10/22/12 2352   First Provider Initiated Contact with Patient 10/23/12 [phone]      Chief Complaint  Patient presents with  . Abdominal Pain   HPI Comments: Pt is a G4P3 at 34wks that arrives unannounced w c/o vague abdominal pain/ctx, denies any VB or LOF, reports GFM, states she is just "uncomfortable"      Past Medical History  Diagnosis Date  . MRSA infection   . Kidney stones 2009  . Abnormal Pap smear     Colposcopy normal  . Depression     Not on current treatment    Past Surgical History  Procedure Laterality Date  . Colposcopy      Family History  Problem Relation Age of Onset  . COPD Mother   . Hypertension Mother   . Heart disease Mother   . Hyperlipidemia Sister   . Hypertension Sister   . Depression Brother   . Drug abuse Brother   . Cancer Paternal Aunt   . Cancer Paternal Uncle     History  Substance Use Topics  . Smoking status: Former Smoker    Quit date: 09/04/2008  . Smokeless tobacco: Never Used  . Alcohol Use: No    Allergies:  Allergies  Allergen Reactions  . Penicillins Swelling and Rash    Swelling is of the mouth    Prescriptions prior to admission  Medication Sig Dispense Refill  . Prenatal Vit-Fe Fumarate-FA (PRENATAL MULTIVITAMIN) TABS Take 1 tablet by mouth daily at 12 noon.      . cyclobenzaprine (FLEXERIL) 10 MG tablet Take 1 tablet (10 mg total) by mouth every 8 (eight) hours as needed for muscle spasms.  30 tablet  1  . metroNIDAZOLE (FLAGYL) 500 MG tablet Take 500 mg by mouth 2 (two) times daily.        Review of Systems  All other systems reviewed and are negative.   Physical Exam   Blood pressure 91/63, pulse 103, temperature 98.4 F (36.9 C), temperature source Oral, resp. rate 18, last menstrual period 02/22/2012.  Physical Exam  Nursing note and vitals reviewed. Constitutional: She is oriented to person, place, and time. She appears well-developed and  well-nourished.  HENT:  Head: Normocephalic.  Eyes: Pupils are equal, round, and reactive to light.  Neck: Normal range of motion.  Cardiovascular: Normal rate, regular rhythm and normal heart sounds.   Respiratory: Effort normal and breath sounds normal.  GI: Soft. Bowel sounds are normal.  Genitourinary: Vagina normal.  Musculoskeletal: Normal range of motion.  Neurological: She is alert and oriented to person, place, and time. She has normal reflexes.  Skin: Skin is warm and dry.  Psychiatric: She has a normal mood and affect. Her behavior is normal.  FHR cat 1 toco quiet   Cervix closed   UA was concentrated 1.030 sp.grav, otherwise normal MAU Course  Procedures    Assessment and Plan  IUP at 34wks No evidence PTL  No evidence of any acute process  Pt tolerated PO fluids and light diet in MAU  Ready for discharge Discharged home in stable condition, rv'd FKC and PTL, keep routine f/u   Cherolyn Behrle M 10/23/2012, 12:33 AM

## 2012-11-04 ENCOUNTER — Encounter (HOSPITAL_COMMUNITY): Payer: Self-pay | Admitting: *Deleted

## 2012-11-04 ENCOUNTER — Inpatient Hospital Stay (HOSPITAL_COMMUNITY)
Admission: AD | Admit: 2012-11-04 | Discharge: 2012-11-04 | Disposition: A | Discharge status: Home / Self Care | Payer: Medicaid Other | Source: Ambulatory Visit | Attending: Obstetrics and Gynecology | Admitting: Obstetrics and Gynecology

## 2012-11-04 DIAGNOSIS — Z3493 Encounter for supervision of normal pregnancy, unspecified, third trimester: Secondary | ICD-10-CM

## 2012-11-04 DIAGNOSIS — Z22330 Carrier of Group B streptococcus: Secondary | ICD-10-CM

## 2012-11-04 DIAGNOSIS — O4700 False labor before 37 completed weeks of gestation, unspecified trimester: Secondary | ICD-10-CM | POA: Insufficient documentation

## 2012-11-04 NOTE — MAU Provider Note (Signed)
  History     CSN: [id number]  Arrival date and time: 11/04/12 2007   None     Chief Complaint  Patient presents with  . Contractions   HPI Comments: PT is a G4P3 at [redacted]w[redacted]d that arrived unannounced for labor check. Was 3cm in office, denies LOF or VB, reports +FM.      Past Medical History  Diagnosis Date  . MRSA infection   . Kidney stones 2009  . Abnormal Pap smear     Colposcopy normal  . Depression     Not on current treatment    Past Surgical History  Procedure Laterality Date  . Colposcopy      Family History  Problem Relation Age of Onset  . COPD Mother   . Hypertension Mother   . Heart disease Mother   . Hyperlipidemia Sister   . Hypertension Sister   . Depression Brother   . Drug abuse Brother   . Cancer Paternal Aunt   . Cancer Paternal Uncle     History  Substance Use Topics  . Smoking status: Former Smoker    Quit date: 09/04/2008  . Smokeless tobacco: Never Used  . Alcohol Use: No    Allergies:  Allergies  Allergen Reactions  . Penicillins Swelling and Rash    Swelling is of the mouth    Prescriptions prior to admission  Medication Sig Dispense Refill  . cyclobenzaprine (FLEXERIL) 10 MG tablet Take 1 tablet (10 mg total) by mouth every 8 (eight) hours as needed for muscle spasms.  30 tablet  1  . Prenatal Vit-Fe Fumarate-FA (PRENATAL MULTIVITAMIN) TABS Take 1 tablet by mouth daily at 12 noon.        Review of Systems  All other systems reviewed and are negative.   Physical Exam   Blood pressure 117/75, pulse 87, temperature 98.4 F (36.9 C), temperature source Oral, resp. rate 18, height 5\' 5"  (1.651 m), weight 233 lb 12.8 oz (106.051 kg), last menstrual period 02/22/2012.  Physical Exam  Nursing note and vitals reviewed. Constitutional: She is oriented to person, place, and time. She appears well-developed and well-nourished.  HENT:  Head: Normocephalic.  Eyes: Pupils are equal, round, and reactive to light.  Neck: Normal  range of motion.  Cardiovascular: Normal rate, regular rhythm and normal heart sounds.   Respiratory: Effort normal and breath sounds normal.  GI: Soft. Bowel sounds are normal.  Genitourinary: Vagina normal.  Musculoskeletal: Normal range of motion.  Neurological: She is alert and oriented to person, place, and time. She has normal reflexes.  Skin: Skin is warm and dry.  Psychiatric: She has a normal mood and affect. Her behavior is normal.    MAU Course  Procedures    Assessment and Plan  IUP at [redacted]w[redacted]d FHR reactive toco irreg Not in active labor Dc home, rv'd FKC and labor sx's F/u routine   Jobin Montelongo M 11/04/2012, 10:04 PM

## 2012-11-04 NOTE — MAU Note (Signed)
Pt reports having ctx on and off all day. C/o back pain and pelvic pressure. Seen in office yesterday 3/80/-1 GBS +. Reports loosing her mucus plug no gush of fluid. Fetal movement a little less than usual.

## 2012-11-04 NOTE — MAU Note (Signed)
Gevena Barre CNM notified of pt SVE.  Will continue to monitor and recheck in one hour.

## 2012-11-06 ENCOUNTER — Inpatient Hospital Stay (HOSPITAL_COMMUNITY)
Admission: AD | Admit: 2012-11-06 | Discharge: 2012-11-07 | Disposition: A | Discharge status: Home / Self Care | Payer: Medicaid Other | Source: Ambulatory Visit | Attending: Obstetrics and Gynecology | Admitting: Obstetrics and Gynecology

## 2012-11-06 ENCOUNTER — Encounter (HOSPITAL_COMMUNITY): Payer: Self-pay

## 2012-11-06 DIAGNOSIS — Z22330 Carrier of Group B streptococcus: Secondary | ICD-10-CM | POA: Insufficient documentation

## 2012-11-06 DIAGNOSIS — O4703 False labor before 37 completed weeks of gestation, third trimester: Secondary | ICD-10-CM

## 2012-11-06 DIAGNOSIS — O4700 False labor before 37 completed weeks of gestation, unspecified trimester: Secondary | ICD-10-CM | POA: Insufficient documentation

## 2012-11-06 DIAGNOSIS — O99891 Other specified diseases and conditions complicating pregnancy: Secondary | ICD-10-CM | POA: Insufficient documentation

## 2012-11-06 LAB — AMNISURE RUPTURE OF MEMBRANE (ROM) NOT AT ARMC: Amnisure ROM: NEGATIVE

## 2012-11-06 NOTE — MAU Note (Signed)
Pt went to noticed some leaking around 2100. States it did not look or smell like urine. Denies vaginal bleeding. States some UC about 6 minutes apart.

## 2012-11-06 NOTE — MAU Note (Signed)
History   32 yo, G4P3003 at [redacted]w[redacted]d presents to MAU with c/o LOF.  UCs that have not change over the last week.  Denies VB,  recent fever, resp or GI c/o's, UTI or PIH s/s. GFM.   UCs felt in lower back since being in MAU.  Chief Complaint  Patient presents with  . Rupture of Membranes    OB History   Grav Para Term Preterm Abortions TAB SAB Ect Mult Living   4 3 3       3       Past Medical History  Diagnosis Date  . MRSA infection   . Kidney stones 2009  . Abnormal Pap smear     Colposcopy normal  . Depression     Not on current treatment    Past Surgical History  Procedure Laterality Date  . Colposcopy      Family History  Problem Relation Age of Onset  . COPD Mother   . Hypertension Mother   . Heart disease Mother   . Hyperlipidemia Sister   . Hypertension Sister   . Depression Brother   . Drug abuse Brother   . Cancer Paternal Aunt   . Cancer Paternal Uncle     History  Substance Use Topics  . Smoking status: Former Smoker    Quit date: 09/04/2008  . Smokeless tobacco: Never Used  . Alcohol Use: No    Allergies:  Allergies  Allergen Reactions  . Penicillins Swelling and Rash    Swelling is of the mouth    Prescriptions prior to admission  Medication Sig Dispense Refill  . cyclobenzaprine (FLEXERIL) 10 MG tablet Take 1 tablet (10 mg total) by mouth every 8 (eight) hours as needed for muscle spasms.  30 tablet  1  . Prenatal Vit-Fe Fumarate-FA (PRENATAL MULTIVITAMIN) TABS Take 1 tablet by mouth daily at 12 noon.        ROS: see HPI above, all other systems are negative   Physical Exam   Pulse 71, temperature 98.3 F (36.8 C), temperature source Oral, resp. rate 20, height 5\' 5"  (1.651 m), weight 235 lb 3.2 oz (106.686 kg), last menstrual period 02/22/2012, SpO2 99.00%.  Chest: Clear Heart: RRR Abdomen: gravid, NT Extremities: WNL  Pelvic: Dilation: 3 Effacement (%): 50 Cervical Position: Posterior Station: -3 Presentation: Vertex Exam  by:: J Kenechukwu Eckstein CNM  FHT: NST reactive UCs: irregular  ED Course  IUP at [redacted]w[redacted]d Back labor UCs increasing GBS pos  Recheck cervix Dilation: 3 Effacement (%): 50 Cervical Position: Posterior Station: -3 Presentation: Vertex Exam by:: J Delphina Schum CNM No change  Back labor UCs have lessened  D/c home with labor precautions Encouraged to call or come in if labor sx increase - d/t h/o rapid delivery   Haroldine Laws CNM, MSN 11/06/2012 11:56 PM

## 2012-11-19 ENCOUNTER — Inpatient Hospital Stay (HOSPITAL_COMMUNITY)
Admission: AD | Admit: 2012-11-19 | Discharge: 2012-11-19 | Disposition: A | Discharge status: Home / Self Care | Payer: Medicaid Other | Source: Ambulatory Visit | Attending: Obstetrics and Gynecology | Admitting: Obstetrics and Gynecology

## 2012-11-19 ENCOUNTER — Encounter (HOSPITAL_COMMUNITY): Payer: Self-pay | Admitting: *Deleted

## 2012-11-19 DIAGNOSIS — Z22330 Carrier of Group B streptococcus: Secondary | ICD-10-CM

## 2012-11-19 DIAGNOSIS — W010XXA Fall on same level from slipping, tripping and stumbling without subsequent striking against object, initial encounter: Secondary | ICD-10-CM | POA: Insufficient documentation

## 2012-11-19 DIAGNOSIS — Y92009 Unspecified place in unspecified non-institutional (private) residence as the place of occurrence of the external cause: Secondary | ICD-10-CM | POA: Insufficient documentation

## 2012-11-19 DIAGNOSIS — Z3493 Encounter for supervision of normal pregnancy, unspecified, third trimester: Secondary | ICD-10-CM

## 2012-11-19 DIAGNOSIS — O479 False labor, unspecified: Secondary | ICD-10-CM | POA: Insufficient documentation

## 2012-11-19 MED ORDER — CYCLOBENZAPRINE HCL 5 MG PO TABS
5.0000 mg | ORAL_TABLET | Freq: Once | ORAL | Status: AC
  Administered 2012-11-19: 5 mg via ORAL
  Filled 2012-11-19: qty 1

## 2012-11-19 MED ORDER — OXYCODONE-ACETAMINOPHEN 5-325 MG PO TABS: 1.0000 | ORAL_TABLET | Freq: Four times a day (QID) | ORAL | Status: DC | PRN

## 2012-11-19 MED ORDER — CYCLOBENZAPRINE HCL 5 MG PO TABS: 5.0000 mg | ORAL_TABLET | Freq: Three times a day (TID) | ORAL | Status: DC | PRN

## 2012-11-19 MED ORDER — OXYCODONE-ACETAMINOPHEN 5-325 MG PO TABS
1.0000 | ORAL_TABLET | Freq: Once | ORAL | Status: AC
  Administered 2012-11-19: 1 via ORAL
  Filled 2012-11-19: qty 1

## 2012-11-19 NOTE — MAU Provider Note (Signed)
  History     CSN: [id number]  Arrival date and time: 11/19/12 2102   None     No chief complaint on file.  HPI Comments: Pt is a G4P3 at [redacted]w[redacted]d that arrived to MAU unannounced after slipping on kitchen floor about 8pm, states she landed into the splits, did not hit abdomen, denies any bruising anywhere, states her pelvis is very "sore" now. Reports irregular ctx that she has been having, no increase in ctx, no abdominal pain or bleeding, no LOF, reports GFM.      Past Medical History  Diagnosis Date  . MRSA infection   . Kidney stones 2009  . Abnormal Pap smear     Colposcopy normal  . Depression     Not on current treatment    Past Surgical History  Procedure Laterality Date  . Colposcopy      Family History  Problem Relation Age of Onset  . COPD Mother   . Hypertension Mother   . Heart disease Mother   . Hyperlipidemia Sister   . Hypertension Sister   . Depression Brother   . Drug abuse Brother   . Cancer Paternal Aunt   . Cancer Paternal Uncle     History  Substance Use Topics  . Smoking status: Former Smoker    Quit date: 09/04/2008  . Smokeless tobacco: Never Used  . Alcohol Use: No    Allergies:  Allergies  Allergen Reactions  . Penicillins Swelling and Rash    Swelling is of the mouth    Prescriptions prior to admission  Medication Sig Dispense Refill  . Prenatal Vit-Fe Fumarate-FA (PRENATAL MULTIVITAMIN) TABS Take 1 tablet by mouth daily at 12 noon.        Review of Systems  Musculoskeletal: Positive for joint pain and falls.       Pelvic pain, groin pain  All other systems reviewed and are negative.   Physical Exam   Blood pressure 113/53, pulse 90, temperature 98 F (36.7 C), resp. rate 18, height 5' 4.5" (1.638 m), weight 235 lb (106.595 kg), last menstrual period 02/22/2012, SpO2 100.00%.  Physical Exam  Nursing note and vitals reviewed. Constitutional: She is oriented to person, place, and time. She appears well-developed and  well-nourished. No distress.  HENT:  Head: Normocephalic.  Cardiovascular: Normal rate.   Respiratory: Effort normal.  GI: Soft. There is no tenderness.  Genitourinary: Vagina normal.  VE 2/25/-3, ext os 3-4cm  Musculoskeletal: Normal range of motion. She exhibits no edema and no tenderness.  Neurological: She is alert and oriented to person, place, and time. She has normal reflexes.  Skin: Skin is warm and dry.  Psychiatric: She has a normal mood and affect. Her behavior is normal.   FHR cat 1 toco irregular UC    MAU Course  Procedures    Assessment and Plan  IUP at [redacted]w[redacted]d S/p fall without hitting abdomen FHR reassuring Not in labor  Flexeril 5mg  PO and 1 tab percocet 5/325mg  PO given here RX for flexeril 5mg  #30 q8h prn and percocet 5/325 #30 1-2tabs q6h prn given Reccommended rest and applying ice several times per day rv'd FKC and labor sx's Keep scheduled f/u at office Call if having abdominal pain or bleeding or dec FM dc'd home in stable condition   Christien Berthelot M 11/19/2012, 10:57 PM

## 2012-11-19 NOTE — MAU Note (Signed)
PT SAYS AT 8 PM - SAYS HER SON WAS MOPPING  FLOOR-  THEN SHE ENTERED ROOM- AND SLIPPED ON WET FLOOR-  DOING A SPLIT -  SHE DOES NOT THINK SHE HIT  HER ABD .   SHE DENIES HITTING HER HEAD.Marland Kitchen   IN OFFICE ON Tuesday- 3-4 CM INTERNAL AND  2 CM INTERNAL.    DENIES HSV AND DOES HAVE HX OF MRSA-    2007- HAD A BOIL  ON HER ABD.

## 2012-11-19 NOTE — MAU Note (Signed)
Walked onto wet floor and did split onto floor. Does not think her abdomen hit anything. Afterward went to BR and passed large amt of clear mucous but has been loosing part of mucous plug for couple weeks. Felt first FM in triage since fall.

## 2012-11-26 ENCOUNTER — Encounter (HOSPITAL_COMMUNITY): Payer: Self-pay

## 2012-11-26 ENCOUNTER — Inpatient Hospital Stay (HOSPITAL_COMMUNITY)
Admission: AD | Admit: 2012-11-26 | Discharge: 2012-11-27 | Disposition: A | Discharge status: Home / Self Care | Payer: Medicaid Other | Source: Ambulatory Visit | Attending: Obstetrics and Gynecology | Admitting: Obstetrics and Gynecology

## 2012-11-26 DIAGNOSIS — O99891 Other specified diseases and conditions complicating pregnancy: Secondary | ICD-10-CM | POA: Insufficient documentation

## 2012-11-26 DIAGNOSIS — Z22330 Carrier of Group B streptococcus: Secondary | ICD-10-CM | POA: Insufficient documentation

## 2012-11-26 DIAGNOSIS — O479 False labor, unspecified: Secondary | ICD-10-CM | POA: Insufficient documentation

## 2012-11-26 NOTE — MAU Note (Signed)
Patient taken off efm per Nigel Bridgeman CNM, patient to ambulate in hallway.

## 2012-11-26 NOTE — MAU Provider Note (Signed)
History   32 yo G4P3003 at 63 5/7 weeks presented after calling with stronger and more frequent contractions today.  Reports hx of rapid labor, with cervix 3 cm, 70%, vtx, -1 at office this week--membranes swept then.  No leaking, small amount spotting tonight.  Patient Active Problem List   Diagnosis Date Noted  . 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 Supervision of normal pregnancy 04/02/2012  . Tinea pedis 11/08/2010  . MOOD SWINGS 09/03/2009  . ABNORMAL PAP SMEAR, LGSIL 08/09/2009  . OBESITY 07/11/2009  . RH FACTOR, NEGATIVE 05/12/2008  . ANEMIA, IRON DEFICIENCY, HX OF 04/07/2007  . DEPRESSIVE DISORDER, NOS 07/23/2006  Hx MRSA in 2009 with "boil" on abdomen--no recurrences.     Chief Complaint  Patient presents with  . Contractions     OB History   Grav Para Term Preterm Abortions TAB SAB Ect Mult Living   4 3 3       3       Past Medical History  Diagnosis Date  . MRSA infection   . Kidney stones 2009  . Abnormal Pap smear     Colposcopy normal  . Depression     Not on current treatment    Past Surgical History  Procedure Laterality Date  . Colposcopy      Family History  Problem Relation Age of Onset  . COPD Mother   . Hypertension Mother   . Heart disease Mother   . Hyperlipidemia Sister   . Hypertension Sister   . Depression Brother   . Drug abuse Brother   . Cancer Paternal Aunt   . Cancer Paternal Uncle     History  Substance Use Topics  . Smoking status: Former Smoker    Quit date: 09/04/2008  . Smokeless tobacco: Never Used  . Alcohol Use: No    Allergies:  Allergies  Allergen Reactions  . Penicillins Swelling and Rash    Swelling is of the mouth    Prescriptions prior to admission  Medication Sig Dispense Refill  . cyclobenzaprine (FLEXERIL) 5 MG tablet Take 1 tablet (5 mg total) by mouth every 8 (eight) hours as needed for muscle spasms.  30 tablet  0  . oxyCODONE-acetaminophen (PERCOCET/ROXICET) 5-325 MG per tablet Take 1-2 tablets by  mouth every 6 (six) hours as needed for pain.  30 tablet  0  . Prenatal Vit-Fe Fumarate-FA (PRENATAL MULTIVITAMIN) TABS Take 1 tablet by mouth daily at 12 noon.         Physical Exam   Blood pressure 129/77, pulse 87, temperature 97 F (36.1 C), temperature source Oral, resp. rate 18, height 5\' 4"  (1.626 m), weight 234 lb 12.8 oz (106.505 kg), last menstrual period 02/22/2012.  Chest clear Heart RRR without murmur Abd gravid, NT Pelvic--3 cm, 70%, vtx, -2, IBOW. Ext WNL  FHR Category 1 UCs q 3-4 min, moderate   ED Course  IUP at 39 5/7 weeks Early vs prodromal labor Positive GBS Hx MRSA  Plan: Observe x 1-2 hours, then recheck.   Nigel Bridgeman CNM, MN 11/26/2012 11:35 PM  Addendum: Returned from walking--no change in UCs per monitor.  Patient still feels they are stronger than they have been. No leaking or bleeding--reports +FM. Cervix unchanged--posterior, firm, 3 cm, 70%, vtx, -2. FHR Category 1 UCs q 5-7 min, mild/moderate.  Plan: D/C home with labor precautions. "Has pain medications at home" from ER visit last week--may try those for comfort. To return with increased contractions, leaking of fluid, decreased FM, or any  other issues. Keep scheduled appt at [facility] or return prn.  Nigel Bridgeman, CNM 11/27/12 1:25a

## 2012-11-26 NOTE — MAU Note (Signed)
Patient presents with contractions every 6 minutes

## 2012-12-02 ENCOUNTER — Telehealth (HOSPITAL_COMMUNITY): Payer: Self-pay | Admitting: *Deleted

## 2012-12-02 NOTE — Telephone Encounter (Signed)
Preadmission screen  

## 2012-12-03 ENCOUNTER — Inpatient Hospital Stay (HOSPITAL_COMMUNITY): Payer: Medicaid Other | Admitting: Anesthesiology

## 2012-12-03 ENCOUNTER — Encounter (HOSPITAL_COMMUNITY): Payer: Self-pay

## 2012-12-03 ENCOUNTER — Encounter (HOSPITAL_COMMUNITY): Payer: Self-pay | Admitting: Anesthesiology

## 2012-12-03 ENCOUNTER — Inpatient Hospital Stay (HOSPITAL_COMMUNITY)
Admission: AD | Admit: 2012-12-03 | Discharge: 2012-12-05 | DRG: 775 | Disposition: A | Discharge status: Home / Self Care | Payer: Medicaid Other | Source: Ambulatory Visit | Attending: Obstetrics and Gynecology | Admitting: Obstetrics and Gynecology

## 2012-12-03 DIAGNOSIS — Z880 Allergy status to penicillin: Secondary | ICD-10-CM

## 2012-12-03 DIAGNOSIS — Z22330 Carrier of Group B streptococcus: Secondary | ICD-10-CM

## 2012-12-03 DIAGNOSIS — Z3493 Encounter for supervision of normal pregnancy, unspecified, third trimester: Secondary | ICD-10-CM

## 2012-12-03 DIAGNOSIS — O99892 Other specified diseases and conditions complicating childbirth: Principal | ICD-10-CM | POA: Diagnosis present

## 2012-12-03 DIAGNOSIS — IMO0001 Reserved for inherently not codable concepts without codable children: Secondary | ICD-10-CM

## 2012-12-03 LAB — TYPE AND SCREEN: ABO/RH(D): A NEG

## 2012-12-03 LAB — MRSA PCR SCREENING: MRSA by PCR: NEGATIVE

## 2012-12-03 LAB — RPR: RPR Ser Ql: NONREACTIVE

## 2012-12-03 LAB — CBC
HCT: 37.1 % (ref 36.0–46.0)
Hemoglobin: 12.5 g/dL (ref 12.0–15.0)
RBC: 4.25 MIL/uL (ref 3.87–5.11)
WBC: 10.7 10*3/uL — ABNORMAL HIGH (ref 4.0–10.5)

## 2012-12-03 MED ORDER — PHENYLEPHRINE 40 MCG/ML (10ML) SYRINGE FOR IV PUSH (FOR BLOOD PRESSURE SUPPORT)
80.0000 ug | PREFILLED_SYRINGE | INTRAVENOUS | Status: DC | PRN
  Filled 2012-12-03: qty 5

## 2012-12-03 MED ORDER — OXYCODONE-ACETAMINOPHEN 5-325 MG PO TABS
1.0000 | ORAL_TABLET | ORAL | Status: DC | PRN
  Administered 2012-12-03: 1 via ORAL
  Administered 2012-12-04: 2 via ORAL
  Administered 2012-12-04 (×2): 1 via ORAL
  Administered 2012-12-05 (×2): 2 via ORAL
  Filled 2012-12-03: qty 1
  Filled 2012-12-03 (×3): qty 2
  Filled 2012-12-03 (×2): qty 1

## 2012-12-03 MED ORDER — DIPHENHYDRAMINE HCL 50 MG/ML IJ SOLN: 12.5000 mg | INTRAMUSCULAR | Status: DC | PRN

## 2012-12-03 MED ORDER — LIDOCAINE HCL (PF) 1 % IJ SOLN
30.0000 mL | INTRAMUSCULAR | Status: DC | PRN
  Filled 2012-12-03: qty 30

## 2012-12-03 MED ORDER — BENZOCAINE-MENTHOL 20-0.5 % EX AERO: 1.0000 "application " | INHALATION_SPRAY | CUTANEOUS | Status: DC | PRN

## 2012-12-03 MED ORDER — IBUPROFEN 600 MG PO TABS
600.0000 mg | ORAL_TABLET | Freq: Four times a day (QID) | ORAL | Status: DC
  Administered 2012-12-04 – 2012-12-05 (×7): 600 mg via ORAL
  Filled 2012-12-03 (×7): qty 1

## 2012-12-03 MED ORDER — BUTORPHANOL TARTRATE 1 MG/ML IJ SOLN: 1.0000 mg | INTRAMUSCULAR | Status: DC | PRN

## 2012-12-03 MED ORDER — CEFAZOLIN SODIUM-DEXTROSE 2-3 GM-% IV SOLR
2.0000 g | Freq: Once | INTRAVENOUS | Status: AC
  Administered 2012-12-03: 2 g via INTRAVENOUS
  Filled 2012-12-03: qty 50

## 2012-12-03 MED ORDER — SENNOSIDES-DOCUSATE SODIUM 8.6-50 MG PO TABS
2.0000 | ORAL_TABLET | Freq: Every day | ORAL | Status: DC
  Administered 2012-12-03 – 2012-12-04 (×2): 2 via ORAL

## 2012-12-03 MED ORDER — OXYCODONE-ACETAMINOPHEN 5-325 MG PO TABS: 1.0000 | ORAL_TABLET | ORAL | Status: DC | PRN

## 2012-12-03 MED ORDER — EPHEDRINE 5 MG/ML INJ
10.0000 mg | INTRAVENOUS | Status: DC | PRN
  Filled 2012-12-03: qty 4

## 2012-12-03 MED ORDER — LACTATED RINGERS IV SOLN
INTRAVENOUS | Status: DC
  Administered 2012-12-03 (×2): via INTRAVENOUS

## 2012-12-03 MED ORDER — TETANUS-DIPHTH-ACELL PERTUSSIS 5-2.5-18.5 LF-MCG/0.5 IM SUSP
0.5000 mL | Freq: Once | INTRAMUSCULAR | Status: AC
  Administered 2012-12-04: 0.5 mL via INTRAMUSCULAR
  Filled 2012-12-03: qty 0.5

## 2012-12-03 MED ORDER — ONDANSETRON HCL 4 MG/2ML IJ SOLN: 4.0000 mg | Freq: Four times a day (QID) | INTRAMUSCULAR | Status: DC | PRN

## 2012-12-03 MED ORDER — PRENATAL MULTIVITAMIN CH
1.0000 | ORAL_TABLET | Freq: Every day | ORAL | Status: DC
  Administered 2012-12-04 – 2012-12-05 (×2): 1 via ORAL
  Filled 2012-12-03 (×2): qty 1

## 2012-12-03 MED ORDER — ONDANSETRON HCL 4 MG PO TABS: 4.0000 mg | ORAL_TABLET | ORAL | Status: DC | PRN

## 2012-12-03 MED ORDER — ONDANSETRON HCL 4 MG/2ML IJ SOLN: 4.0000 mg | INTRAMUSCULAR | Status: DC | PRN

## 2012-12-03 MED ORDER — IBUPROFEN 600 MG PO TABS
600.0000 mg | ORAL_TABLET | Freq: Four times a day (QID) | ORAL | Status: DC | PRN
  Administered 2012-12-03: 600 mg via ORAL
  Filled 2012-12-03: qty 1

## 2012-12-03 MED ORDER — PHENYLEPHRINE 40 MCG/ML (10ML) SYRINGE FOR IV PUSH (FOR BLOOD PRESSURE SUPPORT)
80.0000 ug | PREFILLED_SYRINGE | INTRAVENOUS | Status: DC | PRN
  Administered 2012-12-03: 80 ug via INTRAVENOUS

## 2012-12-03 MED ORDER — OXYTOCIN 40 UNITS IN LACTATED RINGERS INFUSION - SIMPLE MED
62.5000 mL/h | INTRAVENOUS | Status: DC
  Filled 2012-12-03: qty 1000

## 2012-12-03 MED ORDER — LANOLIN HYDROUS EX OINT: TOPICAL_OINTMENT | CUTANEOUS | Status: DC | PRN

## 2012-12-03 MED ORDER — LIDOCAINE HCL (PF) 1 % IJ SOLN
INTRAMUSCULAR | Status: DC | PRN
  Administered 2012-12-03 (×4): 4 mL

## 2012-12-03 MED ORDER — EPHEDRINE 5 MG/ML INJ: 10.0000 mg | INTRAVENOUS | Status: DC | PRN

## 2012-12-03 MED ORDER — CITRIC ACID-SODIUM CITRATE 334-500 MG/5ML PO SOLN: 30.0000 mL | ORAL | Status: DC | PRN

## 2012-12-03 MED ORDER — FENTANYL 2.5 MCG/ML BUPIVACAINE 1/10 % EPIDURAL INFUSION (WH - ANES)
14.0000 mL/h | INTRAMUSCULAR | Status: DC | PRN
  Administered 2012-12-03: 14 mL/h via EPIDURAL
  Filled 2012-12-03: qty 125

## 2012-12-03 MED ORDER — WITCH HAZEL-GLYCERIN EX PADS: 1.0000 "application " | MEDICATED_PAD | CUTANEOUS | Status: DC | PRN

## 2012-12-03 MED ORDER — LACTATED RINGERS IV SOLN
500.0000 mL | INTRAVENOUS | Status: DC | PRN
  Administered 2012-12-03: 300 mL via INTRAVENOUS

## 2012-12-03 MED ORDER — DIBUCAINE 1 % RE OINT: 1.0000 "application " | TOPICAL_OINTMENT | RECTAL | Status: DC | PRN

## 2012-12-03 MED ORDER — ZOLPIDEM TARTRATE 5 MG PO TABS: 5.0000 mg | ORAL_TABLET | Freq: Every evening | ORAL | Status: DC | PRN

## 2012-12-03 MED ORDER — ACETAMINOPHEN 325 MG PO TABS: 650.0000 mg | ORAL_TABLET | ORAL | Status: DC | PRN

## 2012-12-03 MED ORDER — CEFAZOLIN SODIUM 1-5 GM-% IV SOLN
1.0000 g | Freq: Three times a day (TID) | INTRAVENOUS | Status: DC
  Administered 2012-12-03: 1 g via INTRAVENOUS
  Filled 2012-12-03 (×2): qty 50

## 2012-12-03 MED ORDER — OXYTOCIN BOLUS FROM INFUSION
500.0000 mL | INTRAVENOUS | Status: DC
  Administered 2012-12-03: 500 mL via INTRAVENOUS

## 2012-12-03 MED ORDER — SIMETHICONE 80 MG PO CHEW: 80.0000 mg | CHEWABLE_TABLET | ORAL | Status: DC | PRN

## 2012-12-03 MED ORDER — FLEET ENEMA 7-19 GM/118ML RE ENEM: 1.0000 | ENEMA | RECTAL | Status: DC | PRN

## 2012-12-03 MED ORDER — DIPHENHYDRAMINE HCL 25 MG PO CAPS: 25.0000 mg | ORAL_CAPSULE | Freq: Four times a day (QID) | ORAL | Status: DC | PRN

## 2012-12-03 MED ORDER — LACTATED RINGERS IV SOLN
500.0000 mL | Freq: Once | INTRAVENOUS | Status: AC
  Administered 2012-12-03: 500 mL via INTRAVENOUS

## 2012-12-03 NOTE — Anesthesia Procedure Notes (Signed)
Epidural Patient location during procedure: OB Start time: 12/03/2012 9:11 AM  Staffing Performed by: anesthesiologist   Preanesthetic Checklist Completed: patient identified, site marked, surgical consent, pre-op evaluation, timeout performed, IV checked, risks and benefits discussed and monitors and equipment checked  Epidural Patient position: sitting Prep: site prepped and draped and DuraPrep Patient monitoring: continuous pulse ox and blood pressure Approach: midline Injection technique: LOR air  Needle:  Needle type: Tuohy  Needle gauge: 17 G Needle length: 9 cm and 9 Needle insertion depth: 6 cm Catheter type: closed end flexible Catheter size: 19 Gauge Catheter at skin depth: 11 cm Test dose: negative  Assessment Events: blood not aspirated, injection not painful, no injection resistance, negative IV test and no paresthesia  Additional Notes Discussed risk of headache, infection, bleeding, nerve injury and failed or incomplete block.  Patient voices understanding and wishes to proceed.  Epidural placed easily on first attempt.  No paresthesia.  Patient tolerated procedure well with no apparent complications.  Jasmine December, MD Reason for block:procedure for pain

## 2012-12-03 NOTE — Progress Notes (Signed)
  Subjective: Just received epidural.  1st dose of ATB has infused--no reaction noted.  Objective: BP 125/74  Pulse 77  Temp(Src) 97.8 F (36.6 C) (Oral)  Resp 20  Ht 5\' 4"  (1.626 m)  Wt 235 lb (106.595 kg)  BMI 40.32 kg/m2  SpO2 98%  LMP 02/22/2012      FHT:  Category 1 Contractions q 3 min, moderate VE deferred at present  Assessment / Plan: Active labor GBS + Will recheck cervix in approx 1 hour--AROM as appropriate.  Nigel Bridgeman 12/03/2012, 9:44 AM

## 2012-12-03 NOTE — MAU Note (Signed)
Pt states uc began around 0230 and have progressively gotten stronger since. Denies vaginal bleeding and LOF. States good FM.

## 2012-12-03 NOTE — H&P (Signed)
Katherine Fritz is a 32 y.o. female, [phone] at 66 5/7 weeks, presenting with contractions of increasing intensity and frequency since last night.  Denies leaking or bleeding, reports +FM.  Cervix was 3 cm on last exam  History of present pregnancy: Patient entered care at 27 weeks in [facility].   EDC of 11/28/12 was established by LMP and verified by 1st trimester screen.   Anatomy scan:  18 weeks at [facility], with normal findings and an anterior placenta.   Additional [place] evaluations:  39 weeks at [facility], with EFW 8+13, and normal fluid..   Significant prenatal events:  Several evaluations recently for prodromal labor.  +GBS by urine culture at [facility] at clinic, but no sensitivities done.  Was Rx'd Keflex po in April for +urine culture, with patient tolerating without rxn. Last evaluation:  11/30/12, cervix 3 cm, 60%, vtx . History OB History   Grav Para Term Preterm Abortions TAB SAB Ect Mult Living   4 3 3       3     98--SVB 2000--SVB 2010--SVB  Past Medical History  Diagnosis Date  . MRSA infection   . Kidney stones 2009  . Abnormal Pap smear     Colposcopy normal  . Depression     Not on current treatment   Past Surgical History  Procedure Laterality Date  . Colposcopy     Family History: family history includes COPD in her mother; Cancer in her paternal aunt and paternal uncle; Depression in her brother; Drug abuse in her brother; Heart disease in her mother; Hyperlipidemia in her sister; and Hypertension in her mother and sister.  Social History:  reports that she quit smoking about 4 years ago. She has never used smokeless tobacco. She reports that she does not drink alcohol or use illicit drugs.  Husband, Damaya Channing, is present and supportive.   Prenatal Transfer Tool  Maternal Diabetes: No Genetic Screening: Normal Maternal Ultrasounds/Referrals: Normal Fetal Ultrasounds or other Referrals:  None Maternal Substance  Abuse:  Yes:  Type: Smoker Significant Maternal Medications:  None Significant Maternal Lab Results:  Lab values include: Group B Strep positive Other Comments: MRSA + in 2006.  ROS:  Contractions, +FM  Dilation: 4.5 Effacement (%): 70 Station: -2 Exam by:: VEmilee Hero CNM Blood pressure 112/78, pulse 84, resp. rate 18, height 5\' 4"  (1.626 m), weight 235 lb (106.595 kg), last menstrual period 02/22/2012, SpO2 100.00%. Exam Physical Exam  Chest clear Heart RRR without murmur Abd gravid, NT Pelvic--as above Ext WNL  FHR Category 1 UCs q 2-3 min, moderate  Prenatal labs: ABO, Rh: --/--/A NEG (12/04 1831) Antibody: NEG (12/04 1831) Rubella: 1.12 (12/04 1534) RPR: NON REAC (12/04 1534)  HBsAg: NEGATIVE (12/04 1534)  HIV: NON REACTIVE (12/04 1534)  GBS: Positive (11/08 0000) --per urine culture at [facility] visit at [facility], no sensitivities done. Glucola WNL Hgb WNL at [facility] and at 28 weeks Hx +MRSA  Assessment/Plan: IUP at 40 5/7 weeks Active labor + GBS, with no sensitivities done PCN allergic as childhood hx, but has taken Keflex this pregnancy with no issues. Hx MRSA 2006 Rh negative Desires epidural  Plan: Admit to Birthing Suite per consult with Dr. Estanislado Pandy Routine [facility] orders Plan GBS Rx with Ancef Contact precautions per Infection Prevention guidelines Epidural prn.   Nigel Bridgeman 12/03/2012, 7:39 AM

## 2012-12-03 NOTE — Progress Notes (Signed)
  Subjective: Comfortable with epidural.  Objective: BP 119/59  Pulse 72  Temp(Src) 97.8 F (36.6 C) (Oral)  Resp 18  Ht 5\' 4"  (1.626 m)  Wt 235 lb (106.595 kg)  BMI 40.32 kg/m2  SpO2 98%  LMP 02/22/2012      FHT:  Category 1 UC:   q 3-4 min SVE:   Dilation: 4.5 Effacement (%): 70 Station: -2 Exam by:: Emilee Hero, CNM AROM--moderate MSF noted. Vtx better applied to cervix after AROM.  Assessment / Plan: Progressive labor Will CTO--augment prn.  Nigel Bridgeman 12/03/2012, 10:36 AM

## 2012-12-03 NOTE — Progress Notes (Signed)
  Subjective: Feeling some pressure, but no urge to push.  Objective: BP 106/71  Pulse 66  Temp(Src) 97.9 F (36.6 C) (Oral)  Resp 18  Ht 5\' 4"  (1.626 m)  Wt 235 lb (106.595 kg)  BMI 40.32 kg/m2  SpO2 98%  LMP 02/22/2012      FHT:  Category 1 UC:   q 3-4 min SVE:   9.5 cm, with slight rim on right side of cx, 100%, vtx, 0 station.  Assessment / Plan: Progressive labor Await urge to push  Katherine Fritz 12/03/2012, 2:25 PM

## 2012-12-03 NOTE — Anesthesia Preprocedure Evaluation (Signed)
Anesthesia Evaluation  Patient identified by MRN, date of birth, ID band Patient awake    Reviewed: Allergy & Precautions, H&P , Patient's Chart, lab work & pertinent test results  Airway Mallampati: II TM Distance: >3 FB Neck ROM: full    Dental no notable dental hx.    Pulmonary neg pulmonary ROS,  breath sounds clear to auscultation  Pulmonary exam normal       Cardiovascular negative cardio ROS  Rhythm:regular Rate:Normal     Neuro/Psych PSYCHIATRIC DISORDERS Depression negative neurological ROS  negative psych ROS   GI/Hepatic negative GI ROS, Neg liver ROS,   Endo/Other  negative endocrine ROSMorbid obesity  Renal/GU negative Renal ROS     Musculoskeletal   Abdominal   Peds  Hematology negative hematology ROS (+)   Anesthesia Other Findings   Reproductive/Obstetrics (+) Pregnancy                           Anesthesia Physical Anesthesia Plan  ASA: III  Anesthesia Plan: Epidural   Post-op Pain Management:    Induction:   Airway Management Planned:   Additional Equipment:   Intra-op Plan:   Post-operative Plan:   Informed Consent: I have reviewed the patients History and Physical, chart, labs and discussed the procedure including the risks, benefits and alternatives for the proposed anesthesia with the patient or authorized representative who has indicated his/her understanding and acceptance.     Plan Discussed with:   Anesthesia Plan Comments:         Anesthesia Quick Evaluation

## 2012-12-04 LAB — CBC
HCT: 31.7 % — ABNORMAL LOW (ref 36.0–46.0)
Hemoglobin: 10.5 g/dL — ABNORMAL LOW (ref 12.0–15.0)
MCH: 28.8 pg (ref 26.0–34.0)
MCHC: 33.1 g/dL (ref 30.0–36.0)
RDW: 14.8 % (ref 11.5–15.5)

## 2012-12-04 MED ORDER — RHO D IMMUNE GLOBULIN 1500 UNIT/2ML IJ SOLN
300.0000 ug | Freq: Once | INTRAMUSCULAR | Status: AC
  Administered 2012-12-04: 300 ug via INTRAMUSCULAR
  Filled 2012-12-04: qty 2

## 2012-12-04 NOTE — Progress Notes (Signed)
Post Partum Day 1 Subjective: Reports feeling well.  Ambulating, voiding and tol po liquids and solids without difficulty.  Working on breastfeeding.  Pos flatus, neg BM.  Minimal perineal pain relieved with medications.   Objective: Blood pressure 107/64, pulse 85, temperature 97.5 F (36.4 C), temperature source Oral, resp. rate 16, height 5\' 4"  (1.626 m), weight 106.595 kg (235 lb), last menstrual period 02/22/2012, SpO2 98.00%, unknown if currently breastfeeding.  Physical Exam:  General: alert, cooperative and no distress Heart:  RRR Lungs:  CTA bilat Abd:  Soft, NT with pos BS x 4 quads Lochia: appropriate, sm rubra Uterine Fundus: firm, NT @ umb. Incision: Perineum intact DVT Evaluation: No evidence of DVT seen on physical exam. Negative Homan's sign bilat. No significant calf/ankle edema.   Recent Labs  12/03/12 0735 12/04/12 0630  HGB 12.5 10.5*  HCT 37.1 31.7*    Assessment/Plan: Stable s/p vag del  Anticipate discharge tomorrow.   Continue current plan of care at present.   LOS: 1 day   Deneise Getty O. 12/04/2012, 8:49 AM

## 2012-12-04 NOTE — Anesthesia Postprocedure Evaluation (Signed)
Anesthesia Post Note  Patient: Katherine Fritz  Procedure(s) Performed: * No procedures listed *  Anesthesia type: Epidural  Patient location: Mother/Baby  Post pain: Pain level controlled  Post assessment: Post-op Vital signs reviewed  Last Vitals:  Filed Vitals:   12/04/12 0557  BP: 107/64  Pulse: 85  Temp: 36.4 C  Resp: 16    Post vital signs: Reviewed  Level of consciousness: awake  Complications: No apparent anesthesia complications

## 2012-12-05 ENCOUNTER — Inpatient Hospital Stay (HOSPITAL_COMMUNITY): Admission: RE | Admit: 2012-12-05 | Payer: Medicaid Other | Source: Ambulatory Visit

## 2012-12-05 LAB — RH IG WORKUP (INCLUDES ABO/RH)
Gestational Age(Wks): 40.5
Unit division: 0

## 2012-12-05 MED ORDER — OXYCODONE-ACETAMINOPHEN 5-325 MG PO TABS: 1.0000 | ORAL_TABLET | Freq: Four times a day (QID) | ORAL | Status: DC | PRN

## 2012-12-05 MED ORDER — IBUPROFEN 600 MG PO TABS: 600.0000 mg | ORAL_TABLET | Freq: Four times a day (QID) | ORAL | Status: DC | PRN

## 2012-12-05 NOTE — Discharge Summary (Signed)
Obstetric Discharge Summary Reason for Admission: onset of labor Prenatal Procedures: ultrasound Intrapartum Procedures: spontaneous vaginal delivery Postpartum Procedures: none Complications-Operative and Postpartum: none Hemoglobin  Date Value Range Status  12/04/2012 10.5* 12.0 - 15.0 g/dL Final     HCT  Date Value Range Status  12/04/2012 31.7* 36.0 - 46.0 % Final    Physical Exam:  General: alert and no distress Lochia: appropriate Uterine Fundus: firm Incision: n/a DVT Evaluation: No evidence of DVT seen on physical exam.  Discharge Diagnoses: Term Pregnancy-delivered  Discharge Information: Date: 12/05/2012 Activity: pelvic rest Diet: routine Medications: Ibuprofen and Percocet Condition: stable Instructions: refer to practice specific booklet Discharge to: home   Newborn Data: Live born female  Birth Weight: 9 lb 8.9 oz (4335 g) APGAR: 9, 9  Home with mother.  Purcell Nails 12/05/2012, 1:37 PM

## 2012-12-06 NOTE — Progress Notes (Signed)
Post discharge chart review completed.  

## 2012-12-10 ENCOUNTER — Other Ambulatory Visit (HOSPITAL_COMMUNITY): Payer: Self-pay | Admitting: Obstetrics and Gynecology

## 2012-12-10 ENCOUNTER — Ambulatory Visit (HOSPITAL_COMMUNITY)
Admission: RE | Admit: 2012-12-10 | Discharge: 2012-12-10 | Disposition: A | Discharge status: Home / Self Care | Payer: Medicaid Other | Source: Ambulatory Visit | Attending: Obstetrics and Gynecology | Admitting: Obstetrics and Gynecology

## 2012-12-10 DIAGNOSIS — M79609 Pain in unspecified limb: Secondary | ICD-10-CM | POA: Insufficient documentation

## 2012-12-10 DIAGNOSIS — E669 Obesity, unspecified: Secondary | ICD-10-CM

## 2012-12-10 NOTE — Progress Notes (Signed)
Left lower extremity venous duplex completed.  Left:  No evidence of DVT, superficial thrombosis, or Baker's cyst.  Right:  Negative for DVT in the common femoral vein.  

## 2013-03-14 ENCOUNTER — Ambulatory Visit (INDEPENDENT_AMBULATORY_CARE_PROVIDER_SITE_OTHER): Payer: Medicaid Other | Admitting: Family Medicine

## 2013-03-14 ENCOUNTER — Encounter: Payer: Self-pay | Admitting: Family Medicine

## 2013-03-14 VITALS — BP 116/80 | HR 84 | Ht 64.0 in | Wt 214.0 lb

## 2013-03-14 DIAGNOSIS — Z3009 Encounter for other general counseling and advice on contraception: Secondary | ICD-10-CM

## 2013-03-14 DIAGNOSIS — Z23 Encounter for immunization: Secondary | ICD-10-CM

## 2013-03-14 NOTE — Progress Notes (Signed)
  Subjective:    Patient ID: Katherine Fritz, female    DOB: 1981-03-21, 32 y.o.   MRN: [id number]  HPI  32 year old female who presents today for contraceptive counseling. She is approximately 3 months status post spontaneous vaginal delivery of her fourth child. A Mirena IUD was placed approximately 8 weeks after her delivery. It was placed by a physician's assistant  At [facility]. Since that time, the patient notes that she has had persistent bleeding up until this weekend at which time it stopped. She states that she was not instructed her counselor how long she may bleed after this IUD was inserted. She also has questions about the mechanism of action for preventing pregnancy.   Review of Systems     Objective:   Physical Exam  BP 116/80  Pulse 84  Ht 5\' 4"  (1.626 m)  Wt 214 lb (97.07 kg)  BMI 36.72 kg/m2 Gen.: well-appearing female, no distress, obese      Assessment & Plan:  Patient counseled for approximately 15 minutes.

## 2013-03-14 NOTE — Patient Instructions (Signed)
Dear Mrs. Robel,   Thank you for coming to clinic today. Please read below regarding the issues that we discussed.   Contraception - It is very normal to bleed for several months after starting the Mirena, so I think that you are really doing well with your bleeding. Please continue with this for several months before thinking about other options.   Please follow up in clinic in 3 months. Please call earlier if you have any questions or concerns.   Sincerely,   Dr. Clinton Sawyer

## 2013-03-14 NOTE — Assessment & Plan Note (Signed)
Patient counseled on the mechanism of action of the IUD and expected side effects of the medication. We also discussed other contraceptive methods specifically Nexplanon. After discussion, patient recognized that the side effects of the Mirena IUD are minimal at this point and she would like to continue this method of contraception. I encouraged her follow up in 3 months to readdress topic if she like.

## 2013-06-11 ENCOUNTER — Emergency Department (HOSPITAL_BASED_OUTPATIENT_CLINIC_OR_DEPARTMENT_OTHER): Payer: Self-pay

## 2013-06-11 ENCOUNTER — Encounter (HOSPITAL_BASED_OUTPATIENT_CLINIC_OR_DEPARTMENT_OTHER): Payer: Self-pay | Admitting: Emergency Medicine

## 2013-06-11 ENCOUNTER — Emergency Department (HOSPITAL_BASED_OUTPATIENT_CLINIC_OR_DEPARTMENT_OTHER)
Admission: EM | Admit: 2013-06-11 | Discharge: 2013-06-12 | Disposition: A | Discharge status: Home / Self Care | Payer: Self-pay | Attending: Emergency Medicine | Admitting: Emergency Medicine

## 2013-06-11 DIAGNOSIS — Z87891 Personal history of nicotine dependence: Secondary | ICD-10-CM | POA: Insufficient documentation

## 2013-06-11 DIAGNOSIS — R1013 Epigastric pain: Secondary | ICD-10-CM | POA: Insufficient documentation

## 2013-06-11 DIAGNOSIS — M546 Pain in thoracic spine: Secondary | ICD-10-CM | POA: Insufficient documentation

## 2013-06-11 DIAGNOSIS — Z8659 Personal history of other mental and behavioral disorders: Secondary | ICD-10-CM | POA: Insufficient documentation

## 2013-06-11 DIAGNOSIS — Z87442 Personal history of urinary calculi: Secondary | ICD-10-CM | POA: Insufficient documentation

## 2013-06-11 DIAGNOSIS — M549 Dorsalgia, unspecified: Secondary | ICD-10-CM

## 2013-06-11 DIAGNOSIS — Z3202 Encounter for pregnancy test, result negative: Secondary | ICD-10-CM | POA: Insufficient documentation

## 2013-06-11 DIAGNOSIS — R1011 Right upper quadrant pain: Secondary | ICD-10-CM | POA: Insufficient documentation

## 2013-06-11 DIAGNOSIS — Z8614 Personal history of Methicillin resistant Staphylococcus aureus infection: Secondary | ICD-10-CM | POA: Insufficient documentation

## 2013-06-11 DIAGNOSIS — Z880 Allergy status to penicillin: Secondary | ICD-10-CM | POA: Insufficient documentation

## 2013-06-11 LAB — CBC WITH DIFFERENTIAL/PLATELET
BASOS ABS: 0 10*3/uL (ref 0.0–0.1)
Basophils Relative: 0 % (ref 0–1)
Eosinophils Absolute: 0.1 10*3/uL (ref 0.0–0.7)
Eosinophils Relative: 1 % (ref 0–5)
HCT: 38.9 % (ref 36.0–46.0)
Hemoglobin: 12.6 g/dL (ref 12.0–15.0)
LYMPHS ABS: 2 10*3/uL (ref 0.7–4.0)
LYMPHS PCT: 19 % (ref 12–46)
MCH: 28.8 pg (ref 26.0–34.0)
MCHC: 32.4 g/dL (ref 30.0–36.0)
MCV: 89 fL (ref 78.0–100.0)
Monocytes Absolute: 0.8 10*3/uL (ref 0.1–1.0)
Monocytes Relative: 8 % (ref 3–12)
NEUTROS PCT: 72 % (ref 43–77)
Neutro Abs: 7.5 10*3/uL (ref 1.7–7.7)
PLATELETS: 344 10*3/uL (ref 150–400)
RBC: 4.37 MIL/uL (ref 3.87–5.11)
RDW: 13.4 % (ref 11.5–15.5)
WBC: 10.4 10*3/uL (ref 4.0–10.5)

## 2013-06-11 LAB — COMPREHENSIVE METABOLIC PANEL
ALK PHOS: 88 U/L (ref 39–117)
ALT: 12 U/L (ref 0–35)
AST: 13 U/L (ref 0–37)
Albumin: 3.9 g/dL (ref 3.5–5.2)
BUN: 15 mg/dL (ref 6–23)
CO2: 25 meq/L (ref 19–32)
Calcium: 9.4 mg/dL (ref 8.4–10.5)
Chloride: 101 mEq/L (ref 96–112)
Creatinine, Ser: 0.8 mg/dL (ref 0.50–1.10)
GFR calc non Af Amer: >90 mL/min (ref >90)
GLUCOSE: 110 mg/dL — AB (ref 70–99)
POTASSIUM: 4.6 meq/L (ref 3.7–5.3)
SODIUM: 139 meq/L (ref 137–147)
TOTAL PROTEIN: 8.1 g/dL (ref 6.0–8.3)
Total Bilirubin: 0.3 mg/dL (ref 0.3–1.2)

## 2013-06-11 LAB — URINALYSIS, ROUTINE W REFLEX MICROSCOPIC
BILIRUBIN URINE: NEGATIVE
GLUCOSE, UA: NEGATIVE mg/dL
HGB URINE DIPSTICK: NEGATIVE
Ketones, ur: NEGATIVE mg/dL
Leukocytes, UA: NEGATIVE
Nitrite: NEGATIVE
Protein, ur: NEGATIVE mg/dL
SPECIFIC GRAVITY, URINE: 1.023 (ref 1.005–1.030)
UROBILINOGEN UA: 0.2 mg/dL (ref 0.0–1.0)
pH: 5.5 (ref 5.0–8.0)

## 2013-06-11 LAB — LIPASE, BLOOD: Lipase: 38 U/L (ref 11–59)

## 2013-06-11 LAB — PREGNANCY, URINE: PREG TEST UR: NEGATIVE

## 2013-06-11 LAB — D-DIMER, QUANTITATIVE (NOT AT ARMC): D DIMER QUANT: 0.57 ug{FEU}/mL — AB (ref 0.00–0.48)

## 2013-06-11 LAB — TROPONIN I: Troponin I: <0.3 ng/mL (ref <0.30)

## 2013-06-11 MED ORDER — ONDANSETRON HCL 4 MG/2ML IJ SOLN
4.0000 mg | Freq: Once | INTRAMUSCULAR | Status: AC
  Administered 2013-06-11: 4 mg via INTRAVENOUS
  Filled 2013-06-11: qty 2

## 2013-06-11 MED ORDER — IOHEXOL 350 MG/ML SOLN
100.0000 mL | Freq: Once | INTRAVENOUS | Status: AC | PRN
  Administered 2013-06-11: 100 mL via INTRAVENOUS

## 2013-06-11 MED ORDER — MORPHINE SULFATE 4 MG/ML IJ SOLN
4.0000 mg | Freq: Once | INTRAMUSCULAR | Status: AC
  Administered 2013-06-11: 4 mg via INTRAVENOUS
  Filled 2013-06-11: qty 1

## 2013-06-11 MED ORDER — IOHEXOL 300 MG/ML  SOLN
50.0000 mL | Freq: Once | INTRAMUSCULAR | Status: AC | PRN
  Administered 2013-06-11: 50 mL via ORAL

## 2013-06-11 NOTE — ED Notes (Signed)
Pt reports she woke up with back pain last night that went through to chest- used heating pad, ibuprofen, and flexeril with some relief. Pain returned today at 6pm, starts in back and goes through to chest. Last meal was pizza at 1200

## 2013-06-11 NOTE — ED Provider Notes (Signed)
CSN: [id number]     Arrival date & time 06/11/13  1959 History  This chart was scribed for Ezequiel Essex, MD by Maree Erie, ED Scribe. The patient was seen in room MH11/MH11. Patient's care was started at 9:06 PM.     Chief Complaint  Patient presents with  . Chest Pain    Patient is a 33 y.o. female presenting with chest pain. The history is provided by the patient. No language interpreter was used.  Chest Pain   HPI Comments: Katherine Fritz is a 33 y.o. female who presents to the Emergency Department complaining of constant, mid shoulder blade back pain that began three hours ago. She states the pain radiates through to her chest. She denies any acute injury or twisting to the area to cause the pain. She had a similar episode of pain last night that subsided after she fell asleep. She denies any alleviating factors. She also reportsabdominal pain that occurred during the exam upon palpation. She denies noticing the pain before coming to the ED. She denies cough or fever. She has a past medical history of recurrent kidney stones. She denies a history of heart or lung problems. She denies any abdominal surgeries. She denies taking any medication on a daily basis. She has a Mirena IUD for    Past Medical History  Diagnosis Date  . MRSA infection   . Kidney stones 2009  . Abnormal Pap smear     Colposcopy normal  . Depression     Not on current treatment   Past Surgical History  Procedure Laterality Date  . Colposcopy     Family History  Problem Relation Age of Onset  . COPD Mother   . Hypertension Mother   . Heart disease Mother   . Hyperlipidemia Sister   . Hypertension Sister   . Depression Brother   . Drug abuse Brother   . Cancer Paternal Aunt   . Cancer Paternal Uncle    History  Substance Use Topics  . Smoking status: Former Smoker -- 1.00 packs/day    Types: Cigarettes    Quit date: 09/04/2008  . Smokeless tobacco: Never Used  . Alcohol Use: No   OB  History   Grav Para Term Preterm Abortions TAB SAB Ect Mult Living   4 4 4       4      Review of Systems A complete 10 system review of systems was obtained and all systems are negative except as noted in the HPI and PMH.    Allergies  Penicillins  Home Medications   Current Outpatient Rx  Name  Route  Sig  Dispense  Refill  . cyclobenzaprine (FLEXERIL) 10 MG tablet   Oral   Take 10 mg by mouth 3 (three) times daily as needed for muscle spasms.         Marland Kitchen HYDROcodone-acetaminophen (NORCO/VICODIN) 5-325 MG per tablet   Oral   Take 2 tablets by mouth every 4 (four) hours as needed.   10 tablet   0   . ibuprofen (ADVIL,MOTRIN) 600 MG tablet   Oral   Take 1 tablet (600 mg total) by mouth every 6 (six) hours as needed for pain.   30 tablet   1   . ondansetron (ZOFRAN) 4 MG tablet   Oral   Take 1 tablet (4 mg total) by mouth every 6 (six) hours.   12 tablet   0   . oxyCODONE-acetaminophen (PERCOCET/ROXICET) 5-325 MG per tablet  Oral   Take 1-2 tablets by mouth every 6 (six) hours as needed for pain.   30 tablet   0   . Prenatal Vit-Fe Fumarate-FA (PRENATAL MULTIVITAMIN) TABS   Oral   Take 1 tablet by mouth daily at 12 noon.          Triage Vitals: BP 133/94  Pulse 68  Temp(Src) 97.8 F (36.6 C) (Oral)  Resp 20  Ht 5\' 4"  (1.626 m)  Wt 219 lb (99.338 kg)  BMI 37.57 kg/m2  SpO2 100%  Breastfeeding? No  Physical Exam  Nursing note and vitals reviewed. Constitutional: She is oriented to person, place, and time. She appears well-developed and well-nourished.  Uncomfortable.   HENT:  Head: Normocephalic and atraumatic.  Eyes: EOM are normal.  Neck: Neck supple. No tracheal deviation present.  Cardiovascular: Normal rate, regular rhythm and normal heart sounds.   No murmur heard. Pulmonary/Chest: Effort normal and breath sounds normal. No respiratory distress. She has no wheezes. She has no rales. She exhibits no tenderness.  Abdominal: Soft. There is  tenderness in the right upper quadrant and epigastric area. There is guarding. There is no rebound and no CVA tenderness.  Musculoskeletal: Normal range of motion.  Paraspinal thoracic tenderness.   Neurological: She is alert and oriented to person, place, and time.  Skin: Skin is warm and dry.  Psychiatric: She has a normal mood and affect. Her behavior is normal.    ED Course  Procedures (including critical care time)  DIAGNOSTIC STUDIES: Oxygen Saturation is 100% on room air, normal by my interpretation.    COORDINATION OF CARE: 9:11 PM -Will order chest x-ray, blood lipase, Urinalysis, Troponin I, D-dimer, pregnancy, CBC, and CMP. and Patient verbalizes understanding and agrees with treatment plan.    Labs Review Labs Reviewed  COMPREHENSIVE METABOLIC PANEL - Abnormal; Notable for the following:    Glucose, Bld 110 (*)    All other components within normal limits  D-DIMER, QUANTITATIVE - Abnormal; Notable for the following:    D-Dimer, Quant 0.57 (*)    All other components within normal limits  CBC WITH DIFFERENTIAL  LIPASE, BLOOD  URINALYSIS, ROUTINE W REFLEX MICROSCOPIC  TROPONIN I  PREGNANCY, URINE   Imaging Review Dg Chest 2 View  06/11/2013   CLINICAL DATA:  Mid back pain, radiating to the chest.  EXAM: CHEST  2 VIEW  COMPARISON:  Chest radiograph performed 03/15/2011  FINDINGS: The lungs are well-aerated and clear. There is no evidence of focal opacification, pleural effusion or pneumothorax.  The heart is normal in size; the mediastinal contour is within normal limits. No acute osseous abnormalities are seen.  IMPRESSION: No acute cardiopulmonary process seen.   Electronically Signed   By: Garald Balding M.D.   On: 06/11/2013 22:15   Ct Angio Chest Pe W/cm &/or Wo Cm  06/12/2013   CLINICAL DATA:  Mid back pain, radiating to the chest. Right upper quadrant abdominal pain.  EXAM: CT ANGIOGRAPHY CHEST  CT ABDOMEN AND PELVIS WITH CONTRAST  TECHNIQUE: Multidetector CT  imaging of the chest was performed using the standard protocol during bolus administration of intravenous contrast. Multiplanar CT image reconstructions including MIPs were obtained to evaluate the vascular anatomy. Multidetector CT imaging of the abdomen and pelvis was performed using the standard protocol during bolus administration of intravenous contrast.  CONTRAST:  135mL OMNIPAQUE IOHEXOL 350 MG/ML SOLN, 60mL OMNIPAQUE IOHEXOL 300 MG/ML SOLN  COMPARISON:  Chest radiograph performed earlier today at 9:57 p.m., and CT of  the abdomen and pelvis performed 01/16/2005  FINDINGS: CTA CHEST FINDINGS  There is no evidence of aortic dissection. No aneurysmal dilatation is seen. The great vessels are unremarkable in appearance.  There is no evidence of pulmonary embolus.  The lungs are essentially clear bilaterally. There is no evidence of significant focal consolidation, pleural effusion or pneumothorax. No masses are identified; no abnormal focal contrast enhancement is seen.  The mediastinum is unremarkable in appearance. No mediastinal lymphadenopathy is seen. No pericardial effusion is identified. No axillary lymphadenopathy is seen. The thyroid gland is unremarkable in appearance.  No acute osseous abnormalities are seen.  CT ABDOMEN and PELVIS FINDINGS  The liver and spleen are unremarkable in appearance. An apparent 2.0 cm stone is suggested at the neck of the gallbladder; the gallbladder is otherwise unremarkable in appearance. The pancreas and adrenal glands are unremarkable.  A 1.1 cm cyst is noted near the upper pole of the right kidney. A small 2 mm nonobstructing stone is noted at the interpole region of the right kidney, and a smaller stone is seen more inferiorly. The left kidney is unremarkable in appearance. No perinephric stranding is seen. There is no evidence of hydronephrosis.  No free fluid is identified. The small bowel is unremarkable in appearance. The stomach is within normal limits. No acute  vascular abnormalities are seen.  The appendix is normal in caliber, without evidence for appendicitis. The colon is unremarkable in appearance.  The bladder is mildly distended. A few small stones are seen layering dependently within the bladder. The bladder is otherwise unremarkable in appearance. The uterus is within normal limits, with an intrauterine device noted in expected position at the fundus of the uterus. The ovaries are relatively symmetric; no suspicious adnexal masses are seen. No inguinal lymphadenopathy is seen.  No acute osseous abnormalities are identified.  Review of the MIP images confirms the above findings.  IMPRESSION: 1. No evidence of aortic dissection; no aneurysmal dilatation seen. No evidence of calcific atherosclerotic disease. 2. No evidence of pulmonary embolus. 3. Lungs clear bilaterally. 4. Cholelithiasis noted; gallbladder otherwise unremarkable in appearance. 5. Few small stones seen layering dependently within the bladder; bladder otherwise unremarkable in appearance. No evidence of hydronephrosis. No obstructing ureteral stones seen. 6. Tiny nonobstructing stones within the right kidney; small right renal cyst seen.   Electronically Signed   By: Garald Balding M.D.   On: 06/12/2013 00:13   Ct Abdomen Pelvis W Contrast  06/12/2013   CLINICAL DATA:  Mid back pain, radiating to the chest. Right upper quadrant abdominal pain.  EXAM: CT ANGIOGRAPHY CHEST  CT ABDOMEN AND PELVIS WITH CONTRAST  TECHNIQUE: Multidetector CT imaging of the chest was performed using the standard protocol during bolus administration of intravenous contrast. Multiplanar CT image reconstructions including MIPs were obtained to evaluate the vascular anatomy. Multidetector CT imaging of the abdomen and pelvis was performed using the standard protocol during bolus administration of intravenous contrast.  CONTRAST:  142mL OMNIPAQUE IOHEXOL 350 MG/ML SOLN, 68mL OMNIPAQUE IOHEXOL 300 MG/ML SOLN  COMPARISON:   Chest radiograph performed earlier today at 9:57 p.m., and CT of the abdomen and pelvis performed 01/16/2005  FINDINGS: CTA CHEST FINDINGS  There is no evidence of aortic dissection. No aneurysmal dilatation is seen. The great vessels are unremarkable in appearance.  There is no evidence of pulmonary embolus.  The lungs are essentially clear bilaterally. There is no evidence of significant focal consolidation, pleural effusion or pneumothorax. No masses are identified; no abnormal focal contrast  enhancement is seen.  The mediastinum is unremarkable in appearance. No mediastinal lymphadenopathy is seen. No pericardial effusion is identified. No axillary lymphadenopathy is seen. The thyroid gland is unremarkable in appearance.  No acute osseous abnormalities are seen.  CT ABDOMEN and PELVIS FINDINGS  The liver and spleen are unremarkable in appearance. An apparent 2.0 cm stone is suggested at the neck of the gallbladder; the gallbladder is otherwise unremarkable in appearance. The pancreas and adrenal glands are unremarkable.  A 1.1 cm cyst is noted near the upper pole of the right kidney. A small 2 mm nonobstructing stone is noted at the interpole region of the right kidney, and a smaller stone is seen more inferiorly. The left kidney is unremarkable in appearance. No perinephric stranding is seen. There is no evidence of hydronephrosis.  No free fluid is identified. The small bowel is unremarkable in appearance. The stomach is within normal limits. No acute vascular abnormalities are seen.  The appendix is normal in caliber, without evidence for appendicitis. The colon is unremarkable in appearance.  The bladder is mildly distended. A few small stones are seen layering dependently within the bladder. The bladder is otherwise unremarkable in appearance. The uterus is within normal limits, with an intrauterine device noted in expected position at the fundus of the uterus. The ovaries are relatively symmetric; no  suspicious adnexal masses are seen. No inguinal lymphadenopathy is seen.  No acute osseous abnormalities are identified.  Review of the MIP images confirms the above findings.  IMPRESSION: 1. No evidence of aortic dissection; no aneurysmal dilatation seen. No evidence of calcific atherosclerotic disease. 2. No evidence of pulmonary embolus. 3. Lungs clear bilaterally. 4. Cholelithiasis noted; gallbladder otherwise unremarkable in appearance. 5. Few small stones seen layering dependently within the bladder; bladder otherwise unremarkable in appearance. No evidence of hydronephrosis. No obstructing ureteral stones seen. 6. Tiny nonobstructing stones within the right kidney; small right renal cyst seen.   Electronically Signed   By: Garald Balding M.D.   On: 06/12/2013 00:13    EKG Interpretation    Date/Time:  Saturday June 11 2013 20:10:50 EST Ventricular Rate:  73 PR Interval:  158 QRS Duration: 68 QT Interval:  378 QTC Calculation: 416 R Axis:   53 Text Interpretation:  Normal sinus rhythm with sinus arrhythmia Normal ECG No significant change was found Confirmed by Wyvonnia Dusky  MD, Hamda Klutts ([id number]) on 06/11/2013 8:20:58 PM            MDM   1. RUQ pain   2. Back pain    Upper back pain since last night it radiates to the chest. Denies any shortness of breath, cough or fever. Denies any injury. No focal weakness, numbness or tingling.  On examination the she has right upper quadrant tenderness. Concern for possible gallbladder pathology causing referred back pain.  LFTs normal. Lipase normal. D-dimer is positive. No evidence of PE. CT abdomen shows gall stones without cholecystitis.   Back pain could be musculoskeletal but will have return for [place] of gallbladder tomorrow.  I personally performed the services described in this documentation, which was scribed in my presence. The recorded information has been reviewed and is accurate.    Ezequiel Essex, MD 06/12/13 1146

## 2013-06-12 ENCOUNTER — Ambulatory Visit (HOSPITAL_COMMUNITY)
Admission: RE | Admit: 2013-06-12 | Discharge: 2013-06-12 | Disposition: A | Discharge status: Home / Self Care | Payer: Medicaid Other | Source: Ambulatory Visit | Attending: Emergency Medicine | Admitting: Emergency Medicine

## 2013-06-12 DIAGNOSIS — K8020 Calculus of gallbladder without cholecystitis without obstruction: Secondary | ICD-10-CM | POA: Insufficient documentation

## 2013-06-12 MED ORDER — HYDROCODONE-ACETAMINOPHEN 5-325 MG PO TABS: 2.0000 | ORAL_TABLET | ORAL | Status: DC | PRN

## 2013-06-12 MED ORDER — ONDANSETRON HCL 4 MG PO TABS: 4.0000 mg | ORAL_TABLET | Freq: Four times a day (QID) | ORAL | Status: DC

## 2013-06-12 NOTE — Discharge Instructions (Signed)
Abdominal Pain, Adult Followup for an ultrasound of the gallbladder at [facility] tomorrow. Return to the ED if you develop new or worsening symptoms. Many things can cause abdominal pain. Usually, abdominal pain is not caused by a disease and will improve without treatment. It can often be observed and treated at home. Your health care provider will do a physical exam and possibly order blood tests and X-rays to help determine the seriousness of your pain. However, in many cases, more time must pass before a clear cause of the pain can be found. Before that point, your health care provider may not know if you need more testing or further treatment. HOME CARE INSTRUCTIONS  Monitor your abdominal pain for any changes. The following actions may help to alleviate any discomfort you are experiencing:  Only take over-the-counter or prescription medicines as directed by your health care provider.  Do not take laxatives unless directed to do so by your health care provider.  Try a clear liquid diet (broth, tea, or water) as directed by your health care provider. Slowly move to a bland diet as tolerated. SEEK MEDICAL CARE IF:  You have unexplained abdominal pain.  You have abdominal pain associated with nausea or diarrhea.  You have pain when you urinate or have a bowel movement.  You experience abdominal pain that wakes you in the night.  You have abdominal pain that is worsened or improved by eating food.  You have abdominal pain that is worsened with eating fatty foods. SEEK IMMEDIATE MEDICAL CARE IF:   Your pain does not go away within 2 hours.  You have a fever.  You keep throwing up (vomiting).  Your pain is felt only in portions of the abdomen, such as the right side or the left lower portion of the abdomen.  You pass bloody or black tarry stools. MAKE SURE YOU:  Understand these instructions.   Will watch your condition.   Will get help right away if you are not doing well  or get worse.  Document Released: 02/19/2005 Document Revised: 03/02/2013 Document Reviewed: 01/19/2013 [facility] Patient Information 2014 [place].

## 2013-06-13 ENCOUNTER — Encounter: Payer: Self-pay | Admitting: Family Medicine

## 2013-06-13 ENCOUNTER — Ambulatory Visit (INDEPENDENT_AMBULATORY_CARE_PROVIDER_SITE_OTHER): Payer: Medicaid Other | Admitting: Family Medicine

## 2013-06-13 VITALS — BP 113/75 | HR 71 | Ht 64.0 in | Wt 220.0 lb

## 2013-06-13 DIAGNOSIS — K8050 Calculus of bile duct without cholangitis or cholecystitis without obstruction: Secondary | ICD-10-CM | POA: Insufficient documentation

## 2013-06-13 DIAGNOSIS — K8020 Calculus of gallbladder without cholecystitis without obstruction: Secondary | ICD-10-CM

## 2013-06-13 MED ORDER — HYDROCODONE-ACETAMINOPHEN 5-325 MG PO TABS: 2.0000 | ORAL_TABLET | ORAL | Status: DC | PRN

## 2013-06-13 NOTE — Patient Instructions (Signed)
Ms. Sallie,   It was nice to see you. Sorry about the abdominal pain. I will make a referral to the general surgeon to discuss gall bladder removal. Take the hydrocodone as needed. If your symptoms worsening or you develop fever, then please call me. If you have not heard from the surgery office by next week, then please call me.   Take Care,   Dr. Maricela Bo

## 2013-06-13 NOTE — Assessment & Plan Note (Signed)
A: given presence of gall stones and sludge as well as current and recent symptoms, the pt appears to be experiencing biliary colic without cholecystitis P:  - Refer to general surgery for eval of gall bladder dysfunction (may consider a HIDA scan) - Given Rx for Norco 5-235 mg # 15 to take as needed

## 2013-06-13 NOTE — Progress Notes (Signed)
   Subjective:    Patient ID: Katherine Fritz, female    DOB: 1980-09-16, 33 y.o.   MRN: [id number]  HPI  33 year old F who presented to ED 2 days ago with abdominal and chest pain. Ruled out MI, PE, aortic dissection and acute cholecystitis. However, there were gallstone evident so pt told to f/u for abdominal ultrasound. The f/u ultrasound showed gallstones and sludge without inflammation of the gallbladder. Pt without history of cholecystitis or biliary colic.   Current pain:dull pain in central back radiating to chest (pt points centrally between her breasts) Taking: Vicodin 5-325 mg (given Rx from EDP) and zofran Other treatments tried: ibuprofen but ineffective   Denies SOB, vomiting, nausea, fever, chills, diarrhea, constipation (1-2 soft BM per day) and no association with eating   Review of Systems See HPI    Objective:   Physical Exam BP 113/75  Pulse 71  Ht 5\' 4"  (1.626 m)  Wt 220 lb (99.791 kg)  BMI 37.74 kg/m2  Breastfeeding? No Gen: well appearing, obese female, non ill non distressed CV: RRR, no murmurs Pulm: CTA-B Chest Wall: tenderness to palpation of sternum without bony abnormality, swelling or palpable mass Back: tenderness of right thoracic paraspinal muscle without palpable abnormality Abd: soft, non distended, mild tenderness to deep palpation of RUQ otherwise no tenderness in other quadrants, hypoactive bowel sounds throughout  Reviewed all labs and imaging from previous 72 hours     Assessment & Plan:

## 2013-06-16 ENCOUNTER — Telehealth: Payer: Self-pay | Admitting: Family Medicine

## 2013-06-16 NOTE — Telephone Encounter (Signed)
If the patient is willing to drive to [facility], [place] or [facility], then there are options. There are no other groups in [facility]. I will ask our referral coordinator contact the patient to determine this.

## 2013-06-16 NOTE — Telephone Encounter (Signed)
Patient has family planning [facility] will not allow patient to make payments. She is not able to pay the whole fee up front and wondering if there is another surgery center she can be referred to that will allow her to make payment. Please be advised.

## 2013-06-18 ENCOUNTER — Emergency Department (HOSPITAL_COMMUNITY)
Admission: EM | Admit: 2013-06-18 | Discharge: 2013-06-18 | Disposition: A | Discharge status: Home / Self Care | Payer: Medicaid Other | Attending: Emergency Medicine | Admitting: Emergency Medicine

## 2013-06-18 ENCOUNTER — Encounter (HOSPITAL_COMMUNITY): Payer: Self-pay | Admitting: Emergency Medicine

## 2013-06-18 ENCOUNTER — Emergency Department (HOSPITAL_COMMUNITY): Payer: Medicaid Other

## 2013-06-18 DIAGNOSIS — Z87442 Personal history of urinary calculi: Secondary | ICD-10-CM | POA: Insufficient documentation

## 2013-06-18 DIAGNOSIS — Z87891 Personal history of nicotine dependence: Secondary | ICD-10-CM | POA: Insufficient documentation

## 2013-06-18 DIAGNOSIS — K8020 Calculus of gallbladder without cholecystitis without obstruction: Secondary | ICD-10-CM | POA: Insufficient documentation

## 2013-06-18 DIAGNOSIS — R630 Anorexia: Secondary | ICD-10-CM | POA: Insufficient documentation

## 2013-06-18 DIAGNOSIS — M25519 Pain in unspecified shoulder: Secondary | ICD-10-CM | POA: Insufficient documentation

## 2013-06-18 DIAGNOSIS — Z8614 Personal history of Methicillin resistant Staphylococcus aureus infection: Secondary | ICD-10-CM | POA: Insufficient documentation

## 2013-06-18 DIAGNOSIS — K8050 Calculus of bile duct without cholangitis or cholecystitis without obstruction: Secondary | ICD-10-CM

## 2013-06-18 DIAGNOSIS — Z880 Allergy status to penicillin: Secondary | ICD-10-CM | POA: Insufficient documentation

## 2013-06-18 DIAGNOSIS — Z79899 Other long term (current) drug therapy: Secondary | ICD-10-CM | POA: Insufficient documentation

## 2013-06-18 DIAGNOSIS — Z8659 Personal history of other mental and behavioral disorders: Secondary | ICD-10-CM | POA: Insufficient documentation

## 2013-06-18 DIAGNOSIS — M549 Dorsalgia, unspecified: Secondary | ICD-10-CM | POA: Insufficient documentation

## 2013-06-18 LAB — URINALYSIS, ROUTINE W REFLEX MICROSCOPIC
Bilirubin Urine: NEGATIVE
Glucose, UA: NEGATIVE mg/dL
Hgb urine dipstick: NEGATIVE
Ketones, ur: NEGATIVE mg/dL
LEUKOCYTES UA: NEGATIVE
NITRITE: NEGATIVE
PH: 7.5 (ref 5.0–8.0)
Protein, ur: NEGATIVE mg/dL
SPECIFIC GRAVITY, URINE: 1.022 (ref 1.005–1.030)
Urobilinogen, UA: 1 mg/dL (ref 0.0–1.0)

## 2013-06-18 LAB — COMPREHENSIVE METABOLIC PANEL
ALT: 18 U/L (ref 0–35)
AST: 19 U/L (ref 0–37)
Albumin: 3.8 g/dL (ref 3.5–5.2)
Alkaline Phosphatase: 93 U/L (ref 39–117)
BUN: 12 mg/dL (ref 6–23)
CALCIUM: 9.3 mg/dL (ref 8.4–10.5)
CO2: 23 mEq/L (ref 19–32)
Chloride: 102 mEq/L (ref 96–112)
Creatinine, Ser: 0.82 mg/dL (ref 0.50–1.10)
GFR calc non Af Amer: >90 mL/min (ref >90)
Glucose, Bld: 83 mg/dL (ref 70–99)
Potassium: 4 mEq/L (ref 3.7–5.3)
Sodium: 139 mEq/L (ref 137–147)
TOTAL PROTEIN: 8.1 g/dL (ref 6.0–8.3)
Total Bilirubin: 0.3 mg/dL (ref 0.3–1.2)

## 2013-06-18 LAB — CBC
HEMATOCRIT: 38.7 % (ref 36.0–46.0)
Hemoglobin: 13.3 g/dL (ref 12.0–15.0)
MCH: 29.7 pg (ref 26.0–34.0)
MCHC: 34.4 g/dL (ref 30.0–36.0)
MCV: 86.4 fL (ref 78.0–100.0)
Platelets: 346 10*3/uL (ref 150–400)
RBC: 4.48 MIL/uL (ref 3.87–5.11)
RDW: 13.2 % (ref 11.5–15.5)
WBC: 9.7 10*3/uL (ref 4.0–10.5)

## 2013-06-18 LAB — LIPASE, BLOOD: Lipase: 30 U/L (ref 11–59)

## 2013-06-18 LAB — POCT I-STAT TROPONIN I: TROPONIN I, POC: 0 ng/mL (ref 0.00–0.08)

## 2013-06-18 MED ORDER — OXYCODONE-ACETAMINOPHEN 5-325 MG PO TABS: 2.0000 | ORAL_TABLET | Freq: Four times a day (QID) | ORAL | Status: DC | PRN

## 2013-06-18 MED ORDER — ONDANSETRON HCL 4 MG/2ML IJ SOLN
4.0000 mg | Freq: Once | INTRAMUSCULAR | Status: AC
  Administered 2013-06-18: 4 mg via INTRAVENOUS
  Filled 2013-06-18: qty 2

## 2013-06-18 MED ORDER — SODIUM CHLORIDE 0.9 % IV BOLUS (SEPSIS)
1000.0000 mL | Freq: Once | INTRAVENOUS | Status: AC
  Administered 2013-06-18: 1000 mL via INTRAVENOUS

## 2013-06-18 MED ORDER — MORPHINE SULFATE 4 MG/ML IJ SOLN
4.0000 mg | Freq: Once | INTRAMUSCULAR | Status: AC
  Administered 2013-06-18: 4 mg via INTRAVENOUS
  Filled 2013-06-18: qty 1

## 2013-06-18 NOTE — ED Notes (Signed)
Pt seen last week at [facility] with same complaint.  They felt is was probably gallbladder.  Pt had u/s at [facility] Sunday.  Pt was to f/u with surgeon, but is waiting for call back.

## 2013-06-18 NOTE — Discharge Instructions (Signed)
Take percocet instead of vicodin. Do NOT drive with it.   Follow up with surgery.   Return to ER if you have fever, severe pain, vomiting.

## 2013-06-18 NOTE — ED Provider Notes (Signed)
CSN: [id number]     Arrival date & time 06/18/13  1438 History   First MD Initiated Contact with Patient 06/18/13 1507     Chief Complaint  Patient presents with  . Chest Pain  . Back Pain   (Consider location/radiation/quality/duration/timing/severity/associated sxs/prior Treatment) The history is provided by the patient.  Katherine Fritz is a 33 y.o. female hx of kidney stones, here with RUQ pain. Increasing RUQ pain for the last several days. Sharp, pressure feeling, radiates to epigastrium and back and shoulder. Has decreased appetite but no vomiting. Was recently diagnosed with cholelithiasis with [place] several days ago. Has been taking norco with some relief. No fevers or chills. No urinary symptoms.    Past Medical History  Diagnosis Date  . MRSA infection   . Kidney stones 2009  . Abnormal Pap smear     Colposcopy normal  . Depression     Not on current treatment   Past Surgical History  Procedure Laterality Date  . Colposcopy     Family History  Problem Relation Age of Onset  . COPD Mother   . Hypertension Mother   . Heart disease Mother   . Hyperlipidemia Sister   . Hypertension Sister   . Depression Brother   . Drug abuse Brother   . Cancer Paternal Aunt   . Cancer Paternal Uncle    History  Substance Use Topics  . Smoking status: Former Smoker -- 1.00 packs/day    Types: Cigarettes    Quit date: 09/04/2008  . Smokeless tobacco: Never Used  . Alcohol Use: No   OB History   Grav Para Term Preterm Abortions TAB SAB Ect Mult Living   4 4 4       4      Review of Systems  Gastrointestinal: Positive for abdominal pain.  Musculoskeletal: Positive for back pain.  All other systems reviewed and are negative.    Allergies  Penicillins  Home Medications   Current Outpatient Rx  Name  Route  Sig  Dispense  Refill  . cyclobenzaprine (FLEXERIL) 10 MG tablet   Oral   Take 10 mg by mouth 3 (three) times daily as needed for muscle spasms.         Marland Kitchen  HYDROcodone-acetaminophen (NORCO/VICODIN) 5-325 MG per tablet   Oral   Take 1 tablet by mouth every 6 (six) hours as needed for moderate pain.         Marland Kitchen ondansetron (ZOFRAN) 4 MG tablet   Oral   Take 1 tablet (4 mg total) by mouth every 6 (six) hours.   12 tablet   0   . Prenatal Vit-Fe Fumarate-FA (PRENATAL MULTIVITAMIN) TABS   Oral   Take 1 tablet by mouth daily at 12 noon.          BP 105/67  Pulse 87  Temp(Src) 98 F (36.7 C) (Oral)  Resp 18  Ht 5\' 4"  (1.626 m)  Wt 220 lb (99.791 kg)  BMI 37.74 kg/m2  SpO2 99% Physical Exam  Nursing note and vitals reviewed. Constitutional: She is oriented to person, place, and time. She appears well-developed and well-nourished.  HENT:  Head: Normocephalic.  Mouth/Throat: Oropharynx is clear and moist.  Eyes: Conjunctivae are normal. Pupils are equal, round, and reactive to light.  Neck: Normal range of motion. Neck supple.  Cardiovascular: Normal rate, regular rhythm and normal heart sounds.   Pulmonary/Chest: Effort normal and breath sounds normal. No respiratory distress. She has no wheezes. She has no  rales.  Abdominal: Soft.  RUQ tenderness, ? Murphy, no rebound. R CVAT   Musculoskeletal: Normal range of motion. She exhibits no edema and no tenderness.  Neurological: She is alert and oriented to person, place, and time. No cranial nerve deficit. Coordination normal.  Skin: Skin is warm and dry.  Psychiatric: She has a normal mood and affect. Her behavior is normal. Judgment and thought content normal.    ED Course  Procedures (including critical care time) Labs Review Labs Reviewed  CBC  COMPREHENSIVE METABOLIC PANEL  URINALYSIS, ROUTINE W REFLEX MICROSCOPIC  LIPASE, BLOOD  POCT I-STAT TROPONIN I   Imaging Review US Abdomen Complete  06/18/2013   CLINICAL DATA:  Right flank pain. Rule out kidney stone versus gallstones.  EXAM: ULTRASOUND ABDOMEN COMPLETE  COMPARISON:  CT 06/11/2013  FINDINGS: Gallbladder:  There is  a 2 cm stone over body/fundus with minimal sludge. There is no significant wall thickening or pericholecystic fluid. There is an negative sonographic Murphy's sign.  Common bile duct:  Diameter: 5.8 mm.  Liver:  No focal lesion identified. Within normal limits in parenchymal echogenicity.  IVC:  No abnormality visualized.  Pancreas:  Visualized portion unremarkable.  Spleen:  Size and appearance within normal limits.  Right Kidney:  Length: 12.1 cm. Echogenicity within normal limits. No mass or hydronephrosis visualized.  Left Kidney:  Length: 12.4 cm. Echogenicity within normal limits. No mass or hydronephrosis visualized.  Abdominal aorta:  No aneurysm visualized.  Other findings:  None.  IMPRESSION: 2 cm gallstone with gallbladder sludge. No additional sonographic evidence to suggest cholecystitis.   Electronically Signed   By: Marin Olp M.D.   On: 06/18/2013 17:12    EKG Interpretation    Date/Time:  Saturday June 18 2013 14:44:08 EST Ventricular Rate:  81 PR Interval:  146 QRS Duration: 74 QT Interval:  360 QTC Calculation: 418 R Axis:   64 Text Interpretation:  Normal sinus rhythm Normal ECG No significant change since last tracing Confirmed by YAO  MD, DAVID [phone]) on 06/18/2013 3:07:46 PM            MDM  No diagnosis found. Katherine Fritz is a 33 y.o. female here with RUQ pain. Likely cholelithiasis vs nephrolithiasis. Will get labs, repeat US and give pain meds and reassess.   5:23 PM Labs nl. US showed gallbladder sludge no acute chole. No hydro. Pain free after morphine. Will switch to percocet from vicodin. Will d/c home with [place] surgery f/u.    Wandra Arthurs, MD 06/18/13 [phone]

## 2013-06-18 NOTE — ED Notes (Signed)
Patient transported to Ultrasound 

## 2013-06-28 ENCOUNTER — Telehealth: Payer: Self-pay | Admitting: Family Medicine

## 2013-06-28 DIAGNOSIS — K8050 Calculus of bile duct without cholangitis or cholecystitis without obstruction: Secondary | ICD-10-CM

## 2013-06-28 NOTE — Telephone Encounter (Signed)
Pt has surgery 2/19. She is almost out of her pain meds.  Her surgeon will not give pain meds until after the surgery. She needs some now Please advise

## 2013-06-28 NOTE — Telephone Encounter (Signed)
Will fwd. To PCP for review. Thanks. Javier Glazier, Gerrit Heck

## 2013-06-29 MED ORDER — OXYCODONE-ACETAMINOPHEN 5-325 MG PO TABS: 2.0000 | ORAL_TABLET | Freq: Four times a day (QID) | ORAL | Status: DC | PRN

## 2013-06-29 NOTE — Telephone Encounter (Signed)
Please tell pt that med can be picked up today. An appt will be needed for further refills.

## 2013-06-30 NOTE — Telephone Encounter (Signed)
Called pt. Informed. .Katherine Fritz  

## 2013-08-05 ENCOUNTER — Other Ambulatory Visit: Payer: Self-pay | Admitting: Obstetrics & Gynecology

## 2013-08-05 ENCOUNTER — Other Ambulatory Visit (HOSPITAL_COMMUNITY)
Admission: RE | Admit: 2013-08-05 | Discharge: 2013-08-05 | Disposition: A | Discharge status: Home / Self Care | Payer: Medicaid Other | Source: Ambulatory Visit | Attending: Obstetrics & Gynecology | Admitting: Obstetrics & Gynecology

## 2013-08-05 DIAGNOSIS — Z01419 Encounter for gynecological examination (general) (routine) without abnormal findings: Secondary | ICD-10-CM | POA: Insufficient documentation

## 2013-08-05 DIAGNOSIS — Z1151 Encounter for screening for human papillomavirus (HPV): Secondary | ICD-10-CM | POA: Insufficient documentation

## 2013-09-02 ENCOUNTER — Other Ambulatory Visit: Payer: Self-pay | Admitting: Lactation Services

## 2013-09-02 DIAGNOSIS — E010 Iodine-deficiency related diffuse (endemic) goiter: Secondary | ICD-10-CM

## 2013-09-15 ENCOUNTER — Ambulatory Visit
Admission: RE | Admit: 2013-09-15 | Discharge: 2013-09-15 | Disposition: A | Discharge status: Home / Self Care | Payer: BC Managed Care – PPO | Source: Ambulatory Visit | Attending: Family Medicine | Admitting: Family Medicine

## 2013-09-15 DIAGNOSIS — E010 Iodine-deficiency related diffuse (endemic) goiter: Secondary | ICD-10-CM

## 2013-09-16 ENCOUNTER — Other Ambulatory Visit: Payer: Medicaid Other

## 2014-02-07 ENCOUNTER — Encounter (HOSPITAL_COMMUNITY): Payer: Self-pay | Admitting: Emergency Medicine

## 2014-02-07 ENCOUNTER — Emergency Department (HOSPITAL_COMMUNITY)
Admission: EM | Admit: 2014-02-07 | Discharge: 2014-02-08 | Disposition: A | Discharge status: Home / Self Care | Payer: BC Managed Care – PPO | Attending: Emergency Medicine | Admitting: Emergency Medicine

## 2014-02-07 DIAGNOSIS — Z880 Allergy status to penicillin: Secondary | ICD-10-CM | POA: Diagnosis not present

## 2014-02-07 DIAGNOSIS — Z87440 Personal history of urinary (tract) infections: Secondary | ICD-10-CM | POA: Insufficient documentation

## 2014-02-07 DIAGNOSIS — Z9889 Other specified postprocedural states: Secondary | ICD-10-CM | POA: Insufficient documentation

## 2014-02-07 DIAGNOSIS — R109 Unspecified abdominal pain: Secondary | ICD-10-CM | POA: Diagnosis present

## 2014-02-07 DIAGNOSIS — Z79899 Other long term (current) drug therapy: Secondary | ICD-10-CM | POA: Diagnosis not present

## 2014-02-07 DIAGNOSIS — N83209 Unspecified ovarian cyst, unspecified side: Secondary | ICD-10-CM | POA: Insufficient documentation

## 2014-02-07 DIAGNOSIS — F329 Major depressive disorder, single episode, unspecified: Secondary | ICD-10-CM | POA: Insufficient documentation

## 2014-02-07 DIAGNOSIS — F3289 Other specified depressive episodes: Secondary | ICD-10-CM | POA: Diagnosis not present

## 2014-02-07 DIAGNOSIS — Z9089 Acquired absence of other organs: Secondary | ICD-10-CM | POA: Insufficient documentation

## 2014-02-07 DIAGNOSIS — Z87891 Personal history of nicotine dependence: Secondary | ICD-10-CM | POA: Diagnosis not present

## 2014-02-07 DIAGNOSIS — Z3202 Encounter for pregnancy test, result negative: Secondary | ICD-10-CM | POA: Insufficient documentation

## 2014-02-07 DIAGNOSIS — Z8614 Personal history of Methicillin resistant Staphylococcus aureus infection: Secondary | ICD-10-CM | POA: Diagnosis not present

## 2014-02-07 DIAGNOSIS — N83202 Unspecified ovarian cyst, left side: Secondary | ICD-10-CM

## 2014-02-07 LAB — CBC WITH DIFFERENTIAL/PLATELET
Basophils Absolute: 0 10*3/uL (ref 0.0–0.1)
Basophils Relative: 0 % (ref 0–1)
Eosinophils Absolute: 0.1 10*3/uL (ref 0.0–0.7)
Eosinophils Relative: 1 % (ref 0–5)
HEMATOCRIT: 41.3 % (ref 36.0–46.0)
Hemoglobin: 14 g/dL (ref 12.0–15.0)
LYMPHS PCT: 22 % (ref 12–46)
Lymphs Abs: 2.3 10*3/uL (ref 0.7–4.0)
MCH: 29.4 pg (ref 26.0–34.0)
MCHC: 33.9 g/dL (ref 30.0–36.0)
MCV: 86.8 fL (ref 78.0–100.0)
Monocytes Absolute: 0.7 10*3/uL (ref 0.1–1.0)
Monocytes Relative: 7 % (ref 3–12)
NEUTROS ABS: 7.3 10*3/uL (ref 1.7–7.7)
Neutrophils Relative %: 70 % (ref 43–77)
Platelets: ADEQUATE 10*3/uL (ref 150–400)
RBC: 4.76 MIL/uL (ref 3.87–5.11)
RDW: 13.3 % (ref 11.5–15.5)
Smear Review: ADEQUATE
WBC: 10.4 10*3/uL (ref 4.0–10.5)

## 2014-02-07 LAB — URINALYSIS, ROUTINE W REFLEX MICROSCOPIC
Bilirubin Urine: NEGATIVE
Glucose, UA: NEGATIVE mg/dL
Hgb urine dipstick: NEGATIVE
Ketones, ur: 15 mg/dL — AB
LEUKOCYTES UA: NEGATIVE
Nitrite: NEGATIVE
PROTEIN: NEGATIVE mg/dL
Specific Gravity, Urine: 1.018 (ref 1.005–1.030)
UROBILINOGEN UA: 0.2 mg/dL (ref 0.0–1.0)
pH: 5.5 (ref 5.0–8.0)

## 2014-02-07 LAB — COMPREHENSIVE METABOLIC PANEL
ALT: 12 U/L (ref 0–35)
AST: 15 U/L (ref 0–37)
Albumin: 4.1 g/dL (ref 3.5–5.2)
Alkaline Phosphatase: 88 U/L (ref 39–117)
Anion gap: 15 (ref 5–15)
BUN: 9 mg/dL (ref 6–23)
CALCIUM: 9.7 mg/dL (ref 8.4–10.5)
CO2: 23 mEq/L (ref 19–32)
Chloride: 101 mEq/L (ref 96–112)
Creatinine, Ser: 0.83 mg/dL (ref 0.50–1.10)
GFR calc Af Amer: >90 mL/min (ref >90)
GFR calc non Af Amer: >90 mL/min (ref >90)
Glucose, Bld: 92 mg/dL (ref 70–99)
Potassium: 4 mEq/L (ref 3.7–5.3)
SODIUM: 139 meq/L (ref 137–147)
Total Bilirubin: 0.4 mg/dL (ref 0.3–1.2)
Total Protein: 7.7 g/dL (ref 6.0–8.3)

## 2014-02-07 LAB — PREGNANCY, URINE: PREG TEST UR: NEGATIVE

## 2014-02-07 LAB — LIPASE, BLOOD: LIPASE: 30 U/L (ref 11–59)

## 2014-02-07 NOTE — ED Notes (Signed)
Pt states that she started having lower abdominal and back pain yesterday that has worsened. Denies vaginal discharge or urinary symptoms. Sent from [place] after hours clinic for further eval. Alert and oriented.

## 2014-02-08 ENCOUNTER — Emergency Department (HOSPITAL_COMMUNITY): Payer: BC Managed Care – PPO

## 2014-02-08 LAB — WET PREP, GENITAL
Clue Cells Wet Prep HPF POC: NONE SEEN
Trich, Wet Prep: NONE SEEN
YEAST WET PREP: NONE SEEN

## 2014-02-08 MED ORDER — OXYCODONE-ACETAMINOPHEN 5-325 MG PO TABS: 1.0000 | ORAL_TABLET | Freq: Four times a day (QID) | ORAL | Status: DC | PRN

## 2014-02-08 MED ORDER — MORPHINE SULFATE 4 MG/ML IJ SOLN
4.0000 mg | Freq: Once | INTRAMUSCULAR | Status: AC
  Administered 2014-02-08: 4 mg via INTRAMUSCULAR
  Filled 2014-02-08: qty 1

## 2014-02-08 MED ORDER — NAPROXEN 500 MG PO TABS: 500.0000 mg | ORAL_TABLET | Freq: Two times a day (BID) | ORAL | Status: DC

## 2014-02-08 NOTE — ED Provider Notes (Signed)
Medical screening examination/treatment/procedure(s) were performed by non-physician practitioner and as supervising physician I was immediately available for consultation/collaboration.   EKG Interpretation None        Wandra Arthurs, MD 02/08/14 [phone]

## 2014-02-08 NOTE — ED Notes (Signed)
Pt off the floor to US

## 2014-02-08 NOTE — ED Provider Notes (Signed)
CSN: [id number]     Arrival date & time 02/07/14  2149 History   First MD Initiated Contact with Patient 02/08/14 0056     Chief Complaint  Patient presents with  . Abdominal Pain  . Back Pain    (Consider location/radiation/quality/duration/timing/severity/associated sxs/prior Treatment) HPI Comments: Patient is a 33 year old G5P4 female with a history of kidney stones and depression who presents to the emergency department for abdominal pain. Patient states that she has been having suprapubic abdominal pain x2 days. Pain is constant and waxing/waning in severity. Patient states the pain intermittently will radiate around to her back. Patient has taken Aleve for symptoms without relief. She endorses associated nausea. Patient denies fever, vomiting, diarrhea, melena or hematochezia, dysuria, hematuria, vaginal bleeding, or vaginal discharge. Abdominal surgical history significant for cholecystectomy. Patient states her menses are irregular secondary to Mirena.  Patient is a 33 y.o. female presenting with abdominal pain and back pain. The history is provided by the patient. No language interpreter was used.  Abdominal Pain Associated symptoms: nausea   Back Pain Associated symptoms: abdominal pain     Past Medical History  Diagnosis Date  . MRSA infection   . Kidney stones 2009  . Abnormal Pap smear     Colposcopy normal  . Depression     Not on current treatment   Past Surgical History  Procedure Laterality Date  . Colposcopy    . Cholecystectomy     Family History  Problem Relation Age of Onset  . COPD Mother   . Hypertension Mother   . Heart disease Mother   . Hyperlipidemia Sister   . Hypertension Sister   . Depression Brother   . Drug abuse Brother   . Cancer Paternal Aunt   . Cancer Paternal Uncle    History  Substance Use Topics  . Smoking status: Former Smoker -- 1.00 packs/day    Types: Cigarettes    Quit date: 09/04/2008  . Smokeless tobacco: Never Used  .  Alcohol Use: No   OB History   Grav Para Term Preterm Abortions TAB SAB Ect Mult Living   4 4 4       4       Review of Systems  Gastrointestinal: Positive for nausea and abdominal pain.  Musculoskeletal: Positive for back pain.  All other systems reviewed and are negative.   Allergies  Penicillins  Home Medications   Prior to Admission medications   Medication Sig Start Date End Date Taking? Authorizing Provider  naproxen sodium (ANAPROX) 220 MG tablet Take 220 mg by mouth 2 (two) times daily as needed (pain).   Yes Historical Provider, MD  phentermine 30 MG capsule Take 30 mg by mouth every morning.   Yes Historical Provider, MD  buPROPion (WELLBUTRIN XL) 300 MG 24 hr tablet Take 1 tablet by mouth daily. 01/08/14   Historical Provider, MD  naproxen (NAPROSYN) 500 MG tablet Take 1 tablet (500 mg total) by mouth 2 (two) times daily. 02/08/14   Antonietta Breach, PA-C  oxyCODONE-acetaminophen (PERCOCET/ROXICET) 5-325 MG per tablet Take 1-2 tablets by mouth every 6 (six) hours as needed for moderate pain or severe pain. 02/08/14   Antonietta Breach, PA-C   BP 125/89  Pulse 88  Temp(Src) 98.9 F (37.2 C) (Oral)  Resp 18  SpO2 100%  Physical Exam  Nursing note and vitals reviewed. Constitutional: She is oriented to person, place, and time. She appears well-developed and well-nourished. No distress.  Nontoxic/nonseptic appearing  HENT:  Head: Normocephalic and  atraumatic.  Eyes: Conjunctivae and EOM are normal. No scleral icterus.  Neck: Normal range of motion.  Cardiovascular: Normal rate, regular rhythm, normal heart sounds and intact distal pulses.   Pulmonary/Chest: Effort normal and breath sounds normal. No respiratory distress. She has no wheezes. She has no rales.  Chest expansion symmetric  Abdominal: Soft. Normal appearance. She exhibits no distension and no ascites. There is tenderness in the suprapubic area and left lower quadrant. There is no rebound and no guarding.    Abdomen  is soft without masses. There is focal tenderness in the left lower quadrant and suprapubic region. No peritoneal signs or guarding. No rebound tenderness.  Genitourinary: Vagina normal. There is no rash, tenderness, lesion or injury on the right labia. There is no rash, tenderness, lesion or injury on the left labia. Uterus is tender (mild). Cervix exhibits no motion tenderness and no friability. Right adnexum displays no mass, no tenderness and no fullness. Left adnexum displays tenderness (mild). Left adnexum displays no mass and no fullness. No tenderness or bleeding around the vagina.  IUD strings visualized from cervical os.  Musculoskeletal: Normal range of motion.  Neurological: She is alert and oriented to person, place, and time. She exhibits normal muscle tone. Coordination normal.  GCS 15. Patient moves extremities without ataxia  Skin: Skin is warm and dry. No rash noted. She is not diaphoretic. No erythema. No pallor.  Psychiatric: She has a normal mood and affect. Her behavior is normal.    ED Course  Procedures (including critical care time) Labs Review Labs Reviewed  WET PREP, GENITAL - Abnormal; Notable for the following:    WBC, Wet Prep HPF POC MODERATE (*)    All other components within normal limits  URINALYSIS, ROUTINE W REFLEX MICROSCOPIC - Abnormal; Notable for the following:    Ketones, ur 15 (*)    All other components within normal limits  GC/CHLAMYDIA PROBE AMP  CBC WITH DIFFERENTIAL  COMPREHENSIVE METABOLIC PANEL  LIPASE, BLOOD  PREGNANCY, URINE    Imaging Review US Transvaginal Non-ob  02/08/2014   CLINICAL DATA:  Abdominal pain.  Suprapubic and back pain.  EXAM: TRANSABDOMINAL AND TRANSVAGINAL ULTRASOUND OF PELVIS  TECHNIQUE: Both transabdominal and transvaginal ultrasound examinations of the pelvis were performed. Transabdominal technique was performed for global imaging of the pelvis including uterus, ovaries, adnexal regions, and pelvic cul-de-sac. It  was necessary to proceed with endovaginal exam following the transabdominal exam to visualize the uterus and ovaries in greater detail.  COMPARISON:  CT of the abdomen and pelvis performed 06/11/2013  FINDINGS: Uterus  Measurements: 9.4 x 5.7 x 5.9 cm. A single intramural fibroid is noted along the posterior right side of the uterus, measuring 1.6 cm.  Endometrium  Thickness: 0.6 cm. No focal abnormality visualized. The patient's intrauterine device is noted in expected position at the fundus of the uterus.  Right ovary  Measurements: 2.7 x 1.7 x 2.1 cm. Normal appearance/no adnexal mass.  Left ovary  Measurements: 3.4 x 1.9 x 3.1 cm. A partially decompressed cyst is noted at the left ovary, measuring 1.9 cm, corresponding to the patient's site of pain.  Other findings  A small amount of free fluid is noted at the left adnexa and pelvic cul-de-sac.  Somewhat prominent bilateral ovarian vasculature is noted.  IMPRESSION: 1. Partially decompressed cyst at the left ovary, corresponding to the patient's site of pain. Small amount of free fluid at the left adnexa and pelvic cul-de-sac. This may reflect a recently ruptured cyst.  2. No evidence of ovarian torsion. 3. Intrauterine device noted in expected position at the fundus of the uterus. 4. Single intramural fibroid noted along the posterior right side of the uterus, measuring 1.6 cm. 5. Somewhat prominent bilateral ovarian vasculature noted. This could correspond to mild pelvic congestion syndrome in the appropriate clinical situation.   Electronically Signed   By: Garald Balding M.D.   On: 02/08/2014 04:30   US Pelvis Complete  02/08/2014   CLINICAL DATA:  Abdominal pain.  Suprapubic and back pain.  EXAM: TRANSABDOMINAL AND TRANSVAGINAL ULTRASOUND OF PELVIS  TECHNIQUE: Both transabdominal and transvaginal ultrasound examinations of the pelvis were performed. Transabdominal technique was performed for global imaging of the pelvis including uterus, ovaries, adnexal  regions, and pelvic cul-de-sac. It was necessary to proceed with endovaginal exam following the transabdominal exam to visualize the uterus and ovaries in greater detail.  COMPARISON:  CT of the abdomen and pelvis performed 06/11/2013  FINDINGS: Uterus  Measurements: 9.4 x 5.7 x 5.9 cm. A single intramural fibroid is noted along the posterior right side of the uterus, measuring 1.6 cm.  Endometrium  Thickness: 0.6 cm. No focal abnormality visualized. The patient's intrauterine device is noted in expected position at the fundus of the uterus.  Right ovary  Measurements: 2.7 x 1.7 x 2.1 cm. Normal appearance/no adnexal mass.  Left ovary  Measurements: 3.4 x 1.9 x 3.1 cm. A partially decompressed cyst is noted at the left ovary, measuring 1.9 cm, corresponding to the patient's site of pain.  Other findings  A small amount of free fluid is noted at the left adnexa and pelvic cul-de-sac.  Somewhat prominent bilateral ovarian vasculature is noted.  IMPRESSION: 1. Partially decompressed cyst at the left ovary, corresponding to the patient's site of pain. Small amount of free fluid at the left adnexa and pelvic cul-de-sac. This may reflect a recently ruptured cyst. 2. No evidence of ovarian torsion. 3. Intrauterine device noted in expected position at the fundus of the uterus. 4. Single intramural fibroid noted along the posterior right side of the uterus, measuring 1.6 cm. 5. Somewhat prominent bilateral ovarian vasculature noted. This could correspond to mild pelvic congestion syndrome in the appropriate clinical situation.   Electronically Signed   By: Garald Balding M.D.   On: 02/08/2014 04:30     EKG Interpretation None      MDM   Final diagnoses:  Ovarian cyst, left    33 year old female presents to the emergency department for lower abdominal pain associated with nausea. Patient well and nontoxic appearing, hemodynamically stable, and afebrile. Physical exam today significant for suprapubic tenderness  as well as tenderness to palpation in the left lower quadrant. No peritoneal signs, masses, or guarding.   Labs today reviewed which are reassuring. Patient has no leukocytosis, anemia, or electrolyte imbalance. Liver and kidney function preserved. Urinalysis does not suggest infection. Urine pregnancy negative. Given mild left adnexal tenderness on GU exam, pelvic ultrasound ordered for further evaluation of symptoms. Pelvic ultrasound today reveals a decompressed cyst on the left ovary as well as small amount of free fluid in the pelvic cul-de-sac potentially reflecting ruptured cyst.  Imaging findings today consistent with location of patient's symptoms and are likely the cause of her pain today. Pain has been controlled since the patient was given IM morphine. She has not required any additional pain control since this time. Patient stable and appropriate for discharge with prescription for naproxen as well as Norco as needed for breakthrough pain control.  Have advised OB/GYN followup to ensure resolution of symptoms. Return precautions discussed and provided. Patient agreeable to plan with no unaddressed concerns. Patient discharged in good condition; VSS.   Filed Vitals:   02/07/14 2155 02/08/14 0402  BP: 126/87 125/89  Pulse: 84 88  Temp: 98.2 F (36.8 C) 98.9 F (37.2 C)  TempSrc: Oral Oral  Resp: 16 18  SpO2: 100% 100%     Antonietta Breach, PA-C 02/08/14 [phone]

## 2014-02-08 NOTE — ED Notes (Signed)
Pt transported for [place] procedures

## 2014-02-08 NOTE — Discharge Instructions (Signed)
Recommend Naproxen for pain control. You may take Percocet if your pain is not well controlled by naproxen. Followup with your OB/GYN for further evaluation of symptoms. Return to the ED as needed if symptoms worsen.  Ovarian Cyst An ovarian cyst is a fluid-filled sac that forms on an ovary. The ovaries are small organs that produce eggs in women. Various types of cysts can form on the ovaries. Most are not cancerous. Many do not cause problems, and they often go away on their own. Some may cause symptoms and require treatment. Common types of ovarian cysts include:  Functional cysts--These cysts may occur every month during the menstrual cycle. This is normal. The cysts usually go away with the next menstrual cycle if the woman does not get pregnant. Usually, there are no symptoms with a functional cyst.  Endometrioma cysts--These cysts form from the tissue that lines the uterus. They are also called "chocolate cysts" because they become filled with blood that turns brown. This type of cyst can cause pain in the lower abdomen during intercourse and with your menstrual period.  Cystadenoma cysts--This type develops from the cells on the outside of the ovary. These cysts can get very big and cause lower abdomen pain and pain with intercourse. This type of cyst can twist on itself, cut off its blood supply, and cause severe pain. It can also easily rupture and cause a lot of pain.  Dermoid cysts--This type of cyst is sometimes found in both ovaries. These cysts may contain different kinds of body tissue, such as skin, teeth, hair, or cartilage. They usually do not cause symptoms unless they get very big.  Theca lutein cysts--These cysts occur when too much of a certain hormone (human chorionic gonadotropin) is produced and overstimulates the ovaries to produce an egg. This is most common after procedures used to assist with the conception of a baby (in vitro fertilization). CAUSES   Fertility drugs can  cause a condition in which multiple large cysts are formed on the ovaries. This is called ovarian hyperstimulation syndrome.  A condition called polycystic ovary syndrome can cause hormonal imbalances that can lead to nonfunctional ovarian cysts. SIGNS AND SYMPTOMS  Many ovarian cysts do not cause symptoms. If symptoms are present, they may include:  Pelvic pain or pressure.  Pain in the lower abdomen.  Pain during sexual intercourse.  Increasing girth (swelling) of the abdomen.  Abnormal menstrual periods.  Increasing pain with menstrual periods.  Stopping having menstrual periods without being pregnant. DIAGNOSIS  These cysts are commonly found during a routine or annual pelvic exam. Tests may be ordered to find out more about the cyst. These tests may include:  Ultrasound.  X-ray of the pelvis.  CT scan.  MRI.  Blood tests. TREATMENT  Many ovarian cysts go away on their own without treatment. Your health care provider may want to check your cyst regularly for 2-3 months to see if it changes. For women in menopause, it is particularly important to monitor a cyst closely because of the higher rate of ovarian cancer in menopausal women. When treatment is needed, it may include any of the following:  A procedure to drain the cyst (aspiration). This may be done using a long needle and ultrasound. It can also be done through a laparoscopic procedure. This involves using a thin, lighted tube with a tiny camera on the end (laparoscope) inserted through a small incision.  Surgery to remove the whole cyst. This may be done using laparoscopic surgery  or an open surgery involving a larger incision in the lower abdomen.  Hormone treatment or birth control pills. These methods are sometimes used to help dissolve a cyst. HOME CARE INSTRUCTIONS   Only take over-the-counter or prescription medicines as directed by your health care provider.  Follow up with your health care provider as  directed.  Get regular pelvic exams and Pap tests. SEEK MEDICAL CARE IF:   Your periods are late, irregular, or painful, or they stop.  Your pelvic pain or abdominal pain does not go away.  Your abdomen becomes larger or swollen.  You have pressure on your bladder or trouble emptying your bladder completely.  You have pain during sexual intercourse.  You have feelings of fullness, pressure, or discomfort in your stomach.  You lose weight for no apparent reason.  You feel generally ill.  You become constipated.  You lose your appetite.  You develop acne.  You have an increase in body and facial hair.  You are gaining weight, without changing your exercise and eating habits.  You think you are pregnant. SEEK IMMEDIATE MEDICAL CARE IF:   You have increasing abdominal pain.  You feel sick to your stomach (nauseous), and you throw up (vomit).  You develop a fever that comes on suddenly.  You have abdominal pain during a bowel movement.  Your menstrual periods become heavier than usual. MAKE SURE YOU:  Understand these instructions.  Will watch your condition.  Will get help right away if you are not doing well or get worse. Document Released: 05/12/2005 Document Revised: 05/17/2013 Document Reviewed: 01/17/2013 [facility] Patient Information 2015 [place], [place]. This information is not intended to replace advice given to you by your health care provider. Make sure you discuss any questions you have with your health care provider.

## 2014-02-09 LAB — GC/CHLAMYDIA PROBE AMP
CT PROBE, AMP APTIMA: NEGATIVE
GC Probe RNA: NEGATIVE

## 2014-03-27 ENCOUNTER — Encounter (HOSPITAL_COMMUNITY): Payer: Self-pay | Admitting: Emergency Medicine

## 2014-11-09 ENCOUNTER — Ambulatory Visit
Admission: RE | Admit: 2014-11-09 | Discharge: 2014-11-09 | Disposition: A | Discharge status: Home / Self Care | Payer: BLUE CROSS/BLUE SHIELD | Source: Ambulatory Visit | Attending: Family Medicine | Admitting: Family Medicine

## 2014-11-09 ENCOUNTER — Other Ambulatory Visit: Payer: Self-pay | Admitting: Family Medicine

## 2014-11-09 DIAGNOSIS — M79645 Pain in left finger(s): Secondary | ICD-10-CM

## 2014-12-14 ENCOUNTER — Emergency Department (HOSPITAL_COMMUNITY): Payer: BLUE CROSS/BLUE SHIELD

## 2014-12-14 ENCOUNTER — Encounter (HOSPITAL_COMMUNITY): Payer: Self-pay | Admitting: *Deleted

## 2014-12-14 ENCOUNTER — Emergency Department (HOSPITAL_COMMUNITY)
Admission: EM | Admit: 2014-12-14 | Discharge: 2014-12-15 | Disposition: A | Discharge status: Home / Self Care | Payer: BLUE CROSS/BLUE SHIELD | Attending: Emergency Medicine | Admitting: Emergency Medicine

## 2014-12-14 DIAGNOSIS — M545 Low back pain: Secondary | ICD-10-CM | POA: Insufficient documentation

## 2014-12-14 DIAGNOSIS — Z87891 Personal history of nicotine dependence: Secondary | ICD-10-CM | POA: Diagnosis not present

## 2014-12-14 DIAGNOSIS — Z8614 Personal history of Methicillin resistant Staphylococcus aureus infection: Secondary | ICD-10-CM | POA: Diagnosis not present

## 2014-12-14 DIAGNOSIS — M549 Dorsalgia, unspecified: Secondary | ICD-10-CM

## 2014-12-14 DIAGNOSIS — Z3202 Encounter for pregnancy test, result negative: Secondary | ICD-10-CM | POA: Insufficient documentation

## 2014-12-14 DIAGNOSIS — Z87442 Personal history of urinary calculi: Secondary | ICD-10-CM | POA: Insufficient documentation

## 2014-12-14 DIAGNOSIS — R1032 Left lower quadrant pain: Secondary | ICD-10-CM | POA: Diagnosis present

## 2014-12-14 DIAGNOSIS — N72 Inflammatory disease of cervix uteri: Secondary | ICD-10-CM | POA: Diagnosis not present

## 2014-12-14 DIAGNOSIS — Z880 Allergy status to penicillin: Secondary | ICD-10-CM | POA: Diagnosis not present

## 2014-12-14 DIAGNOSIS — R102 Pelvic and perineal pain: Secondary | ICD-10-CM

## 2014-12-14 DIAGNOSIS — F329 Major depressive disorder, single episode, unspecified: Secondary | ICD-10-CM | POA: Diagnosis not present

## 2014-12-14 DIAGNOSIS — N760 Acute vaginitis: Secondary | ICD-10-CM

## 2014-12-14 DIAGNOSIS — B9689 Other specified bacterial agents as the cause of diseases classified elsewhere: Secondary | ICD-10-CM

## 2014-12-14 DIAGNOSIS — Z79899 Other long term (current) drug therapy: Secondary | ICD-10-CM | POA: Insufficient documentation

## 2014-12-14 HISTORY — DX: Unspecified ovarian cyst, unspecified side: N83.209

## 2014-12-14 LAB — CBC
HEMATOCRIT: 40.1 % (ref 36.0–46.0)
Hemoglobin: 13 g/dL (ref 12.0–15.0)
MCH: 29 pg (ref 26.0–34.0)
MCHC: 32.4 g/dL (ref 30.0–36.0)
MCV: 89.3 fL (ref 78.0–100.0)
Platelets: 359 10*3/uL (ref 150–400)
RBC: 4.49 MIL/uL (ref 3.87–5.11)
RDW: 13.4 % (ref 11.5–15.5)
WBC: 15.4 10*3/uL — AB (ref 4.0–10.5)

## 2014-12-14 LAB — I-STAT BETA HCG BLOOD, ED (MC, WL, AP ONLY): I-stat hCG, quantitative: <5 m[IU]/mL (ref <5)

## 2014-12-14 LAB — COMPREHENSIVE METABOLIC PANEL
ALBUMIN: 4.2 g/dL (ref 3.5–5.0)
ALK PHOS: 67 U/L (ref 38–126)
ALT: 14 U/L (ref 14–54)
AST: 19 U/L (ref 15–41)
Anion gap: 8 (ref 5–15)
BUN: 12 mg/dL (ref 6–20)
CO2: 24 mmol/L (ref 22–32)
Calcium: 9.1 mg/dL (ref 8.9–10.3)
Chloride: 106 mmol/L (ref 101–111)
Creatinine, Ser: 0.85 mg/dL (ref 0.44–1.00)
GFR calc Af Amer: >60 mL/min (ref >60)
GLUCOSE: 100 mg/dL — AB (ref 65–99)
POTASSIUM: 3.7 mmol/L (ref 3.5–5.1)
Sodium: 138 mmol/L (ref 135–145)
Total Bilirubin: 0.4 mg/dL (ref 0.3–1.2)
Total Protein: 8.2 g/dL — ABNORMAL HIGH (ref 6.5–8.1)

## 2014-12-14 LAB — URINALYSIS, ROUTINE W REFLEX MICROSCOPIC
Bilirubin Urine: NEGATIVE
Glucose, UA: NEGATIVE mg/dL
HGB URINE DIPSTICK: NEGATIVE
Ketones, ur: NEGATIVE mg/dL
LEUKOCYTES UA: NEGATIVE
NITRITE: NEGATIVE
Protein, ur: NEGATIVE mg/dL
Specific Gravity, Urine: 1.034 — ABNORMAL HIGH (ref 1.005–1.030)
Urobilinogen, UA: 0.2 mg/dL (ref 0.0–1.0)
pH: 5.5 (ref 5.0–8.0)

## 2014-12-14 LAB — WET PREP, GENITAL
Trich, Wet Prep: NONE SEEN
Yeast Wet Prep HPF POC: NONE SEEN

## 2014-12-14 LAB — LIPASE, BLOOD: LIPASE: 19 U/L — AB (ref 22–51)

## 2014-12-14 MED ORDER — HYDROCODONE-ACETAMINOPHEN 5-325 MG PO TABS: ORAL_TABLET | ORAL | Status: DC

## 2014-12-14 MED ORDER — AZITHROMYCIN 250 MG PO TABS
1000.0000 mg | ORAL_TABLET | Freq: Once | ORAL | Status: AC
  Administered 2014-12-14: 1000 mg via ORAL
  Filled 2014-12-14: qty 4

## 2014-12-14 MED ORDER — METRONIDAZOLE 500 MG PO TABS: 500.0000 mg | ORAL_TABLET | Freq: Two times a day (BID) | ORAL | Status: DC

## 2014-12-14 MED ORDER — KETOROLAC TROMETHAMINE 60 MG/2ML IM SOLN
60.0000 mg | Freq: Once | INTRAMUSCULAR | Status: AC
  Administered 2014-12-14: 60 mg via INTRAMUSCULAR
  Filled 2014-12-14: qty 2

## 2014-12-14 MED ORDER — NAPROXEN 250 MG PO TABS: 250.0000 mg | ORAL_TABLET | Freq: Two times a day (BID) | ORAL | Status: DC | PRN

## 2014-12-14 MED ORDER — LIDOCAINE HCL (PF) 1 % IJ SOLN
5.0000 mL | Freq: Once | INTRAMUSCULAR | Status: AC
  Administered 2014-12-14: 5 mL via INTRADERMAL
  Filled 2014-12-14: qty 5

## 2014-12-14 MED ORDER — CEFTRIAXONE SODIUM 250 MG IJ SOLR
250.0000 mg | Freq: Once | INTRAMUSCULAR | Status: AC
  Administered 2014-12-14: 250 mg via INTRAMUSCULAR
  Filled 2014-12-14: qty 250

## 2014-12-14 MED ORDER — METRONIDAZOLE 500 MG PO TABS
500.0000 mg | ORAL_TABLET | Freq: Once | ORAL | Status: AC
  Administered 2014-12-14: 500 mg via ORAL
  Filled 2014-12-14: qty 1

## 2014-12-14 NOTE — ED Notes (Signed)
Patient transported to CT 

## 2014-12-14 NOTE — Discharge Instructions (Signed)
°Emergency Department Resource Guide °1) Find a Doctor and Pay Out of Pocket °Although you won't have to find out who is covered by your insurance plan, it is a good idea to ask around and get recommendations. You will then need to call the office and see if the doctor you have chosen will accept you as a new patient and what types of options they offer for patients who are self-pay. Some doctors offer discounts or will set up payment plans for their patients who do not have insurance, but you will need to ask so you aren't surprised when you get to your appointment. ° °2) Contact Your Local Health Department °Not all health departments have doctors that can see patients for sick visits, but many do, so it is worth a call to see if yours does. If you don't know where your local health department is, you can check in your phone book. The CDC also has a tool to help you locate your state's health department, and many state websites also have listings of all of their local health departments. ° °3) Find a Walk-in Clinic °If your illness is not likely to be very severe or complicated, you may want to try a walk in clinic. These are popping up all over the country in pharmacies, drugstores, and shopping centers. They're usually staffed by nurse practitioners or physician assistants that have been trained to treat common illnesses and complaints. They're usually fairly quick and inexpensive. However, if you have serious medical issues or chronic medical problems, these are probably not your best option. ° °No Primary Care Doctor: °- Call [facility] at  832-8000 - they can help you locate a primary care doctor that  accepts your insurance, provides certain services, etc. °- Physician Referral Service- 1-800-533-3463 ° °Chronic Pain Problems: °Organization         Address  Phone   Notes  °[facility]  (336) 297-2271 Patients need to be referred by their primary care doctor.  ° °Medication  Assistance: °Organization         Address  Phone   Notes  °[place] [facility] [address] °[place], [place] [address] (336) 641-8030 --Must be a resident of [place] °-- Must have NO insurance coverage whatsoever (no Medicaid/ Medicare, etc.) °-- The pt. MUST have a primary care doctor that directs their care regularly and follows them in the community °  °[facility]  (866) 331-1348   °[employer]  (888) 892-1162   ° °Agencies that provide inexpensive medical care: °Organization         Address  Phone   Notes  °[facility]  (336) 832-8035   °[facility]    (336) 832-7272   °[facility] [address] °[place], [place] [address] (336) 832-4777   °[facility] [address], °[place] (336) 271-4999   °[facility]    (336) 373-0678   °[facility]    (336) 272-1050   °[facility] ° [address], [place] Phone:  (336) 832-4444, Fax:  (336) 832-4440 Hours of Operation:  9 am - 6 pm, M-F.  Also accepts Medicaid/Medicare and self-pay.  °[facility] ° [address], [place] Phone: (336) [phone], Fax: (336) [phone]. Hours of Operation:  8:30 am - 5:30 pm, M-F.  Also accepts Medicaid and self-pay.  °[facility] [place] [address], [place] Phone: (336) 878-6027   °[facility] [address], [place], [place] (336)723-1848, Ext. 123 Mondays & Thursdays: 7-9 AM.  First 15 patients are seen on a first come, first serve basis. °  ° °Medicaid-accepting [place] Providers: ° °Organization         Address  Phone   Notes  °[facility] [address], [place] (336) 641-2100 Also accepts self-pay patients.  °[facility] [address], [place] ° (336) 856-9996   °[facility] [address], [place]  (336) 288-8857   °[facility] [address] [place] [address], [place] (336) 299-7000   °Veita Bland [address], [place]  ° (336) 373-1557 Only accepts [place] Access Medicaid patients after they have their name applied to their card.  ° °Self-Pay (no insurance) in [place]: ° °Organization         Address  Phone   Notes  °Sickle Cell Patients, [facility] [address], [place] (336) 832-1970   °[facility] [address], [place] (336) 832-4400   °[facility] ° [address], [place] (336) 992-4800   °[facility]/Dr. Osei-Bonsu ° [address] [place] [address], [place] or [address], [place] (336) [phone] Phone number for both [place] and [place] locations is the same.  °[facility] [address], [place] (336) 299-0000   °[facility] [address] [place] [address], [place] or [address] (336) 852-7530 °(336) 878-2260   °[facility] [address], [place] (336) 350-1642, phone; (336) 294-5005, fax Sees patients 1st and 3rd Saturday of every month.  Must not qualify for public or private insurance (i.e. Medicaid, Medicare, [place] Health Choice, Veterans' Benefits) • Household income should be no more than 200% of the poverty level •The clinic cannot treat you if you are pregnant or think you are pregnant • Sexually transmitted diseases are not treated at the clinic.  ° ° °Dental Care: °Organization         Address  Phone  Notes  °[place] [facility] [address], [place] (336) 641-6152 Accepts children up to age 21 who are enrolled in Medicaid or [place] Health Choice; pregnant women with a Medicaid card; and children who have applied for Medicaid or [place] Health Choice, but were declined, whose parents can pay a reduced fee at time of service.  °[place]  [facility] [place]  [address], [place] (336) 641-7733 Accepts children up to age 21 who are enrolled in Medicaid or [place] Health Choice; pregnant women with a Medicaid card; and children who have applied for Medicaid or  Health Choice, but were declined, whose parents can pay a reduced fee at time of service.  °[facility] ° [address], [place] (336) 641-4533 Patients are seen by appointment only. Walk-ins are not accepted. [facility] will see patients 18 years of age and older. °Monday - Tuesday (8am-5pm) °Most Wednesdays (8:30-5pm) °$30 per visit, cash only  °[facility] ° [address], [place] (336) [phone] Patients are seen by appointment only. Walk-ins are not accepted. [facility] will see patients 18 years of age and older. °One   Wednesday Evening (Monthly: Volunteer Based).  $30 per visit, cash only  °[facility]  (919) 537-3737 for adults; Children under age 4, call Graduate Pediatric Dentistry at ([phone]) [phone]. Children aged 4-14, please call ([phone]) [phone] to request a pediatric application. ° Dental services are provided in all areas of dental care including fillings, crowns and bridges, complete and partial dentures, implants, gum treatment, root canals, and extractions. Preventive care is also provided. Treatment is provided to both adults and children. °Patients are selected via a lottery and there is often a waiting list. °  °[facility] [address], °[place] ° (336) 763-8833 [email or web address] °  °[facility] [address], [place], [place] (336)723-1848, Ext. 123 Second and Fourth Thursday of each month, opens at 6:30 AM; Clinic ends at 9 AM.  Patients are seen on a first-come first-served basis, and a limited number are seen during each clinic.  ° °[facility] ° [address], [place], [place] (336) [phone]    Eligibility Requirements °You must have lived in [place], or [place] counties for at least the last three months. °  You cannot be eligible for state or federal sponsored healthcare insurance, including Veterans Administration, Medicaid, or Medicare. °  You generally cannot be eligible for healthcare insurance through your employer.  °  How to apply: °Eligibility screenings are held every Tuesday and Wednesday afternoon from 1:00 pm until 4:00 pm. You do not need an appointment for the interview!  °[facility] [address], [place], [place] 336-631-2330   °[facility]  336-342-8273   °[facility]  336-703-3100   °[facility]  336-570-6415   ° °Behavioral Health Resources in the Community: °Intensive Outpatient Programs °Organization         Address  Phone  Notes  °[facility] [address], [place], [place] 336-878-6098   °[facility] [address], [address] [place], [place] 336-832-9800   °[facility] [address], [place], [place] ° 336-882-2125   °[facility] [address],  °[place], [place] 1-800-853-5163 or 336-641-4981   °Substance Abuse Resources °Organization         Address  Phone  Notes  °[facility]  336-882-2125   °[facility]  336-784-9470   °[facility]  336-285-9073   °[facility]  336-845-3988   °Residential & Outpatient Substance Abuse Program  1-800-659-3381   °Psychological Services °Organization         Address  Phone  Notes  °[facility]  336- 832-9600   °[facility]  336- 378-7881   °[facility] [address], [place] 1-800-853-5163 or 336-641-4981   ° °Mobile Crisis Teams °Organization         Address  Phone  Notes  °[facility], Mobile Crisis Care Unit  1-877-626-1772   °Assertive °[facility] ° [address]  [place], [place] 336-834-9664   °Sharon DeEsch [address] °[place] 336-554-5454   ° °Self-Help/Support Groups °Organization         Address  Phone             Notes  °[facility] [place] [facility] - variety of support groups  336- 373-1402 Call for more information  °Narcotics Anonymous (NA), [facility] [address], °[place]  2 meetings at this location  ° °  Residential Treatment Programs Organization         Address  Phone  Notes  [facility] [address],    [place]  1-(705)088-8802   [facility]  [address], [place] [address], [place], [place]   [place], [place] [phone] Admissions: 8am-3pm M-F  [facility] [address]    [place], [place] [phone]   [facility] [address] [place], [place], [place]   [facility] [address],  [place], [place]   [facility] Intensive Outpatient [address], [place], [place]   [facility] [place] [address]  [place], [place] 1-(782)429-0021 or 614-103-8895   [facility] [address] [place], [place] Accepts Medicaid  [facility] [address],  [place] [phone] Substance Abuse/Addiction Treatment   [facility]         Address  Phone  Notes  [facility]  (224)272-4224   Domenic Schwab, PhD [address] Arlis Porta [place], [place]   847-802-1721 or 6362002160   Kelayres Moore [place] [place], [place] [phone]   [facility] [address], [place], [place] [phone] Insurance/Medicaid/sponsorship through [facility] [address]                                    [place], [place] [phone] [place] [address], [place] [phone]    Dr. Adele Schilder  [phone]   [facility] Dept. 1) [address], [place] 2) [place] 3)  [place] [address], [place] [phone]  [phone]   [place] [phone] or [phone] (After Hours)      Take the prescription as directed.  Your gonorrhea and chlamydia culture is pending results, and you will receive a phone call in the next several days if it is positive.  However, you were treated empirically today with antibiotics for both gonorrhea and chlamydia.  Apply moist heat to the area(s) of discomfort, for 15 minutes at a time, several times per day for the next few days.  Do not fall asleep on a heating pack.  Call your regular OB/GYN doctor tomorrow to schedule a follow up appointment within the next week.  Return to the Emergency Department immediately if worsening.

## 2014-12-14 NOTE — ED Provider Notes (Signed)
CSN: [id number]     Arrival date & time 12/14/14  1906 History   First MD Initiated Contact with Patient 12/14/14 2033     Chief Complaint  Patient presents with  . Abdominal Pain     HPI Pt was seen at 2040.  Per pt, c/o gradual onset and persistence of constant lower left abd/pelvic "pain" since this morning.  Has been associated with nausea.  Describes the abd pain as radiating into her lower back. States she was evaluated by Sadie Haber MD PTA, then sent to the ED "to make sure I didn't have a kidney stone." Pt states she has hx of kidney stones and ovarian cysts, and "isn't sure which one this feels like." Denies vomiting/diarrhea, no fevers, no back pain, no rash, no CP/SOB, no black or blood in stools, no vaginal bleeding/discharge, no dysuria/hematuria.       Past Medical History  Diagnosis Date  . MRSA infection   . Kidney stones 2009  . Abnormal Pap smear     Colposcopy normal  . Depression     Not on current treatment  . Ovarian cyst    Past Surgical History  Procedure Laterality Date  . Colposcopy    . Cholecystectomy     Family History  Problem Relation Age of Onset  . COPD Mother   . Hypertension Mother   . Heart disease Mother   . Hyperlipidemia Sister   . Hypertension Sister   . Depression Brother   . Drug abuse Brother   . Cancer Paternal Aunt   . Cancer Paternal Uncle    History  Substance Use Topics  . Smoking status: Former Smoker -- 1.00 packs/day    Types: Cigarettes    Quit date: 09/04/2008  . Smokeless tobacco: Never Used  . Alcohol Use: Yes   OB History    Gravida Para Term Preterm AB TAB SAB Ectopic Multiple Living   4 4 4       4      Review of Systems ROS: Statement: All systems negative except as marked or noted in the HPI; Constitutional: Negative for fever and chills. ; ; Eyes: Negative for eye pain, redness and discharge. ; ; ENMT: Negative for ear pain, hoarseness, nasal congestion, sinus pressure and sore throat. ; ; Cardiovascular:  Negative for chest pain, palpitations, diaphoresis, dyspnea and peripheral edema. ; ; Respiratory: Negative for cough, wheezing and stridor. ; ; Gastrointestinal: +nausea, abd pain. Negative for vomiting, diarrhea, blood in stool, hematemesis, jaundice and rectal bleeding. . ; ; Genitourinary: +pelvic pain. Negative for dysuria, flank pain and hematuria. ; ; GYN:  No vaginal bleeding, no vaginal discharge, no vulvar pain. ;; Musculoskeletal: +LBP. Negative for neck pain. Negative for swelling and trauma.; ; Skin: Negative for pruritus, rash, abrasions, blisters, bruising and skin lesion.; ; Neuro: Negative for headache, lightheadedness and neck stiffness. Negative for weakness, altered level of consciousness , altered mental status, extremity weakness, paresthesias, involuntary movement, seizure and syncope.      Allergies  Penicillins  Home Medications   Prior to Admission medications   Medication Sig Start Date End Date Taking? Authorizing Provider  buPROPion (WELLBUTRIN XL) 300 MG 24 hr tablet Take 1 tablet by mouth daily. 01/08/14  Yes Historical Provider, MD  levonorgestrel (MIRENA) 20 MCG/24HR IUD 1 each by Intrauterine route once.   Yes Historical Provider, MD  naproxen sodium (ANAPROX) 220 MG tablet Take 220 mg by mouth 2 (two) times daily as needed (pain).   Yes Historical Provider,  MD  phentermine (ADIPEX-P) 37.5 MG tablet Take 37.5 mg by mouth daily. 11/22/14  Yes Historical Provider, MD  naproxen (NAPROSYN) 500 MG tablet Take 1 tablet (500 mg total) by mouth 2 (two) times daily. Patient not taking: Reported on 12/14/2014 02/08/14   Antonietta Breach, PA-C  oxyCODONE-acetaminophen (PERCOCET/ROXICET) 5-325 MG per tablet Take 1-2 tablets by mouth every 6 (six) hours as needed for moderate pain or severe pain. Patient not taking: Reported on 12/14/2014 02/08/14   Antonietta Breach, PA-C   BP 125/86 mmHg  Pulse 92  Temp(Src) 98.4 F (36.9 C) (Oral)  Resp 18  SpO2 100% Physical Exam  2045: Physical  examination:  Nursing notes reviewed; Vital signs and O2 SAT reviewed;  Constitutional: Well developed, Well nourished, Well hydrated, In no acute distress; Head:  Normocephalic, atraumatic; Eyes: EOMI, PERRL, No scleral icterus; ENMT: Mouth and pharynx normal, Mucous membranes moist; Neck: Supple, Full range of motion, No lymphadenopathy; Cardiovascular: Regular rate and rhythm, No murmur, rub, or gallop; Respiratory: Breath sounds clear & equal bilaterally, No rales, rhonchi, wheezes.  Speaking full sentences with ease, Normal respiratory effort/excursion; Chest: Nontender, Movement normal; Abdomen: Soft, +LLQ and suprapubic tenderness to palp. No rebound or guarding. Nondistended, Normal bowel sounds; Genitourinary: No CVA tenderness. Pelvic exam performed with permission of pt and female ED tech assist during exam.  External genitalia w/o lesions. Vaginal vault with thick white discharge.  Cervix w/o lesions, not friable, GC/chlam and wet prep obtained and sent to lab.  Bimanual exam w/o CMT, or right pelvic tenderness. +uterine and left pelvic tenderness.; Spine:  No midline CS, TS, LS tenderness.;; Extremities: Pulses normal, No tenderness, No edema, No calf edema or asymmetry.; Neuro: AA&Ox3, Major CN grossly intact.  Speech clear. No gross focal motor or sensory deficits in extremities.; Skin: Color normal, Warm, Dry.   ED Course  Procedures     EKG Interpretation None      MDM  MDM Reviewed: previous chart, nursing note and vitals Reviewed previous: labs Interpretation: CT scan, x-ray and labs     Results for orders placed or performed during the hospital encounter of 12/14/14  Wet prep, genital  Result Value Ref Range   Yeast Wet Prep HPF POC NONE SEEN NONE SEEN   Trich, Wet Prep NONE SEEN NONE SEEN   Clue Cells Wet Prep HPF POC MODERATE (A) NONE SEEN   WBC, Wet Prep HPF POC MODERATE (A) NONE SEEN  Lipase, blood  Result Value Ref Range   Lipase 19 (L) 22 - 51 U/L   Comprehensive metabolic panel  Result Value Ref Range   Sodium 138 135 - 145 mmol/L   Potassium 3.7 3.5 - 5.1 mmol/L   Chloride 106 101 - 111 mmol/L   CO2 24 22 - 32 mmol/L   Glucose, Bld 100 (H) 65 - 99 mg/dL   BUN 12 6 - 20 mg/dL   Creatinine, Ser 0.85 0.44 - 1.00 mg/dL   Calcium 9.1 8.9 - 10.3 mg/dL   Total Protein 8.2 (H) 6.5 - 8.1 g/dL   Albumin 4.2 3.5 - 5.0 g/dL   AST 19 15 - 41 U/L   ALT 14 14 - 54 U/L   Alkaline Phosphatase 67 38 - 126 U/L   Total Bilirubin 0.4 0.3 - 1.2 mg/dL   GFR calc non Af Amer >60 >60 mL/min   GFR calc Af Amer >60 >60 mL/min   Anion gap 8 5 - 15  CBC  Result Value Ref Range   WBC  15.4 (H) 4.0 - 10.5 K/uL   RBC 4.49 3.87 - 5.11 MIL/uL   Hemoglobin 13.0 12.0 - 15.0 g/dL   HCT 40.1 36.0 - 46.0 %   MCV 89.3 78.0 - 100.0 fL   MCH 29.0 26.0 - 34.0 pg   MCHC 32.4 30.0 - 36.0 g/dL   RDW 13.4 11.5 - 15.5 %   Platelets 359 150 - 400 K/uL  Urinalysis, Routine w reflex microscopic (not at [facility])  Result Value Ref Range   Color, Urine YELLOW YELLOW   APPearance CLOUDY (A) CLEAR   Specific Gravity, Urine 1.034 (H) 1.005 - 1.030   pH 5.5 5.0 - 8.0   Glucose, UA NEGATIVE NEGATIVE mg/dL   Hgb urine dipstick NEGATIVE NEGATIVE   Bilirubin Urine NEGATIVE NEGATIVE   Ketones, ur NEGATIVE NEGATIVE mg/dL   Protein, ur NEGATIVE NEGATIVE mg/dL   Urobilinogen, UA 0.2 0.0 - 1.0 mg/dL   Nitrite NEGATIVE NEGATIVE   Leukocytes, UA NEGATIVE NEGATIVE  I-Stat beta hCG blood, ED ([facility], [facility], [facility] only)  Result Value Ref Range   I-stat hCG, quantitative <5.0 <5 mIU/mL   Comment 3            [place] Art/ven Flow Abd Pelv Doppler 12/14/2014   CLINICAL DATA:  Acute onset of left-sided pelvic pain and left back pain. Initial encounter.  EXAM: TRANSABDOMINAL AND TRANSVAGINAL ULTRASOUND OF PELVIS  DOPPLER ULTRASOUND OF OVARIES  TECHNIQUE: Both transabdominal and transvaginal ultrasound examinations of the pelvis were performed. Transabdominal technique was performed for global imaging  of the pelvis including uterus, ovaries, adnexal regions, and pelvic cul-de-sac.  It was necessary to proceed with endovaginal exam following the transabdominal exam to visualize the uterus and ovaries in greater detail. Color and duplex Doppler ultrasound was utilized to evaluate blood flow to the ovaries.  COMPARISON:  None.  FINDINGS: Uterus  Measurements: 8.6 x 5.3 x 5.3 cm. A 2.2 x 1.9 x 1.5 cm myometrial fibroid is noted along the posterior aspect of the uterus. The patient's intrauterine device is noted in expected position, near the fundus of the uterus.  Endometrium  Not well assessed given the patient's intrauterine device.  Right ovary  Measurements: 2.3 x 1.7 x 1.8 cm. Normal appearance/no adnexal mass.  Left ovary  Measurements: 2.9 x 2.0 x 2.0 cm. Normal appearance/no adnexal mass.  Pulsed Doppler evaluation of both ovaries demonstrates normal low-resistance arterial and venous waveforms.  Other findings  No free fluid is seen within the pelvic cul-de-sac.  IMPRESSION: 1. No acute abnormality seen within the pelvis. No evidence for ovarian torsion. 2. 2.2 cm posterior myometrial fibroid noted. 3. Intrauterine device noted in expected position, near the fundus of the uterus.   Electronically Signed   By: Garald Balding M.D.   On: 12/14/2014 23:00   Ct Renal Stone Study 12/14/2014   CLINICAL DATA:  Left lower abdominal pain, possible kidney stone  EXAM: CT ABDOMEN AND PELVIS WITHOUT CONTRAST  TECHNIQUE: Multidetector CT imaging of the abdomen and pelvis was performed following the standard protocol without IV contrast.  COMPARISON:  06/12/2013  FINDINGS: Lung bases are unremarkable. Sagittal images of the spine are unremarkable. Unenhanced liver shows no biliary ductal dilatation. Status postcholecystectomy. Unenhanced pancreas, spleen and adrenal glands are unremarkable. Punctate nonobstructive calcifications are noted within right kidney the largest in midpole measures 2 mm. There is no  hydronephrosis or hydroureter. No calcified ureteral calculi.  No small bowel obstruction. No ascites or free air. No adenopathy. No pericecal inflammation. Normal appendix  noted in axial image 64.  There is IUD within anteflexed uterus. Urinary bladder is empty. Pelvic phleboliths are noted.  IMPRESSION: 1. There is right nonobstructive nephrolithiasis. No hydronephrosis or hydroureter. 2. No calcified ureteral calculi are noted. 3. No small bowel obstruction. 4. IUD within anteflexed uterus. 5. Normal appendix.  No pericecal inflammation.   Electronically Signed   By: Lahoma Crocker M.D.   On: 12/14/2014 21:09    2330:  Labs reassuring. CT and [place] without acute cause for pt's pain. Will tx for BV and STD. Pt has gotten herself dressed and is ready to go home now. Dx and testing d/w pt.  Questions answered.  Verb understanding, agreeable to d/c home with outpt f/u.    Francine Graven, DO 12/17/14 [phone]

## 2014-12-14 NOTE — ED Notes (Signed)
Pt being sent from [facility] walk in clinic to rule out possible kidney stone, left lower abd pain 8/10, pain radiates to back.

## 2014-12-14 NOTE — ED Notes (Signed)
Pt reports lower abd pain with nausea and low back pain since early this am.  Denies dysuria or hematuria at this time.

## 2014-12-15 LAB — GC/CHLAMYDIA PROBE AMP (~~LOC~~) NOT AT ARMC
Chlamydia: NEGATIVE
Neisseria Gonorrhea: NEGATIVE

## 2016-01-03 ENCOUNTER — Other Ambulatory Visit: Payer: Self-pay | Admitting: Family Medicine

## 2016-01-03 DIAGNOSIS — N200 Calculus of kidney: Secondary | ICD-10-CM

## 2016-01-04 ENCOUNTER — Ambulatory Visit
Admission: RE | Admit: 2016-01-04 | Discharge: 2016-01-04 | Disposition: A | Discharge status: Home / Self Care | Payer: BLUE CROSS/BLUE SHIELD | Source: Ambulatory Visit | Attending: Family Medicine | Admitting: Family Medicine

## 2016-01-04 DIAGNOSIS — N200 Calculus of kidney: Secondary | ICD-10-CM

## 2016-01-12 ENCOUNTER — Encounter (HOSPITAL_BASED_OUTPATIENT_CLINIC_OR_DEPARTMENT_OTHER): Payer: Self-pay | Admitting: *Deleted

## 2016-01-12 DIAGNOSIS — K5900 Constipation, unspecified: Secondary | ICD-10-CM | POA: Insufficient documentation

## 2016-01-12 DIAGNOSIS — Z87891 Personal history of nicotine dependence: Secondary | ICD-10-CM | POA: Insufficient documentation

## 2016-01-12 LAB — URINALYSIS, ROUTINE W REFLEX MICROSCOPIC
BILIRUBIN URINE: NEGATIVE
Glucose, UA: NEGATIVE mg/dL
Hgb urine dipstick: NEGATIVE
Ketones, ur: NEGATIVE mg/dL
Leukocytes, UA: NEGATIVE
NITRITE: NEGATIVE
Protein, ur: NEGATIVE mg/dL
SPECIFIC GRAVITY, URINE: 1.025 (ref 1.005–1.030)
pH: 6 (ref 5.0–8.0)

## 2016-01-12 LAB — PREGNANCY, URINE: Preg Test, Ur: NEGATIVE

## 2016-01-12 NOTE — ED Triage Notes (Signed)
Abd pain x 2 weeks. Saw PCP. Told she might have kidney stones. Had CT which showed the same. Referred to urologist, but they told her this pain was not from the stones. Pt tried to call PCP, but no answer. Urologist told her to come to ED.

## 2016-01-13 ENCOUNTER — Emergency Department (HOSPITAL_BASED_OUTPATIENT_CLINIC_OR_DEPARTMENT_OTHER)
Admission: EM | Admit: 2016-01-13 | Discharge: 2016-01-13 | Disposition: A | Discharge status: Home / Self Care | Payer: BLUE CROSS/BLUE SHIELD | Attending: Emergency Medicine | Admitting: Emergency Medicine

## 2016-01-13 ENCOUNTER — Emergency Department (HOSPITAL_BASED_OUTPATIENT_CLINIC_OR_DEPARTMENT_OTHER): Payer: BLUE CROSS/BLUE SHIELD

## 2016-01-13 ENCOUNTER — Encounter (HOSPITAL_BASED_OUTPATIENT_CLINIC_OR_DEPARTMENT_OTHER): Payer: Self-pay | Admitting: Emergency Medicine

## 2016-01-13 DIAGNOSIS — R109 Unspecified abdominal pain: Secondary | ICD-10-CM

## 2016-01-13 DIAGNOSIS — K5900 Constipation, unspecified: Secondary | ICD-10-CM

## 2016-01-13 LAB — WET PREP, GENITAL
CLUE CELLS WET PREP: NONE SEEN
Sperm: NONE SEEN
Trich, Wet Prep: NONE SEEN
YEAST WET PREP: NONE SEEN

## 2016-01-13 MED ORDER — KETOROLAC TROMETHAMINE 60 MG/2ML IM SOLN
60.0000 mg | Freq: Once | INTRAMUSCULAR | Status: AC
  Administered 2016-01-13: 60 mg via INTRAMUSCULAR
  Filled 2016-01-13: qty 2

## 2016-01-13 MED ORDER — POLYETHYLENE GLYCOL 3350 17 GM/SCOOP PO POWD: 17.0000 g | Freq: Every day | ORAL | 0 refills | Status: DC

## 2016-01-13 MED ORDER — HYOSCYAMINE SULFATE 0.125 MG SL SUBL: 0.1250 mg | SUBLINGUAL_TABLET | SUBLINGUAL | 0 refills | Status: DC | PRN

## 2016-01-13 MED ORDER — DICYCLOMINE HCL 10 MG/ML IM SOLN
20.0000 mg | Freq: Once | INTRAMUSCULAR | Status: AC
  Administered 2016-01-13: 20 mg via INTRAMUSCULAR
  Filled 2016-01-13: qty 2

## 2016-01-13 MED ORDER — GI COCKTAIL ~~LOC~~
30.0000 mL | Freq: Once | ORAL | Status: AC
  Administered 2016-01-13: 30 mL via ORAL
  Filled 2016-01-13: qty 30

## 2016-01-13 NOTE — ED Notes (Signed)
MD with pt  

## 2016-01-13 NOTE — ED Notes (Signed)
C/O abd cramping at times and at other times sharp. +nausea. Denies vaginal d/c or problems with urination.

## 2016-01-13 NOTE — ED Provider Notes (Signed)
[place] [facility] Provider Note   CSN: [id number] Arrival date & time: 01/12/16  2101     History   Chief Complaint Chief Complaint  Patient presents with  . Abdominal Pain    HPI Katherine Fritz is a 35 y.o. female.  The history is provided by the patient.  Abdominal Pain   This is a recurrent problem. The current episode started more than 1 week ago (more than 2 weeks ago but had this pain the same time last summer). The problem occurs constantly. The problem has not changed since onset.The pain is associated with an unknown factor. The quality of the pain is cramping (pelvic region B). The pain is severe. Pertinent negatives include anorexia, fever, vomiting, dysuria, hematuria and myalgias. Nothing aggravates the symptoms. Nothing relieves the symptoms. Past workup includes CT scan. Past workup does not include GI consult. Past workup comments: urology appointment. Her past medical history does not include Crohn's disease.    Past Medical History:  Diagnosis Date  . Abnormal Pap smear    Colposcopy normal  . Depression    Not on current treatment  . Kidney stones 2009  . MRSA infection   . Ovarian cyst     Patient Active Problem List   Diagnosis Date Noted  . Biliary colic [id number]  . General counseling and advice on female contraception 03/14/2013  . Tinea pedis 11/08/2010  . MOOD SWINGS 09/03/2009  . ABNORMAL PAP SMEAR, LGSIL 08/09/2009  . OBESITY 07/11/2009  . RH FACTOR, NEGATIVE 05/12/2008  . ANEMIA, IRON DEFICIENCY, HX OF 04/07/2007  . DEPRESSIVE DISORDER, NOS 07/23/2006    Past Surgical History:  Procedure Laterality Date  . CHOLECYSTECTOMY    . COLPOSCOPY      OB History    Gravida Para Term Preterm AB Living   4 4 4     4    SAB TAB Ectopic Multiple Live Births           4       Home Medications    Prior to Admission medications   Medication Sig Start Date End Date Taking? Authorizing Provider  buPROPion (WELLBUTRIN XL) 300 MG 24  hr tablet Take 1 tablet by mouth daily. 01/08/14   Historical Provider, MD  HYDROcodone-acetaminophen (NORCO/VICODIN) 5-325 MG per tablet 1 or 2 tabs PO q6 hours prn pain 12/14/14   Francine Graven, DO  levonorgestrel (MIRENA) 20 MCG/24HR IUD 1 each by Intrauterine route once.    Historical Provider, MD  metroNIDAZOLE (FLAGYL) 500 MG tablet Take 1 tablet (500 mg total) by mouth 2 (two) times daily. 12/14/14   Francine Graven, DO  naproxen (NAPROSYN) 250 MG tablet Take 1 tablet (250 mg total) by mouth 2 (two) times daily as needed for mild pain or moderate pain (take with food). 12/14/14   Francine Graven, DO  naproxen sodium (ANAPROX) 220 MG tablet Take 220 mg by mouth 2 (two) times daily as needed (pain).    Historical Provider, MD  oxyCODONE-acetaminophen (PERCOCET/ROXICET) 5-325 MG per tablet Take 1-2 tablets by mouth every 6 (six) hours as needed for moderate pain or severe pain. Patient not taking: Reported on 12/14/2014 02/08/14   Antonietta Breach, PA-C  phentermine (ADIPEX-P) 37.5 MG tablet Take 37.5 mg by mouth daily. 11/22/14   Historical Provider, MD    Family History Family History  Problem Relation Age of Onset  . COPD Mother   . Hypertension Mother   . Heart disease Mother   . Hyperlipidemia Sister   .  Hypertension Sister   . Depression Brother   . Drug abuse Brother   . Cancer Paternal Aunt   . Cancer Paternal Uncle     Social History Social History  Substance Use Topics  . Smoking status: Former Smoker    Packs/day: 1.00    Types: Cigarettes    Quit date: 09/04/2008  . Smokeless tobacco: Never Used  . Alcohol use Yes     Allergies   Penicillins   Review of Systems Review of Systems  Constitutional: Negative for fever.  Gastrointestinal: Positive for abdominal pain. Negative for anal bleeding, anorexia and vomiting.  Genitourinary: Negative for dysuria and hematuria.  Musculoskeletal: Negative for myalgias.  All other systems reviewed and are  negative.    Physical Exam Updated Vital Signs BP 115/80 (BP Location: Left Arm)   Pulse 101   Temp 97.9 F (36.6 C)   Resp 20   Ht 5\' 4"  (1.626 m)   Wt 230 lb (104.3 kg)   SpO2 99%   BMI 39.48 kg/m   Physical Exam  Constitutional: She appears well-developed and well-nourished.  HENT:  Head: Normocephalic and atraumatic.  Nose: Nose normal.  Eyes: EOM are normal. Pupils are equal, round, and reactive to light.  Neck: Normal range of motion. Neck supple.  Cardiovascular: Normal rate and regular rhythm.   Pulmonary/Chest: Effort normal and breath sounds normal. She has no wheezes. She has no rales.  Abdominal: Soft. She exhibits no mass. Bowel sounds are increased. There is no tenderness. There is no rebound, no guarding, no tenderness at McBurney's point and negative Murphy's sign. No hernia.  Genitourinary:  Genitourinary Comments: Scant white discharge, no CMT no adnexal tenderness chaperone present  Musculoskeletal: Normal range of motion.  Skin: Skin is warm and dry. Capillary refill takes less than 2 seconds.  Psychiatric: She has a normal mood and affect.     ED Treatments / Results  Labs (all labs ordered are listed, but only abnormal results are displayed) Labs Reviewed  WET PREP, GENITAL  URINALYSIS, ROUTINE W REFLEX MICROSCOPIC (NOT AT [facility])  PREGNANCY, URINE  GC/CHLAMYDIA PROBE AMP ([place]) NOT AT [facility]   Vitals:   01/13/16 0122 01/13/16 0311  BP: 115/80 113/77  Pulse: 101 72  Resp: 20 20  Temp: 97.9 F (36.6 C) 97.9 F (36.6 C)   Results for orders placed or performed during the hospital encounter of 01/13/16  Wet prep, genital  Result Value Ref Range   Yeast Wet Prep HPF POC NONE SEEN NONE SEEN   Trich, Wet Prep NONE SEEN NONE SEEN   Clue Cells Wet Prep HPF POC NONE SEEN NONE SEEN   WBC, Wet Prep HPF POC MANY (A) NONE SEEN   Sperm NONE SEEN   Urinalysis, Routine w reflex microscopic (not at [facility])  Result Value Ref Range   Color, Urine  YELLOW YELLOW   APPearance CLEAR CLEAR   Specific Gravity, Urine 1.025 1.005 - 1.030   pH 6.0 5.0 - 8.0   Glucose, UA NEGATIVE NEGATIVE mg/dL   Hgb urine dipstick NEGATIVE NEGATIVE   Bilirubin Urine NEGATIVE NEGATIVE   Ketones, ur NEGATIVE NEGATIVE mg/dL   Protein, ur NEGATIVE NEGATIVE mg/dL   Nitrite NEGATIVE NEGATIVE   Leukocytes, UA NEGATIVE NEGATIVE  Pregnancy, urine  Result Value Ref Range   Preg Test, Ur NEGATIVE NEGATIVE   Ct Abdomen Pelvis Wo Contrast  Result Date: 01/04/2016 CLINICAL DATA:  35 year old female with bilateral flank pain for 1 week. Nephrolithiasis. Initial encounter. EXAM:  CT ABDOMEN AND PELVIS WITHOUT CONTRAST TECHNIQUE: Multidetector CT imaging of the abdomen and pelvis was performed following the standard protocol without IV contrast. COMPARISON:  CT Abdomen and Pelvis 12/14/2014. FINDINGS: Negative lung bases.  No pericardial or pleural effusion. No acute osseous abnormality identified. IUD in place and appears normally located. No pelvic free fluid. Otherwise negative noncontrast uterus and adnexa. Decompressed distal colon. Redundant but otherwise negative sigmoid colon. Negative left colon and transverse colon aside from some redundancy and retained stool. Negative right colon and appendix. Negative terminal ileum. No dilated small bowel. The stomach is largely decompressed. Negative duodenum. No abdominal free air or free fluid. Surgically absent gallbladder. Negative noncontrast liver, spleen, pancreas and adrenal glands. Punctate left lower pole nephrolithiasis. No left hydronephrosis or hydroureter. Negative course of the left ureter, several posterior left pelvic phleboliths are stable. Multiple small intrarenal calculi on the right up to 3-4 mm. No right hydronephrosis or hydroureter. Negative course of the right ureter. Multiple right side pelvic phleboliths are stable, including 1 chronic calcification just along the undersurface of the urinary bladder as seen  on series 4, image 66 (and previously on series 6, image 62). Urinary bladder otherwise unremarkable. IMPRESSION: 1. Bilateral nephrolithiasis. No ureteral calculus identified. No secondary findings of obstructive uropathy. 2. Negative appendix. No inflammatory process identified in the noncontrast abdomen or pelvis. Electronically Signed   By: Genevie Ann M.D.   On: 01/04/2016 15:54   Dg Abdomen Acute W/chest  Result Date: 01/13/2016 CLINICAL DATA:  Lower abdominal pain. EXAM: DG ABDOMEN ACUTE W/ 1V CHEST COMPARISON:  CT 01/04/2016 FINDINGS: There is no evidence of dilated bowel loops or free intraperitoneal air. Moderate stool volume without rectal impaction. No radiopaque calculi or other significant radiographic abnormality is seen. Cholecystectomy clips. IUD. Heart size and mediastinal contours are within normal limits. Both lungs are clear. IMPRESSION: 1. No acute finding. 2. Moderate stool volume without impaction. Electronically Signed   By: Monte Fantasia M.D.   On: 01/13/2016 03:45    Procedures Procedures (including critical care time)  Medications Ordered in ED Medications  ketorolac (TORADOL) injection 60 mg (not administered)  dicyclomine (BENTYL) injection 20 mg (not administered)  gi cocktail (Maalox,Lidocaine,Donnatal) (not administered)     Initial Impression / Assessment and Plan / ED Course  I have reviewed the triage vital signs and the nursing notes.  Pertinent labs & imaging results that were available during my care of the patient were reviewed by me and considered in my medical decision making (see chart for details).   Final Clinical Impressions(s) / ED Diagnoses   Final diagnoses:  None  I was very up front with the patient nad stated to her that we may not find the source of this pain as it is recurrent and has been going on for 2 weeks and is not relieved by narcotics.    New Prescriptions New Prescriptions   No medications on file  Patient had the exact  same symptoms last summer at the same time.  She was worked up and no source was found.  The vicodin she is taking has caused constipation.  She is also gassy on exam.  I will treat for constipation and spasm.  Follow up with your PMD and your GYN.  Given the patient has no tenderness and is married she has elected to wait for the results of her GC and chlamydia which is reasonable.  All questions answered to patient's satisfaction. Based on history and exam patient has  been appropriately medically screened and emergency conditions excluded. Patient is stable for discharge at this time. Follow up with your PMDfor recheck in 2 daysand strict return precautions given.    Veatrice Kells, MD 01/13/16 [phone]

## 2016-01-14 LAB — GC/CHLAMYDIA PROBE AMP (~~LOC~~) NOT AT ARMC
Chlamydia: NEGATIVE
Neisseria Gonorrhea: NEGATIVE

## 2016-01-18 ENCOUNTER — Other Ambulatory Visit (HOSPITAL_COMMUNITY)
Admission: RE | Admit: 2016-01-18 | Discharge: 2016-01-18 | Disposition: A | Discharge status: Home / Self Care | Payer: BLUE CROSS/BLUE SHIELD | Source: Ambulatory Visit | Attending: Obstetrics and Gynecology | Admitting: Obstetrics and Gynecology

## 2016-01-18 ENCOUNTER — Other Ambulatory Visit: Payer: Self-pay | Admitting: Obstetrics and Gynecology

## 2016-01-18 DIAGNOSIS — R102 Pelvic and perineal pain: Secondary | ICD-10-CM | POA: Insufficient documentation

## 2016-01-22 LAB — CYTOLOGY - PAP

## 2016-03-13 NOTE — Patient Instructions (Signed)
Your procedure is scheduled on:  Wednesday, Oct. 25, 2017  Enter through the [employer] [facility] at:  7:45 AM  Pick up the phone at the desk and dial [phone].  Call this number if you have problems the morning of surgery: [phone].  Remember: Do NOT eat food or drink after:  Midnight Tuesday  Take these medicines the morning of surgery with a SIP OF WATER:  None  Stop taking Phentermine at this time.  Stop ALL herbal medications at this time   Do NOT wear jewelry (body piercing), metal hair clips/bobby pins, make-up, or nail polish. Do NOT wear lotions, powders, or perfumes.  You may wear deodorant. Do NOT shave for 48 hours prior to surgery. Do NOT bring valuables to the hospital. Contacts, dentures, or bridgework may not be worn into surgery.  Have a responsible adult drive you home and stay with you for 24 hours after your procedure

## 2016-03-14 ENCOUNTER — Encounter (HOSPITAL_COMMUNITY): Payer: Self-pay

## 2016-03-14 ENCOUNTER — Encounter (HOSPITAL_COMMUNITY)
Admission: RE | Admit: 2016-03-14 | Discharge: 2016-03-14 | Disposition: A | Discharge status: Home / Self Care | Payer: BLUE CROSS/BLUE SHIELD | Source: Ambulatory Visit | Attending: Obstetrics and Gynecology | Admitting: Obstetrics and Gynecology

## 2016-03-14 DIAGNOSIS — Z01818 Encounter for other preprocedural examination: Secondary | ICD-10-CM | POA: Insufficient documentation

## 2016-03-14 HISTORY — DX: Anemia, unspecified: D64.9

## 2016-03-14 HISTORY — DX: Personal history of urinary calculi: Z87.442

## 2016-03-14 LAB — CBC
HCT: 36.6 % (ref 36.0–46.0)
Hemoglobin: 12.3 g/dL (ref 12.0–15.0)
MCH: 29.1 pg (ref 26.0–34.0)
MCHC: 33.6 g/dL (ref 30.0–36.0)
MCV: 86.5 fL (ref 78.0–100.0)
PLATELETS: 350 10*3/uL (ref 150–400)
RBC: 4.23 MIL/uL (ref 3.87–5.11)
RDW: 13.7 % (ref 11.5–15.5)
WBC: 10.6 10*3/uL — AB (ref 4.0–10.5)

## 2016-03-18 MED ORDER — METRONIDAZOLE IN NACL 5-0.79 MG/ML-% IV SOLN
500.0000 mg | INTRAVENOUS | Status: AC
  Administered 2016-03-19: 500 mg via INTRAVENOUS
  Filled 2016-03-18: qty 100

## 2016-03-18 MED ORDER — CIPROFLOXACIN IN D5W 400 MG/200ML IV SOLN
400.0000 mg | INTRAVENOUS | Status: AC
  Administered 2016-03-19: 400 mg via INTRAVENOUS
  Filled 2016-03-18: qty 200

## 2016-03-19 ENCOUNTER — Encounter (HOSPITAL_COMMUNITY): Payer: Self-pay | Admitting: Emergency Medicine

## 2016-03-19 ENCOUNTER — Encounter (HOSPITAL_COMMUNITY)
Admission: RE | Disposition: A | Discharge status: Home / Self Care | Payer: Self-pay | Source: Ambulatory Visit | Attending: Obstetrics and Gynecology

## 2016-03-19 ENCOUNTER — Ambulatory Visit (HOSPITAL_COMMUNITY)
Admission: RE | Admit: 2016-03-19 | Discharge: 2016-03-19 | Disposition: A | Discharge status: Home / Self Care | Payer: BLUE CROSS/BLUE SHIELD | Source: Ambulatory Visit | Attending: Obstetrics and Gynecology | Admitting: Obstetrics and Gynecology

## 2016-03-19 ENCOUNTER — Ambulatory Visit (HOSPITAL_COMMUNITY): Payer: BLUE CROSS/BLUE SHIELD | Admitting: Anesthesiology

## 2016-03-19 DIAGNOSIS — Z6839 Body mass index (BMI) 39.0-39.9, adult: Secondary | ICD-10-CM | POA: Diagnosis not present

## 2016-03-19 DIAGNOSIS — Z87891 Personal history of nicotine dependence: Secondary | ICD-10-CM | POA: Insufficient documentation

## 2016-03-19 DIAGNOSIS — F329 Major depressive disorder, single episode, unspecified: Secondary | ICD-10-CM | POA: Insufficient documentation

## 2016-03-19 DIAGNOSIS — Z302 Encounter for sterilization: Secondary | ICD-10-CM | POA: Insufficient documentation

## 2016-03-19 DIAGNOSIS — D649 Anemia, unspecified: Secondary | ICD-10-CM | POA: Diagnosis not present

## 2016-03-19 HISTORY — PX: LAPAROSCOPIC BILATERAL SALPINGECTOMY: SHX5889

## 2016-03-19 LAB — TYPE AND SCREEN
ABO/RH(D): A NEG
ANTIBODY SCREEN: NEGATIVE

## 2016-03-19 LAB — PREGNANCY, URINE: PREG TEST UR: NEGATIVE

## 2016-03-19 SURGERY — SALPINGECTOMY, BILATERAL, LAPAROSCOPIC
Anesthesia: General | Site: Abdomen | Laterality: Bilateral

## 2016-03-19 MED ORDER — HYDROMORPHONE HCL 1 MG/ML IJ SOLN
INTRAMUSCULAR | Status: AC
  Administered 2016-03-19: 0.5 mg via INTRAVENOUS
  Filled 2016-03-19: qty 1

## 2016-03-19 MED ORDER — HYDROMORPHONE HCL 1 MG/ML IJ SOLN
0.2500 mg | INTRAMUSCULAR | Status: DC | PRN
  Administered 2016-03-19 (×2): 0.5 mg via INTRAVENOUS

## 2016-03-19 MED ORDER — BUPIVACAINE HCL (PF) 0.25 % IJ SOLN
INTRAMUSCULAR | Status: DC | PRN
  Administered 2016-03-19: 30 mL

## 2016-03-19 MED ORDER — SCOPOLAMINE 1 MG/3DAYS TD PT72
1.0000 | MEDICATED_PATCH | Freq: Once | TRANSDERMAL | Status: DC
  Administered 2016-03-19: 1.5 mg via TRANSDERMAL

## 2016-03-19 MED ORDER — ROCURONIUM BROMIDE 100 MG/10ML IV SOLN
INTRAVENOUS | Status: AC
  Filled 2016-03-19: qty 1

## 2016-03-19 MED ORDER — LIDOCAINE HCL (CARDIAC) 20 MG/ML IV SOLN
INTRAVENOUS | Status: DC | PRN
  Administered 2016-03-19: 100 mg via INTRAVENOUS

## 2016-03-19 MED ORDER — PROMETHAZINE HCL 25 MG/ML IJ SOLN: 6.2500 mg | INTRAMUSCULAR | Status: DC | PRN

## 2016-03-19 MED ORDER — SUGAMMADEX SODIUM 500 MG/5ML IV SOLN
INTRAVENOUS | Status: DC | PRN
  Administered 2016-03-19: 200 mg via INTRAVENOUS

## 2016-03-19 MED ORDER — MIDAZOLAM HCL 2 MG/2ML IJ SOLN
INTRAMUSCULAR | Status: AC
  Filled 2016-03-19: qty 2

## 2016-03-19 MED ORDER — DEXAMETHASONE SODIUM PHOSPHATE 10 MG/ML IJ SOLN
INTRAMUSCULAR | Status: AC
  Filled 2016-03-19: qty 1

## 2016-03-19 MED ORDER — LACTATED RINGERS IV SOLN
INTRAVENOUS | Status: DC
  Administered 2016-03-19 (×3): via INTRAVENOUS

## 2016-03-19 MED ORDER — GLYCOPYRROLATE 0.2 MG/ML IJ SOLN
INTRAMUSCULAR | Status: AC
  Filled 2016-03-19: qty 1

## 2016-03-19 MED ORDER — SUGAMMADEX SODIUM 200 MG/2ML IV SOLN
INTRAVENOUS | Status: AC
  Filled 2016-03-19: qty 2

## 2016-03-19 MED ORDER — PHENYLEPHRINE 40 MCG/ML (10ML) SYRINGE FOR IV PUSH (FOR BLOOD PRESSURE SUPPORT)
PREFILLED_SYRINGE | INTRAVENOUS | Status: AC
  Filled 2016-03-19: qty 10

## 2016-03-19 MED ORDER — PROPOFOL 10 MG/ML IV BOLUS
INTRAVENOUS | Status: AC
  Filled 2016-03-19: qty 20

## 2016-03-19 MED ORDER — ONDANSETRON HCL 4 MG/2ML IJ SOLN
INTRAMUSCULAR | Status: AC
  Filled 2016-03-19: qty 2

## 2016-03-19 MED ORDER — GLYCOPYRROLATE 0.2 MG/ML IJ SOLN
INTRAMUSCULAR | Status: DC | PRN
  Administered 2016-03-19: 0.1 mg via INTRAVENOUS

## 2016-03-19 MED ORDER — ONDANSETRON HCL 4 MG/2ML IJ SOLN
INTRAMUSCULAR | Status: DC | PRN
  Administered 2016-03-19: 4 mg via INTRAVENOUS

## 2016-03-19 MED ORDER — PROPOFOL 10 MG/ML IV BOLUS
INTRAVENOUS | Status: DC | PRN
  Administered 2016-03-19: 200 mg via INTRAVENOUS

## 2016-03-19 MED ORDER — FENTANYL CITRATE (PF) 100 MCG/2ML IJ SOLN
INTRAMUSCULAR | Status: DC | PRN
  Administered 2016-03-19 (×4): 50 ug via INTRAVENOUS

## 2016-03-19 MED ORDER — FENTANYL CITRATE (PF) 250 MCG/5ML IJ SOLN
INTRAMUSCULAR | Status: AC
  Filled 2016-03-19: qty 5

## 2016-03-19 MED ORDER — PHENYLEPHRINE HCL 10 MG/ML IJ SOLN
INTRAMUSCULAR | Status: DC | PRN
  Administered 2016-03-19: 40 mg via INTRAVENOUS

## 2016-03-19 MED ORDER — BUPIVACAINE HCL (PF) 0.25 % IJ SOLN
INTRAMUSCULAR | Status: AC
  Filled 2016-03-19: qty 30

## 2016-03-19 MED ORDER — LIDOCAINE HCL (CARDIAC) 20 MG/ML IV SOLN
INTRAVENOUS | Status: AC
  Filled 2016-03-19: qty 5

## 2016-03-19 MED ORDER — SCOPOLAMINE 1 MG/3DAYS TD PT72
MEDICATED_PATCH | TRANSDERMAL | Status: AC
  Administered 2016-03-19: 1.5 mg via TRANSDERMAL
  Filled 2016-03-19: qty 1

## 2016-03-19 MED ORDER — KETOROLAC TROMETHAMINE 30 MG/ML IJ SOLN
INTRAMUSCULAR | Status: DC | PRN
  Administered 2016-03-19: 30 mg via INTRAVENOUS

## 2016-03-19 MED ORDER — OXYCODONE-ACETAMINOPHEN 5-325 MG PO TABS: 1.0000 | ORAL_TABLET | ORAL | 0 refills | Status: DC | PRN

## 2016-03-19 MED ORDER — ROCURONIUM BROMIDE 100 MG/10ML IV SOLN
INTRAVENOUS | Status: DC | PRN
  Administered 2016-03-19: 40 mg via INTRAVENOUS
  Administered 2016-03-19: 10 mg via INTRAVENOUS

## 2016-03-19 MED ORDER — DEXAMETHASONE SODIUM PHOSPHATE 10 MG/ML IJ SOLN
INTRAMUSCULAR | Status: DC | PRN
  Administered 2016-03-19: 10 mg via INTRAVENOUS

## 2016-03-19 MED ORDER — MIDAZOLAM HCL 2 MG/2ML IJ SOLN
INTRAMUSCULAR | Status: DC | PRN
  Administered 2016-03-19: 2 mg via INTRAVENOUS

## 2016-03-19 MED ORDER — IBUPROFEN 600 MG PO TABS: 600.0000 mg | ORAL_TABLET | Freq: Four times a day (QID) | ORAL | 0 refills | Status: DC | PRN

## 2016-03-19 SURGICAL SUPPLY — 29 items
BENZOIN TINCTURE PRP APPL 2/3 (GAUZE/BANDAGES/DRESSINGS) IMPLANT
CLOTH BEACON ORANGE TIMEOUT ST (SAFETY) ×2 IMPLANT
DRSG OPSITE POSTOP 3X4 (GAUZE/BANDAGES/DRESSINGS) IMPLANT
DURAPREP 26ML APPLICATOR (WOUND CARE) ×2 IMPLANT
FORCEPS CUTTING 33CM 5MM (CUTTING FORCEPS) IMPLANT
GLOVE BIO SURGEON STRL SZ7 (GLOVE) ×2 IMPLANT
GLOVE BIOGEL PI IND STRL 7.0 (GLOVE) ×2 IMPLANT
GLOVE BIOGEL PI INDICATOR 7.0 (GLOVE) ×2
GOWN STRL REUS W/TWL LRG LVL3 (GOWN DISPOSABLE) ×4 IMPLANT
LIQUID BAND (GAUZE/BANDAGES/DRESSINGS) ×2 IMPLANT
PACK LAPAROSCOPY BASIN (CUSTOM PROCEDURE TRAY) ×2 IMPLANT
PACK TRENDGUARD 450 HYBRID PRO (MISCELLANEOUS) IMPLANT
PACK TRENDGUARD 600 HYBRD PROC (MISCELLANEOUS) ×1 IMPLANT
POUCH SPECIMEN RETRIEVAL 10MM (ENDOMECHANICALS) IMPLANT
PROTECTOR NERVE ULNAR (MISCELLANEOUS) ×4 IMPLANT
SET IRRIG TUBING LAPAROSCOPIC (IRRIGATION / IRRIGATOR) IMPLANT
SHEARS HARMONIC ACE PLUS 36CM (ENDOMECHANICALS) ×2 IMPLANT
SLEEVE XCEL OPT CAN 5 100 (ENDOMECHANICALS) ×4 IMPLANT
SUT MNCRL AB 4-0 PS2 18 (SUTURE) ×2 IMPLANT
SUT PLAIN 2 0 (SUTURE)
SUT PLAIN ABS 2-0 CT1 27XMFL (SUTURE) IMPLANT
SUT VICRYL 0 UR6 27IN ABS (SUTURE) ×2 IMPLANT
TOWEL OR 17X24 6PK STRL BLUE (TOWEL DISPOSABLE) ×4 IMPLANT
TRAY FOLEY CATH SILVER 14FR (SET/KITS/TRAYS/PACK) ×2 IMPLANT
TRENDGUARD 450 HYBRID PRO PACK (MISCELLANEOUS)
TRENDGUARD 600 HYBRID PROC PK (MISCELLANEOUS) ×2
TROCAR XCEL NON-BLD 11X100MML (ENDOMECHANICALS) IMPLANT
TROCAR XCEL NON-BLD 5MMX100MML (ENDOMECHANICALS) ×2 IMPLANT
WATER STERILE IRR 1000ML POUR (IV SOLUTION) ×2 IMPLANT

## 2016-03-19 NOTE — Transfer of Care (Signed)
Immediate Anesthesia Transfer of Care Note  Patient: Katherine Fritz  Procedure(s) Performed: Procedure(s): LAPAROSCOPIC BILATERAL SALPINGECTOMY (Bilateral)  Patient Location: PACU  Anesthesia Type:General  Level of Consciousness: awake and patient cooperative  Airway & Oxygen Therapy: Patient Spontanous Breathing and Patient connected to nasal cannula oxygen  Post-op Assessment: Report given to RN and Post -op Vital signs reviewed and stable  Post vital signs: Reviewed and stable  Last Vitals:  Vitals:   03/19/16 0814  BP: 127/82  Pulse: 82  Resp: 16  Temp: 36.7 C    Last Pain:  Vitals:   03/19/16 0814  TempSrc: Oral  PainSc: 2       Patients Stated Pain Goal: 3 ([id number])  Complications: No apparent anesthesia complications

## 2016-03-19 NOTE — H&P (Signed)
History of Present Illness  General:  Pt states she is getting better. Pt does not have lower abdomina pain persistantly now. More intermittent. Last day of Doxycycline and several days left of Flagyl. Desires sterilization. Husband will not have vasectomy. Would like to have done at the end of the year. Will use OCPs until procedure.   Current Medications  Taking   Zofran(Ondansetron HCl) 4 MG Tablet 1 tablet Orally Every 8 hours prn nausea and/or vomiting   Ibuprofen 800 MG Tablet 1 tablet Orally every 8 hrs prn pain   Valacyclovir HCl 1 GM Tablet 2 tablet Orally BID, Notes: as needed   Doxycycline Hyclate 100 MG Tablet 1 tablet Orally Twice a day   Flagyl(MetroNIDAZOLE) 500 MG Tablet 1 tablet Orally Twice Daily   Not-Taking/PRN   Hydrocodone-Acetaminophen 5-325 MG Tablet 1-2 tablet as needed Orally every 6 hrs prn severe pain   Phentermine HCl 37.5 MG Tablet 1 capsule Orally Once a day, Notes: not taking   Discontinued   Flagyl(MetroNIDAZOLE) 500 MG Tablet 1 tablet Orally Two times a day   Mirena 20 MCG/24HR Intrauterine Device Intrauterine   Docusate Sodium 100 MG Capsule 1 capsule as needed Orally BID   Polyethylene Glycol 3350 - Packet 1 packet mixed with 8 ounces of fluid Orally TID prn constipation   Enema 7-19 GM/118ML Enema as directed Rectal once prn no BM in 2 days   Medication List reviewed and reconciled with the patient    Past Medical History  kidney stones- [facility].   Normal thyroid u.s 2015.   Depression.   Obesity.    Surgical History  Lap Cholecystectomy 07/14/2013   Family History  Father: unknown  Mother: alive 31 yrs, COPD, diagnosed with HTN, COPD  Brother 69: alive 57 yrs, brain anerysm  Brother2: alive 49 yrs, healthy  Brother 51: alive 21 yrs, healthy  Sister 1: alive 41 yrs, anxiety, diagnosed with HTN  Maternal aunt: deceased, diagnosed with Breast Ca  3 son(s) , 1 daughter(s) - healthy.   2 sons with ADHD.   Social History  General:   Tobacco use  cigarettes: Former smoker Quit in year 2010 Tobacco history last updated 02/06/2016 no Alcohol, no.  Caffeine: yes, 2-3 cups coffee.  no Recreational drug use, no.  DIET: yes, low fat.  no Exercise, not at present due to surgery.  Marital Status: married, Married.  Children: 3 sons ages 40,14,4; 1 duaghter 55mos.  EDUCATION: [employer].  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  Seat belt use: always.  Firearms at home: no.    Gyn History  Sexual activity currently sexually active.  LMP no period.  Denies H/O Birth control.  Last pap smear date 08/05/2013.  Denies H/O Last mammogram date.  Abnormal pap smear assessed with colposcopy normal.    OB History  Pregnancy # 1 live birth, vaginal delivery, boy.  Pregnancy # 2 live birth, vaginal delivery, boy.  Pregnancy # 3 live birth, vaginal delivery, boy.  Pregnancy # 4: live birth, vaginal delivery, girl.    Allergies  Penicillin: rash (can take amoxicillin): Allergy   Hospitalization/Major Diagnostic Procedure  childbirth-NSVD x4 1998,2000,2010,2014   Review of Systems  See HPI.   Vital Signs  Wt 230, Ht 64.25, BMI 39.17, Pulse sitting 77, BP sitting 121/80.   Physical Examination  GENERAL:  Patient appears alert and oriented.  General Appearance: well-appearing, well-developed, no acute distress.  Speech: clear.  LUNGS:  Effort: no respiratory distress, comfortable breathing.  ABDOMEN:  General: no masses tenderness or organomegaly, non distended.  FEMALE GENITOURINARY:  Cervix visualized, healthy appearing, no discharge, no lesions.  Adnexa: no mass, non tender.  Uterus: normal size/shape/consistency, freely mobile, less tenderness.  Vagina: pink/moist mucosa, no lesions, no abnormal discharge.  Vulva: normal, no lesions, no skin discoloration.  Anus: no external hemosrhoids.  EXTREMITIES:  general no edema.     Assessments   1. Endometritis - N71.9 (Primary),  Improving.   2. Sterilization consult - Z30.09   3. Encounter for initial prescription of contraceptive pills - Z30.011   Treatment  1. Endometritis  Continue Flagyl Tablet, 500 MG, 1 tablet, Orally, Twice Daily   2. Sterilization consult  Referral [id number] [facility]cology Reason:Precert Bilateral salpingectomy, laparoscopic. Desires sterilization Pt counseled on the permanent nature of the procedure.  Pt informed of BTL vs. Bilateral salpingectomy.  Also informed of possible menstrual irregularities.  Declines endometrial ablation.   3. Encounter for initial prescription of contraceptive pills  Start Taytulla capsule, 1mg /44mcg, one, By Mouth, At the Same Time Every Day, 90 days, 3, Refills 1 Notes: Sample given with coupon card.    Follow Up  prn (Reason: preop)

## 2016-03-19 NOTE — Anesthesia Procedure Notes (Signed)
Procedure Name: Intubation Date/Time: 03/19/2016 12:34 PM Performed by: Georgeanne Nim Pre-anesthesia Checklist: Patient identified, Emergency Drugs available, Suction available, Patient being monitored and Timeout performed Patient Re-evaluated:Patient Re-evaluated prior to inductionOxygen Delivery Method: Circle system utilized Preoxygenation: Pre-oxygenation with 100% oxygen Intubation Type: IV induction Ventilation: Mask ventilation without difficulty Laryngoscope Size: Mac and 3 Grade View: Grade I Tube type: Oral Tube size: 7.0 mm Number of attempts: 1 Airway Equipment and Method: Stylet Placement Confirmation: ETT inserted through vocal cords under direct vision,  positive ETCO2,  CO2 detector and breath sounds checked- equal and bilateral Secured at: 21 cm Tube secured with: Tape Dental Injury: Teeth and Oropharynx as per pre-operative assessment

## 2016-03-19 NOTE — Brief Op Note (Signed)
03/19/2016  12:04 PM  PATIENT:  Katherine Fritz  35 y.o. female  PRE-OPERATIVE DIAGNOSIS:   Desires Sterilization  POST-OPERATIVE DIAGNOSIS:  Same  PROCEDURE:  Procedure(s): LAPAROSCOPIC BILATERAL SALPINGECTOMY (Bilateral)  SURGEON:  Surgeon(s) and Role:    * Thurnell Lose, MD - Primary      PHYSICIAN ASSISTANT:   ASSISTANTS: Jossie Ng   ANESTHESIA:   local and general  EBL:  No intake/output data recorded.  BLOOD ADMINISTERED:none  DRAINS: Urinary Catheter (Foley)   LOCAL MEDICATIONS USED:  0.25% MARCAINE- 30 ml    SPECIMEN:  Source of Specimen:  Bilateral Fallopian tubes  DISPOSITION OF SPECIMEN:  PATHOLOGY  COUNTS:  YES  TOURNIQUET:  * No tourniquets in log *  DICTATION: .Other Dictation: Dictation Number [id number]  PLAN OF CARE: Discharge to home after PACU  PATIENT DISPOSITION:  PACU - hemodynamically stable.   Delay start of Pharmacological VTE agent (>24hrs) due to surgical blood loss or risk of bleeding: not applicable

## 2016-03-19 NOTE — Discharge Instructions (Addendum)
DISCHARGE INSTRUCTIONS: Laparoscopy  The following instructions have been prepared to help you care for yourself upon your return home today.  Wound care:  Do not get the incision wet for the first 24 hours. The incision should be kept clean and dry.  The Band-Aids or dressings may be removed the day after surgery.  Should the incision become sore, red, and swollen after the first week, check with your doctor.  Personal hygiene:  Shower the day after your procedure.  Activity and limitations:  Do NOT drive or operate any equipment today.  Do NOT lift anything more than 15 pounds for 2-3 weeks after surgery.  Do NOT rest in bed all day.  Walking is encouraged. Walk each day, starting slowly with 5-minute walks 3 or 4 times a day. Slowly increase the length of your walks.  Walk up and down stairs slowly.  Do NOT do strenuous activities, such as golfing, playing tennis, bowling, running, biking, weight lifting, gardening, mowing, or vacuuming for 2-4 weeks. Ask your doctor when it is okay to start.  Diet: Eat a light meal as desired this evening. You may resume your usual diet tomorrow.  Return to work: This is dependent on the type of work you do. For the most part you can return to a desk job within a week of surgery. If you are more active at work, please discuss this with your doctor.  What to expect after your surgery: You may have a slight burning sensation when you urinate on the first day. You may have a very small amount of blood in the urine. Expect to have a small amount of vaginal discharge/light bleeding for 1-2 weeks. It is not unusual to have abdominal soreness and bruising for up to 2 weeks. You may be tired and need more rest for about 1 week. You may experience shoulder pain for 24-72 hours. Lying flat in bed may relieve it.  Call your doctor for any of the following:  Develop a fever of 100.4 or greater  Inability to urinate 6 hours after discharge from  hospital  Severe pain not relieved by pain medications  Persistent of heavy bleeding at incision site  Redness or swelling around incision site after a week  Increasing nausea or vomiting  Patient Signature________________________________________ Nurse Signature_________________________________________Salpingectomy, Care After Refer to this sheet in the next few weeks. These instructions provide you with information on caring for yourself after your procedure. Your health care provider may also give you more specific instructions. Your treatment has been planned according to current medical practices, but problems sometimes occur. Call your health care provider if you have any problems or questions after your procedure. WHAT TO EXPECT AFTER THE PROCEDURE After your procedure, it is typical to have the following:  Abdominal pain that can be controlled with pain medicine.  Vaginal spotting.  Tiredness. HOME CARE INSTRUCTIONS  Get plenty of rest and sleep.  Only take over-the-counter or prescription medicines as directed by your health care provider.  Keep incision areas clean and dry. Remove or change any bandages (dressings) only as directed by your health care provider.  You may resume your regular diet. Eat a well-balanced diet.  Drink enough fluids to keep your urine clear or pale yellow.  Limit exercise and activities as directed by your health care provider. Do not lift anything heavier than 5 lb (2.3 kg) until your health care provider approves.  Do not drive until your health care provider approves.  Do not have sexual  intercourse until your health care provider says it is okay.  Take your temperature twice a day for the first week. Write those temperatures down.  Follow up with your health care provider as directed. SEEK MEDICAL CARE IF:  You have pain when you urinate.  You see pus coming out of the incision, or the incision is separating.  You have increasing  abdominal pain.  You have swelling or redness in the incision area.  You develop a rash.  You feel lightheaded.  You have pain that is not controlled with medicine. SEEK IMMEDIATE MEDICAL CARE IF:  You develop a fever.  You have increasing abdominal pain.  You develop chest or leg pain.  You develop shortness of breath.  You pass out.   This information is not intended to replace advice given to you by your health care provider. Make sure you discuss any questions you have with your health care provider.   Document Released: 08/16/2010 Document Revised: 06/02/2014 Document Reviewed: 11/03/2012 Elsevier Interactive Patient Education [employer].

## 2016-03-19 NOTE — Interval H&P Note (Signed)
History and Physical Interval Note:  03/19/2016 11:25 AM  Katherine Fritz  has presented today for surgery, with the diagnosis of  Female Sterilization  The various methods of treatment have been discussed with the patient and family. After consideration of risks, benefits and other options for treatment, the patient has consented to  Procedure(s): LAPAROSCOPIC BILATERAL SALPINGECTOMY (Bilateral) as a surgical intervention .  The patient's history has been reviewed, patient examined, no change in status, stable for surgery.  I have reviewed the patient's chart and labs.  Questions were answered to the patient's satisfaction.    Pain has decreased, now just cramping.  Menses started 2 days ago.   Simona Huh, Maximo Spratling

## 2016-03-19 NOTE — Anesthesia Preprocedure Evaluation (Signed)
Anesthesia Evaluation  Patient identified by MRN, date of birth, ID band Patient awake    Reviewed: Allergy & Precautions, NPO status , Patient's Chart, lab work & pertinent test results  History of Anesthesia Complications Negative for: history of anesthetic complications  Airway Mallampati: II  TM Distance: >3 FB Neck ROM: Full    Dental no notable dental hx. (+) Dental Advisory Given   Pulmonary former smoker,    Pulmonary exam normal        Cardiovascular negative cardio ROS Normal cardiovascular exam     Neuro/Psych PSYCHIATRIC DISORDERS Depression negative neurological ROS     GI/Hepatic negative GI ROS, Neg liver ROS,   Endo/Other  Morbid obesity  Renal/GU negative Renal ROS     Musculoskeletal   Abdominal   Peds  Hematology  (+) anemia ,   Anesthesia Other Findings   Reproductive/Obstetrics                             Anesthesia Physical Anesthesia Plan  ASA: II  Anesthesia Plan: General   Post-op Pain Management:    Induction: Intravenous  Airway Management Planned: Oral ETT  Additional Equipment:   Intra-op Plan:   Post-operative Plan: Extubation in OR  Informed Consent: I have reviewed the patients History and Physical, chart, labs and discussed the procedure including the risks, benefits and alternatives for the proposed anesthesia with the patient or authorized representative who has indicated his/her understanding and acceptance.   Dental advisory given  Plan Discussed with: CRNA and Anesthesiologist  Anesthesia Plan Comments:         Anesthesia Quick Evaluation

## 2016-03-20 ENCOUNTER — Encounter (HOSPITAL_COMMUNITY): Payer: Self-pay | Admitting: Obstetrics and Gynecology

## 2016-03-20 NOTE — Op Note (Signed)
[name], Katherine Fritz NO.:  [id number]  MEDICAL RECORD NO.:  [id number]  LOCATION:  [facility]                          FACILITY:  [facility]  PHYSICIAN:  Jola Schmidt, MD   DATE OF BIRTH:  [birth year]  DATE OF PROCEDURE:  03/19/2016 DATE OF DISCHARGE:  03/19/2016                              OPERATIVE REPORT   PREOPERATIVE DIAGNOSIS:  Desires sterilization.  POSTOPERATIVE DIAGNOSIS:  Desires sterilization.  PROCEDURE:  Laparoscopic bilateral salpingectomy.  SURGEON:  Raul Del. Simona Huh, MD.  ASSISTANT:  Technician, Charlene Brooke.  ANESTHESIA:  Local and general.  EBL:  Minimal.  BLOOD ADMINISTERED:  None.  DRAINS:  Foley catheter.  Local with 0.25% Marcaine 30 mL, total.  SPECIMEN:  Bilateral fallopian tubes.  Disposition of specimen is to pathology.  COUNTS CORRECT:  Yes.  DISPOSITION:  To PACU, hemodynamically stable.  COMPLICATIONS:  None.  FINDINGS:  Normal anteverted uterus.  Normal tubes.  No scar tissue noted.  Liver edge was normal.  The patient with moderately heavy menses.  PROCEDURE IN DETAIL:  The patient was identified in the holding area. She was eventually taken to OR 4 and placed in the dorsal lithotomy position and underwent general endotracheal anesthesia without complication.  She was positioned correctly on the bed.  She was then prepped and draped in a normal sterile fashion.  Time-out was performed while the patient prep was drying.  A Graves speculum was inserted into the vagina.  The cervix was visualized and the anterior lip of the cervix was grasped with a single- tooth tenaculum.  The uterus Hulka manipulator was advanced through the endocervix and applied to the anterior lip of the cervix.  The single- tooth tenaculum was removed and as well as the speculum was taken out of the vagina.  Attention was turned to the abdomen.  An umbilical incision was made in the umbilicus with a scalpel.  A 5-mm trocar was advanced  through the umbilicus under direct visualization.  With Intraabdominal access was confirmed, CO2 gas was started.  The patient was placed in Trendelenburg.  Two additional 5 mm ports were placed under direct visualization after transilluminating the abdomen.  Marcaine 0.25%, 10 mL per trocar site was injected prior to insertion of each trocar.  A blunt probe was used to displace the intestines.  The fallopian tubes were grasped with the atraumatic grasper and the Harmonic Scalpel was used to transect the fallopian tubes from the ovary.  That was carried down to the cornu of the uterus.  Minimal bleeding was noted.  It was cauterized with the Harmonic.  Same thing was done on the opposite side. Both resection sites were hemostatic and were not bleeding after the release of CO2 gas.  The trocars were then removed.  The incisions were closed subcuticular subcutaneously with 4-0 Monocryl in an inverted interrupted stitch.  Dermabond was applied to the incisions.  The speculum was inserted after the uterine Hulka manipulator was removed.  The tenaculum site was hemostatic.  The patient had some menstrual blood in the vault but was scant.  Foley was removed.  All instruments, sponge, and needle counts were correct x3.  The patient received Ancef 2 g  IV prior to the procedure.  She had SCDs on and operating throughout the case.     Jola Schmidt, MD     EBV/MEDQ  D:  03/19/2016  T:  03/20/2016  Job:  [id number]

## 2016-03-20 NOTE — Anesthesia Postprocedure Evaluation (Signed)
Anesthesia Post Note  Patient: Katherine Fritz  Procedure(s) Performed: Procedure(s) (LRB): LAPAROSCOPIC BILATERAL SALPINGECTOMY (Bilateral)  Patient location during evaluation: PACU Anesthesia Type: General Level of consciousness: sedated Pain management: pain level controlled Vital Signs Assessment: post-procedure vital signs reviewed and stable Respiratory status: spontaneous breathing and respiratory function stable Cardiovascular status: stable Anesthetic complications: no     Last Vitals:  Vitals:   03/19/16 1530 03/19/16 1533  BP: 115/85   Pulse: 88 98  Resp: 11 17  Temp:  37.1 C    Last Pain:  Vitals:   03/19/16 1533  TempSrc: Oral  PainSc: 2    Pain Goal: Patients Stated Pain Goal: 3 (03/19/16 0814)               Duane Boston DANIEL

## 2016-03-28 ENCOUNTER — Inpatient Hospital Stay (HOSPITAL_COMMUNITY)
Admission: AD | Admit: 2016-03-28 | Discharge: 2016-03-28 | Disposition: A | Discharge status: Home / Self Care | Payer: BLUE CROSS/BLUE SHIELD | Source: Ambulatory Visit | Attending: Obstetrics & Gynecology | Admitting: Obstetrics & Gynecology

## 2016-03-28 ENCOUNTER — Encounter (HOSPITAL_COMMUNITY): Payer: Self-pay | Admitting: *Deleted

## 2016-03-28 DIAGNOSIS — K2960 Other gastritis without bleeding: Secondary | ICD-10-CM | POA: Diagnosis not present

## 2016-03-28 DIAGNOSIS — T39395A Adverse effect of other nonsteroidal anti-inflammatory drugs [NSAID], initial encounter: Secondary | ICD-10-CM

## 2016-03-28 DIAGNOSIS — Z87442 Personal history of urinary calculi: Secondary | ICD-10-CM | POA: Insufficient documentation

## 2016-03-28 DIAGNOSIS — Z87891 Personal history of nicotine dependence: Secondary | ICD-10-CM | POA: Diagnosis not present

## 2016-03-28 DIAGNOSIS — Z880 Allergy status to penicillin: Secondary | ICD-10-CM | POA: Diagnosis not present

## 2016-03-28 DIAGNOSIS — R1012 Left upper quadrant pain: Secondary | ICD-10-CM | POA: Insufficient documentation

## 2016-03-28 DIAGNOSIS — K2970 Gastritis, unspecified, without bleeding: Secondary | ICD-10-CM | POA: Insufficient documentation

## 2016-03-28 HISTORY — DX: Unspecified abnormal cytological findings in specimens from vagina: R87.629

## 2016-03-28 LAB — COMPREHENSIVE METABOLIC PANEL
ALT: 19 U/L (ref 14–54)
ANION GAP: 7 (ref 5–15)
AST: 29 U/L (ref 15–41)
Albumin: 4 g/dL (ref 3.5–5.0)
Alkaline Phosphatase: 67 U/L (ref 38–126)
BUN: 13 mg/dL (ref 6–20)
CHLORIDE: 104 mmol/L (ref 101–111)
CO2: 26 mmol/L (ref 22–32)
Calcium: 9.6 mg/dL (ref 8.9–10.3)
Creatinine, Ser: 0.65 mg/dL (ref 0.44–1.00)
Glucose, Bld: 96 mg/dL (ref 65–99)
POTASSIUM: 4.1 mmol/L (ref 3.5–5.1)
Sodium: 137 mmol/L (ref 135–145)
TOTAL PROTEIN: 7.3 g/dL (ref 6.5–8.1)
Total Bilirubin: 0.6 mg/dL (ref 0.3–1.2)

## 2016-03-28 LAB — CBC WITH DIFFERENTIAL/PLATELET
BASOS ABS: 0 10*3/uL (ref 0.0–0.1)
BASOS PCT: 0 %
Eosinophils Absolute: 0.3 10*3/uL (ref 0.0–0.7)
Eosinophils Relative: 3 %
HEMATOCRIT: 39 % (ref 36.0–46.0)
Hemoglobin: 13.1 g/dL (ref 12.0–15.0)
LYMPHS PCT: 27 %
Lymphs Abs: 3.1 10*3/uL (ref 0.7–4.0)
MCH: 29.6 pg (ref 26.0–34.0)
MCHC: 33.6 g/dL (ref 30.0–36.0)
MCV: 88 fL (ref 78.0–100.0)
Monocytes Absolute: 0.5 10*3/uL (ref 0.1–1.0)
Monocytes Relative: 4 %
NEUTROS ABS: 7.5 10*3/uL (ref 1.7–7.7)
Neutrophils Relative %: 66 %
PLATELETS: 365 10*3/uL (ref 150–400)
RBC: 4.43 MIL/uL (ref 3.87–5.11)
RDW: 13.7 % (ref 11.5–15.5)
WBC: 11.3 10*3/uL — AB (ref 4.0–10.5)

## 2016-03-28 LAB — URINALYSIS, ROUTINE W REFLEX MICROSCOPIC
BILIRUBIN URINE: NEGATIVE
GLUCOSE, UA: NEGATIVE mg/dL
HGB URINE DIPSTICK: NEGATIVE
KETONES UR: 15 mg/dL — AB
LEUKOCYTES UA: NEGATIVE
Nitrite: NEGATIVE
PROTEIN: NEGATIVE mg/dL
Specific Gravity, Urine: 1.015 (ref 1.005–1.030)
pH: 6.5 (ref 5.0–8.0)

## 2016-03-28 LAB — D-DIMER, QUANTITATIVE: D-Dimer, Quant: 0.53 ug/mL-FEU — ABNORMAL HIGH (ref 0.00–0.50)

## 2016-03-28 LAB — LIPASE, BLOOD: LIPASE: 31 U/L (ref 11–51)

## 2016-03-28 MED ORDER — OXYCODONE-ACETAMINOPHEN 5-325 MG PO TABS
2.0000 | ORAL_TABLET | Freq: Once | ORAL | Status: AC
  Administered 2016-03-28: 2 via ORAL
  Filled 2016-03-28: qty 2

## 2016-03-28 MED ORDER — PANTOPRAZOLE SODIUM 20 MG PO TBEC: 20.0000 mg | DELAYED_RELEASE_TABLET | Freq: Every day | ORAL | 1 refills | Status: DC

## 2016-03-28 MED ORDER — PANTOPRAZOLE SODIUM 20 MG PO TBEC
20.0000 mg | DELAYED_RELEASE_TABLET | Freq: Every day | ORAL | Status: DC
  Administered 2016-03-28: 20 mg via ORAL
  Filled 2016-03-28 (×2): qty 1

## 2016-03-28 MED ORDER — ONDANSETRON HCL 4 MG/2ML IJ SOLN
4.0000 mg | Freq: Once | INTRAMUSCULAR | Status: AC
  Administered 2016-03-28: 4 mg via INTRAVENOUS
  Filled 2016-03-28: qty 2

## 2016-03-28 MED ORDER — MORPHINE SULFATE (PF) 4 MG/ML IV SOLN
2.0000 mg | Freq: Once | INTRAVENOUS | Status: AC
  Administered 2016-03-28: 2 mg via INTRAVENOUS
  Filled 2016-03-28: qty 1

## 2016-03-28 MED ORDER — OXYCODONE-ACETAMINOPHEN 5-325 MG PO TABS: 1.0000 | ORAL_TABLET | ORAL | 0 refills | Status: DC | PRN

## 2016-03-28 NOTE — Discharge Instructions (Signed)

## 2016-03-28 NOTE — MAU Note (Signed)
Patient was seen at [facility] In clinic and told to come here, having upper left abdominal pain x 2 days, s/p salphinectomy 1 week ago, nausea but no vomiting, pain is stabbing in nature, rates 8-9/10 when it comes, denies diarrhea, no constipation.

## 2016-03-28 NOTE — MAU Provider Note (Signed)
Chief Complaint: Abdominal Pain   First Provider Initiated Contact with Patient 03/28/16 1938      SUBJECTIVE HPI: Katherine Fritz is a 35 y.o. [id number] at 2 weeks S/P Laparoscopic bilat salpingectomy who presents to [facility] reporting sharp abdominal pain.  Patient reports 2 days of left upper quadrant abdominal pain. Pain does not radiate anywhere else. Described as sharp stabbing pain lasts for a few minutes and occurs multiple times in the day. Nothing brings the pain on nothing helps the pain go away. Patient states that brings her to tears as she is currently in tears from the pain on presentation. She denies any vomiting but elicits little bit of nausea. Able to tolerate food. Having normal bowel movements without bloody stools or melena. Stools or describes is soft and she is not straining and stools are not hard. She denies any fevers or chills. She says she has a history of kidney stones but this does not feel anything like that. Patient states back in August she had a severe infection from her IUD, not caused by an STD. She was on antibiotics for weeks. She recently had a laparoscopic tubal ligation 2 weeks ago she was only on narcotics for 1-2 days after the procedure. She states she has a little bit of right calf tenderness, but she attributes this to walking for a long time on Halloween night with her kids. She denies any lower extremity swelling or redness. She denies any vaginal discharge or bleeding. She has been taking Motrin on a regular basis since the surgery. She does not have history of ulcers, but she has had her gallbladder removed.   Past Medical History:  Diagnosis Date  . Abnormal Pap smear    Colposcopy normal  . Anemia   . Depression    Not on current treatment  . History of kidney stones   . Kidney stones 2009  . MRSA infection   . Ovarian cyst   . Vaginal Pap smear, abnormal    OB History  Gravida Para Term Preterm AB Living  4 4 4     4   SAB  TAB Ectopic Multiple Live Births          4    # Outcome Date GA Lbr Len/2nd Weight Sex Delivery Anes PTL Lv  4 Term 12/03/12 [redacted]w[redacted]d 13:28 / 00:11 9 lb 8.9 oz (4.335 kg) F Vag-Spont EPI  LIV  3 Term 01/16/09 [redacted]w[redacted]d  8 lb 7 oz (3.827 kg) M Vag-Spont EPI N LIV     Birth Comments: Induction for unclear reasons  2 Term 03/31/99 [redacted]w[redacted]d  6 lb 11 oz (3.033 kg) M Vag-Spont None N LIV  1 Term 12/03/96 [redacted]w[redacted]d  8 lb 6 oz (3.799 kg) M Vag-Spont None N LIV     Past Surgical History:  Procedure Laterality Date  . CHOLECYSTECTOMY    . COLPOSCOPY    . DENTAL SURGERY    . LAPAROSCOPIC BILATERAL SALPINGECTOMY Bilateral 03/19/2016   Procedure: LAPAROSCOPIC BILATERAL SALPINGECTOMY;  Surgeon: Thurnell Lose, MD;  Location: [facility];  Service: Gynecology;  Laterality: Bilateral;   Social History   Social History  . Marital status: Married    Spouse name: N/A  . Number of children: N/A  . Years of education: N/A   Occupational History  . Not on file.   Social History Main Topics  . Smoking status: Former Smoker    Packs/day: 1.00    Types: Cigarettes    Quit date: 09/04/2008  .  Smokeless tobacco: Never Used  . Alcohol use Yes  . Drug use: No  . Sexual activity: Not Currently    Birth control/ protection: Surgical   Other Topics Concern  . Not on file   Social History Narrative  . No narrative on file   No current facility-administered medications on file prior to encounter.    No current outpatient prescriptions on file prior to encounter.   Allergies  Allergen Reactions  . Penicillins Rash and Other (See Comments)    Has patient had a PCN reaction causing immediate rash, facial/tongue/throat swelling, SOB or lightheadedness with hypotension: No Has patient had a PCN reaction causing severe rash involving mucus membranes or skin necrosis: No Has patient had a PCN reaction that required hospitalization No Has patient had a PCN reaction occurring within the last 10 years: No If all of the  above answers are "NO", then may proceed with Cephalosporin use.    I have reviewed the past Medical Hx, Surgical Hx, Social Hx, Allergies and Medications.   REVIEW OF SYSTEMS  RESPIRATORY: no cough, shortness of breath, or wheezing CARDIOVASCULAR: no chest pain or dyspnea on exertion GASTROINTESTINAL: LUQ abdominal pain, no change in bowel habits, or black or bloody stools GENITO-URINARY: no dysuria, trouble voiding, or hematuria MUSKULOSKELETAL: negative for - gait disturbance or swelling in ankle - bilateral, foot - bilateral and leg - bilateral; minimal right calf tenderness NEUROLOGICAL: negative for - dizziness, gait disturbance, headaches, numbness/tingling or visual changes DERMATOLOGICAL: negative    OBJECTIVE Patient Vitals for the past 24 hrs:  BP Temp Temp src Pulse Resp Height Weight  03/28/16 1817 112/71 - - 82 18 - -  03/28/16 1730 135/79 98.3 F (36.8 C) Oral 81 18 5\' 4"  (1.626 m) 237 lb 1.9 oz (107.6 kg)    PHYSICAL EXAM Constitutional: Well-developed, well-nourished obese female in no acute distress but visibly upset (crying) from pain.  Cardiovascular: normal rate, rhythm, no murmurs Respiratory: normal rate and effort, CTAB GI: Abd soft, LUQ > epigastric TTP. Pos BS x 4 MS: Extremities nontender, no edema, normal ROM. Minimal right sided calf tenderness (patient states feels sore from walking). Homan's sign is negative. Neurologic: Alert and oriented x 4.  GU: Neg CVAT.      LAB RESULTS Results for orders placed or performed during the hospital encounter of 03/28/16 (from the past 24 hour(s))  Urinalysis, Routine w reflex microscopic (not at [facility])     Status: Abnormal   Collection Time: 03/28/16  5:54 PM  Result Value Ref Range   Color, Urine YELLOW YELLOW   APPearance CLEAR CLEAR   Specific Gravity, Urine 1.015 1.005 - 1.030   pH 6.5 5.0 - 8.0   Glucose, UA NEGATIVE NEGATIVE mg/dL   Hgb urine dipstick NEGATIVE NEGATIVE   Bilirubin Urine NEGATIVE  NEGATIVE   Ketones, ur 15 (A) NEGATIVE mg/dL   Protein, ur NEGATIVE NEGATIVE mg/dL   Nitrite NEGATIVE NEGATIVE   Leukocytes, UA NEGATIVE NEGATIVE    IMAGING No results found.  MAU COURSE CMP, CBC, Lipase, DDimer Morphine  9:17 PM - Signed out patient care to [place], [place]. Awaiting labwork to return. Patient's pain has subsided to a 2/10 as assessed by the RN.   Zenda Alpers, DO  [facility], [facility]  Results for orders placed or performed during the hospital encounter of 03/28/16 (from the past 24 hour(s))  Urinalysis, Routine w reflex microscopic (not at [facility])     Status:  Abnormal   Collection Time: 03/28/16  5:54 PM  Result Value Ref Range   Color, Urine YELLOW YELLOW   APPearance CLEAR CLEAR   Specific Gravity, Urine 1.015 1.005 - 1.030   pH 6.5 5.0 - 8.0   Glucose, UA NEGATIVE NEGATIVE mg/dL   Hgb urine dipstick NEGATIVE NEGATIVE   Bilirubin Urine NEGATIVE NEGATIVE   Ketones, ur 15 (A) NEGATIVE mg/dL   Protein, ur NEGATIVE NEGATIVE mg/dL   Nitrite NEGATIVE NEGATIVE   Leukocytes, UA NEGATIVE NEGATIVE  Comprehensive metabolic panel     Status: None   Collection Time: 03/28/16  8:20 PM  Result Value Ref Range   Sodium 137 135 - 145 mmol/L   Potassium 4.1 3.5 - 5.1 mmol/L   Chloride 104 101 - 111 mmol/L   CO2 26 22 - 32 mmol/L   Glucose, Bld 96 65 - 99 mg/dL   BUN 13 6 - 20 mg/dL   Creatinine, Ser 0.65 0.44 - 1.00 mg/dL   Calcium 9.6 8.9 - 10.3 mg/dL   Total Protein 7.3 6.5 - 8.1 g/dL   Albumin 4.0 3.5 - 5.0 g/dL   AST 29 15 - 41 U/L   ALT 19 14 - 54 U/L   Alkaline Phosphatase 67 38 - 126 U/L   Total Bilirubin 0.6 0.3 - 1.2 mg/dL   GFR calc non Af Amer >60 >60 mL/min   GFR calc Af Amer >60 >60 mL/min   Anion gap 7 5 - 15  Lipase, blood     Status: None   Collection Time: 03/28/16  8:20 PM  Result Value Ref Range   Lipase 31 11 - 51 U/L  CBC with Differential/Platelet     Status: Abnormal   Collection Time:  03/28/16  8:20 PM  Result Value Ref Range   WBC 11.3 (H) 4.0 - 10.5 K/uL   RBC 4.43 3.87 - 5.11 MIL/uL   Hemoglobin 13.1 12.0 - 15.0 g/dL   HCT 39.0 36.0 - 46.0 %   MCV 88.0 78.0 - 100.0 fL   MCH 29.6 26.0 - 34.0 pg   MCHC 33.6 30.0 - 36.0 g/dL   RDW 13.7 11.5 - 15.5 %   Platelets 365 150 - 400 K/uL   Neutrophils Relative % 66 %   Neutro Abs 7.5 1.7 - 7.7 K/uL   Lymphocytes Relative 27 %   Lymphs Abs 3.1 0.7 - 4.0 K/uL   Monocytes Relative 4 %   Monocytes Absolute 0.5 0.1 - 1.0 K/uL   Eosinophils Relative 3 %   Eosinophils Absolute 0.3 0.0 - 0.7 K/uL   Basophils Relative 0 %   Basophils Absolute 0.0 0.0 - 0.1 K/uL  D-dimer, quantitative (not at [facility])     Status: Abnormal   Collection Time: 03/28/16  8:20 PM  Result Value Ref Range   D-Dimer, Quant 0.53 (H) 0.00 - 0.50 ug/mL-FEU   Previous D-Dimer 0.57. Pain now 2/10. Pt in NAD.   Discussed Hx, exam, labs, response to meds w/ Dr. Alesia Richards. No new orders. Verified that no trocars were placed anywhere near site of pain. (Umbilical, LLQ and RLQ)  MDM - Acute LLQ pain possibly due to NSAID-related gastritis. Cannot exclude ulcer, but but does not appear to have a acute abdomen, Hgb normal. No evidence of surgical complication.   ASSESSMENT 1. NSAID induced gastritis    PLAN D/C home per consult w/ Dr. Alesia Richards. Abd pain precautions. Stop all NSAIDS Follow-up Information    WEBB, Valla Leaver, MD .  Specialty:  Family Medicine Why:  if no improvement in 3 days Contact information: [address]  [place] [address] [place] .   Specialty:  Emergency Medicine Why:  as needed in emergencies Contact information: [id number] [place] [phone]            Medication List    STOP taking these medications   ibuprofen 600 MG tablet Commonly known as:  ADVIL,MOTRIN     TAKE these  medications   oxyCODONE-acetaminophen 5-325 MG tablet Commonly known as:  PERCOCET/ROXICET Take 1-2 tablets by mouth every 4 (four) hours as needed for severe pain.   pantoprazole 20 MG tablet Commonly known as:  PROTONIX Take 1 tablet (20 mg total) by mouth daily. Start taking on:  03/29/2016       Manya Silvas, [place] 03/28/2016 10:50 PM

## 2016-04-09 ENCOUNTER — Other Ambulatory Visit: Payer: Self-pay | Admitting: Family Medicine

## 2016-04-09 ENCOUNTER — Other Ambulatory Visit: Payer: BLUE CROSS/BLUE SHIELD

## 2016-04-09 DIAGNOSIS — R1032 Left lower quadrant pain: Secondary | ICD-10-CM

## 2016-04-10 ENCOUNTER — Ambulatory Visit
Admission: RE | Admit: 2016-04-10 | Discharge: 2016-04-10 | Disposition: A | Discharge status: Home / Self Care | Payer: BLUE CROSS/BLUE SHIELD | Source: Ambulatory Visit | Attending: Family Medicine | Admitting: Family Medicine

## 2016-04-10 DIAGNOSIS — R1032 Left lower quadrant pain: Secondary | ICD-10-CM

## 2016-04-12 ENCOUNTER — Emergency Department (HOSPITAL_COMMUNITY)
Admission: EM | Admit: 2016-04-12 | Discharge: 2016-04-12 | Disposition: A | Discharge status: Home / Self Care | Payer: BLUE CROSS/BLUE SHIELD | Attending: Emergency Medicine | Admitting: Emergency Medicine

## 2016-04-12 ENCOUNTER — Encounter (HOSPITAL_COMMUNITY): Payer: Self-pay

## 2016-04-12 DIAGNOSIS — M545 Low back pain, unspecified: Secondary | ICD-10-CM

## 2016-04-12 DIAGNOSIS — R102 Pelvic and perineal pain: Secondary | ICD-10-CM | POA: Insufficient documentation

## 2016-04-12 DIAGNOSIS — Z79899 Other long term (current) drug therapy: Secondary | ICD-10-CM | POA: Diagnosis not present

## 2016-04-12 DIAGNOSIS — Z87891 Personal history of nicotine dependence: Secondary | ICD-10-CM | POA: Diagnosis not present

## 2016-04-12 DIAGNOSIS — G8929 Other chronic pain: Secondary | ICD-10-CM

## 2016-04-12 DIAGNOSIS — R110 Nausea: Secondary | ICD-10-CM | POA: Diagnosis not present

## 2016-04-12 LAB — CBC
HCT: 38.3 % (ref 36.0–46.0)
Hemoglobin: 12.5 g/dL (ref 12.0–15.0)
MCH: 28.9 pg (ref 26.0–34.0)
MCHC: 32.6 g/dL (ref 30.0–36.0)
MCV: 88.7 fL (ref 78.0–100.0)
Platelets: 342 10*3/uL (ref 150–400)
RBC: 4.32 MIL/uL (ref 3.87–5.11)
RDW: 13.5 % (ref 11.5–15.5)
WBC: 10.3 10*3/uL (ref 4.0–10.5)

## 2016-04-12 LAB — COMPREHENSIVE METABOLIC PANEL
ALBUMIN: 4.1 g/dL (ref 3.5–5.0)
ALK PHOS: 59 U/L (ref 38–126)
ALT: 16 U/L (ref 14–54)
AST: 27 U/L (ref 15–41)
Anion gap: 8 (ref 5–15)
BILIRUBIN TOTAL: 0.6 mg/dL (ref 0.3–1.2)
BUN: 11 mg/dL (ref 6–20)
CHLORIDE: 108 mmol/L (ref 101–111)
CO2: 22 mmol/L (ref 22–32)
Calcium: 9.2 mg/dL (ref 8.9–10.3)
Creatinine, Ser: 0.72 mg/dL (ref 0.44–1.00)
GFR calc Af Amer: >60 mL/min (ref >60)
Glucose, Bld: 100 mg/dL — ABNORMAL HIGH (ref 65–99)
Potassium: 3.9 mmol/L (ref 3.5–5.1)
Sodium: 138 mmol/L (ref 135–145)
Total Protein: 7.4 g/dL (ref 6.5–8.1)

## 2016-04-12 LAB — URINALYSIS, ROUTINE W REFLEX MICROSCOPIC
Bilirubin Urine: NEGATIVE
GLUCOSE, UA: NEGATIVE mg/dL
HGB URINE DIPSTICK: NEGATIVE
Ketones, ur: NEGATIVE mg/dL
Leukocytes, UA: NEGATIVE
Nitrite: NEGATIVE
PH: 6.5 (ref 5.0–8.0)
PROTEIN: NEGATIVE mg/dL
Specific Gravity, Urine: 1.026 (ref 1.005–1.030)

## 2016-04-12 LAB — POC URINE PREG, ED: Preg Test, Ur: NEGATIVE

## 2016-04-12 LAB — LIPASE, BLOOD: Lipase: 52 U/L — ABNORMAL HIGH (ref 11–51)

## 2016-04-12 MED ORDER — KETOROLAC TROMETHAMINE 60 MG/2ML IM SOLN
60.0000 mg | Freq: Once | INTRAMUSCULAR | Status: AC
  Administered 2016-04-12: 60 mg via INTRAMUSCULAR
  Filled 2016-04-12: qty 2

## 2016-04-12 MED ORDER — DICYCLOMINE HCL 20 MG PO TABS
20.0000 mg | ORAL_TABLET | Freq: Two times a day (BID) | ORAL | 0 refills | Status: DC
Start: 2016-04-12 — End: 2017-09-12

## 2016-04-12 NOTE — ED Triage Notes (Addendum)
Pt presents with c/o left side flank pain that radiates to her abdomen. Pt reports hx of kidney stones. Pt reports she recently had an CT to rule out diverticulitis which was in fact ruled out. Pt denies any hematuria. Reports this pain has been present for a couple of months but worse the last couple of days. Pt reports the pain initially started after she was diagnosed with an infection caused by her IUD and had that IUD removed. Pt then had her "tubes removed" and then the lower abdominal pain started.

## 2016-04-12 NOTE — ED Provider Notes (Signed)
[facility] DEPT Provider Note   CSN: [id number] Arrival date & time: 04/12/16  1531     History   Chief Complaint Chief Complaint  Patient presents with  . Flank Pain  . Abdominal Pain    HPI Katherine Fritz is a 35 y.o. female.  HPI   Left lower back pain radiating to pelvis, cramping pain lower abdomen which is constant, back pain comes and goes and is sharp Went to OB in July for abdominal pain, took out IUD in Aug because thought was infection October had tubes removed, then 2 weeks after the surgery, started to have pain in upper part of abdomen, went to [facility], thought maybe it was NSAID gastritis Last weekend pain became worse on lower left side, had scan done 2 days ago which showed no acute findings, thought maybe it was constipation, had diarrhea after receiving oral contrast. Now having more pain in back/side left lower side, been dealing with pain for "what feels like forever" Nausea too, feels like it is from pain. Had regular BM today, no blood No dysuria, some pressure with urination Was having clear discharge, no bleeding, no hx of STIs, not currently sexually active since prior to surgery, OB did pelvic exam    Past Medical History:  Diagnosis Date  . Abnormal Pap smear    Colposcopy normal  . Anemia   . Depression    Not on current treatment  . History of kidney stones   . Kidney stones 2009  . MRSA infection   . Ovarian cyst   . Vaginal Pap smear, abnormal     Patient Active Problem List   Diagnosis Date Noted  . Biliary colic 123456  . General counseling and advice on female contraception 03/14/2013  . Tinea pedis 11/08/2010  . MOOD SWINGS 09/03/2009  . ABNORMAL PAP SMEAR, LGSIL 08/09/2009  . OBESITY 07/11/2009  . RH FACTOR, NEGATIVE 05/12/2008  . ANEMIA, IRON DEFICIENCY, HX OF 04/07/2007  . DEPRESSIVE DISORDER, NOS 07/23/2006    Past Surgical History:  Procedure Laterality Date  . CHOLECYSTECTOMY    . COLPOSCOPY    . DENTAL  SURGERY    . LAPAROSCOPIC BILATERAL SALPINGECTOMY Bilateral 03/19/2016   Procedure: LAPAROSCOPIC BILATERAL SALPINGECTOMY;  Surgeon: Thurnell Lose, MD;  Location: [facility];  Service: Gynecology;  Laterality: Bilateral;    OB History    Gravida Para Term Preterm AB Living   4 4 4     4    SAB TAB Ectopic Multiple Live Births           4       Home Medications    Prior to Admission medications   Medication Sig Start Date End Date Taking? Authorizing Provider  acetaminophen (TYLENOL) 500 MG tablet Take 1,500 mg by mouth every 6 (six) hours as needed (pain).   Yes Historical Provider, MD  Levonorgestrel-Ethinyl Estradiol (AMETHIA,CAMRESE) 0.15-0.03 &0.01 MG tablet Take 1 tablet by mouth daily. 04/03/16  Yes Historical Provider, MD  pantoprazole (PROTONIX) 20 MG tablet Take 1 tablet (20 mg total) by mouth daily. 03/29/16  Yes Manya Silvas, CNM  traMADol (ULTRAM) 50 MG tablet Take 50 mg by mouth 3 (three) times daily as needed for pain. 04/10/16  Yes Historical Provider, MD  dicyclomine (BENTYL) 20 MG tablet Take 1 tablet (20 mg total) by mouth 2 (two) times daily. 04/12/16   Gareth Morgan, MD    Family History Family History  Problem Relation Age of Onset  . COPD Mother   .  Hypertension Mother   . Heart disease Mother   . Hyperlipidemia Sister   . Hypertension Sister   . Depression Brother   . Drug abuse Brother   . Cancer Paternal Aunt   . Cancer Paternal Uncle     Social History Social History  Substance Use Topics  . Smoking status: Former Smoker    Packs/day: 1.00    Types: Cigarettes    Quit date: 09/04/2008  . Smokeless tobacco: Never Used  . Alcohol use Yes     Allergies   Penicillins   Review of Systems Review of Systems  Constitutional: Negative for fever.  HENT: Negative for sore throat.   Eyes: Negative for visual disturbance.  Respiratory: Negative for cough and shortness of breath.   Cardiovascular: Negative for chest pain.  Gastrointestinal:  Positive for abdominal pain and nausea. Negative for constipation, diarrhea and vomiting.  Genitourinary: Positive for flank pain. Negative for difficulty urinating.  Musculoskeletal: Negative for back pain and neck pain.  Skin: Negative for rash.  Neurological: Negative for syncope and headaches.     Physical Exam Updated Vital Signs BP 132/86   Pulse 91   Temp 98 F (36.7 C) (Oral)   Resp 18   Ht 5\' 4"  (1.626 m)   Wt 230 lb (104.3 kg)   LMP 03/17/2016 (Exact Date)   SpO2 100%   BMI 39.48 kg/m   Physical Exam  Constitutional: She is oriented to person, place, and time. She appears well-developed and well-nourished. No distress.  HENT:  Head: Normocephalic and atraumatic.  Eyes: Conjunctivae and EOM are normal.  Neck: Normal range of motion.  Cardiovascular: Normal rate, regular rhythm, normal heart sounds and intact distal pulses.  Exam reveals no gallop and no friction rub.   No murmur heard. Pulmonary/Chest: Effort normal and breath sounds normal. No respiratory distress. She has no wheezes. She has no rales.  Abdominal: Soft. She exhibits no distension. There is tenderness in the left lower quadrant. There is CVA tenderness. There is no guarding.  Musculoskeletal: She exhibits no edema or tenderness.  Neurological: She is alert and oriented to person, place, and time.  Skin: Skin is warm and dry. No rash noted. She is not diaphoretic. No erythema.  Nursing note and vitals reviewed.    ED Treatments / Results  Labs (all labs ordered are listed, but only abnormal results are displayed) Labs Reviewed  LIPASE, BLOOD - Abnormal; Notable for the following:       Result Value   Lipase 52 (*)    All other components within normal limits  COMPREHENSIVE METABOLIC PANEL - Abnormal; Notable for the following:    Glucose, Bld 100 (*)    All other components within normal limits  CBC  URINALYSIS, ROUTINE W REFLEX MICROSCOPIC (NOT AT [facility])  POC URINE PREG, ED    EKG  EKG  Interpretation None       Radiology No results found.  Procedures Procedures (including critical care time)  Medications Ordered in ED Medications  ketorolac (TORADOL) injection 60 mg (60 mg Intramuscular Given 04/12/16 1818)     Initial Impression / Assessment and Plan / ED Course  I have reviewed the triage vital signs and the nursing notes.  Pertinent labs & imaging results that were available during my care of the patient were reviewed by me and considered in my medical decision making (see chart for details).  Clinical Course    34yo female with history of pelvic pain since July, had IUD  removed August for concern of infection, laparoscopic bilateral salpingectomy in October, CT abdomen pelvis done 2 days ago, who presents with concern for continuing pelvic pain.  Continuation of pain that was present at time of CT which showed no sign of nephrolithiasis, diverticulitis nor other acute pathology.  Had pelvic done previously with OBGYN and denies concern for new discharge or exposure and doubt PID. No UTI. Preg negative. Labs WNL. Discussed low suspicion for other acute intraabdominal pathology however recommend continued PCP/OBGYN follow up.  Given toradol and rx for bentyl for pain. Patient discharged in stable condition with understanding of reasons to return.   Final Clinical Impressions(s) / ED Diagnoses   Final diagnoses:  Pelvic pain  Chronic left-sided low back pain without sciatica    New Prescriptions Discharge Medication List as of 04/12/2016  6:08 PM    START taking these medications   Details  dicyclomine (BENTYL) 20 MG tablet Take 1 tablet (20 mg total) by mouth 2 (two) times daily., Starting Sat 04/12/2016, Print         Gareth Morgan, MD 04/13/16 1319

## 2016-05-03 ENCOUNTER — Other Ambulatory Visit: Payer: Self-pay | Admitting: Advanced Practice Midwife

## 2016-05-03 DIAGNOSIS — K2960 Other gastritis without bleeding: Secondary | ICD-10-CM

## 2016-05-03 DIAGNOSIS — T39395A Adverse effect of other nonsteroidal anti-inflammatory drugs [NSAID], initial encounter: Principal | ICD-10-CM

## 2017-04-10 IMAGING — CR DG HAND 2V*L*
2 series · 2 of 2 positions shown · non-contrast
Comparison: 09/30/2008 wrist films.

CLINICAL DATA: Pain at fourth finger for 2 months without injury.
Pain centered at the proximal interphalangeal joint.

EXAM:
LEFT HAND - 2 VIEW

[view not recorded (1 of 2)]
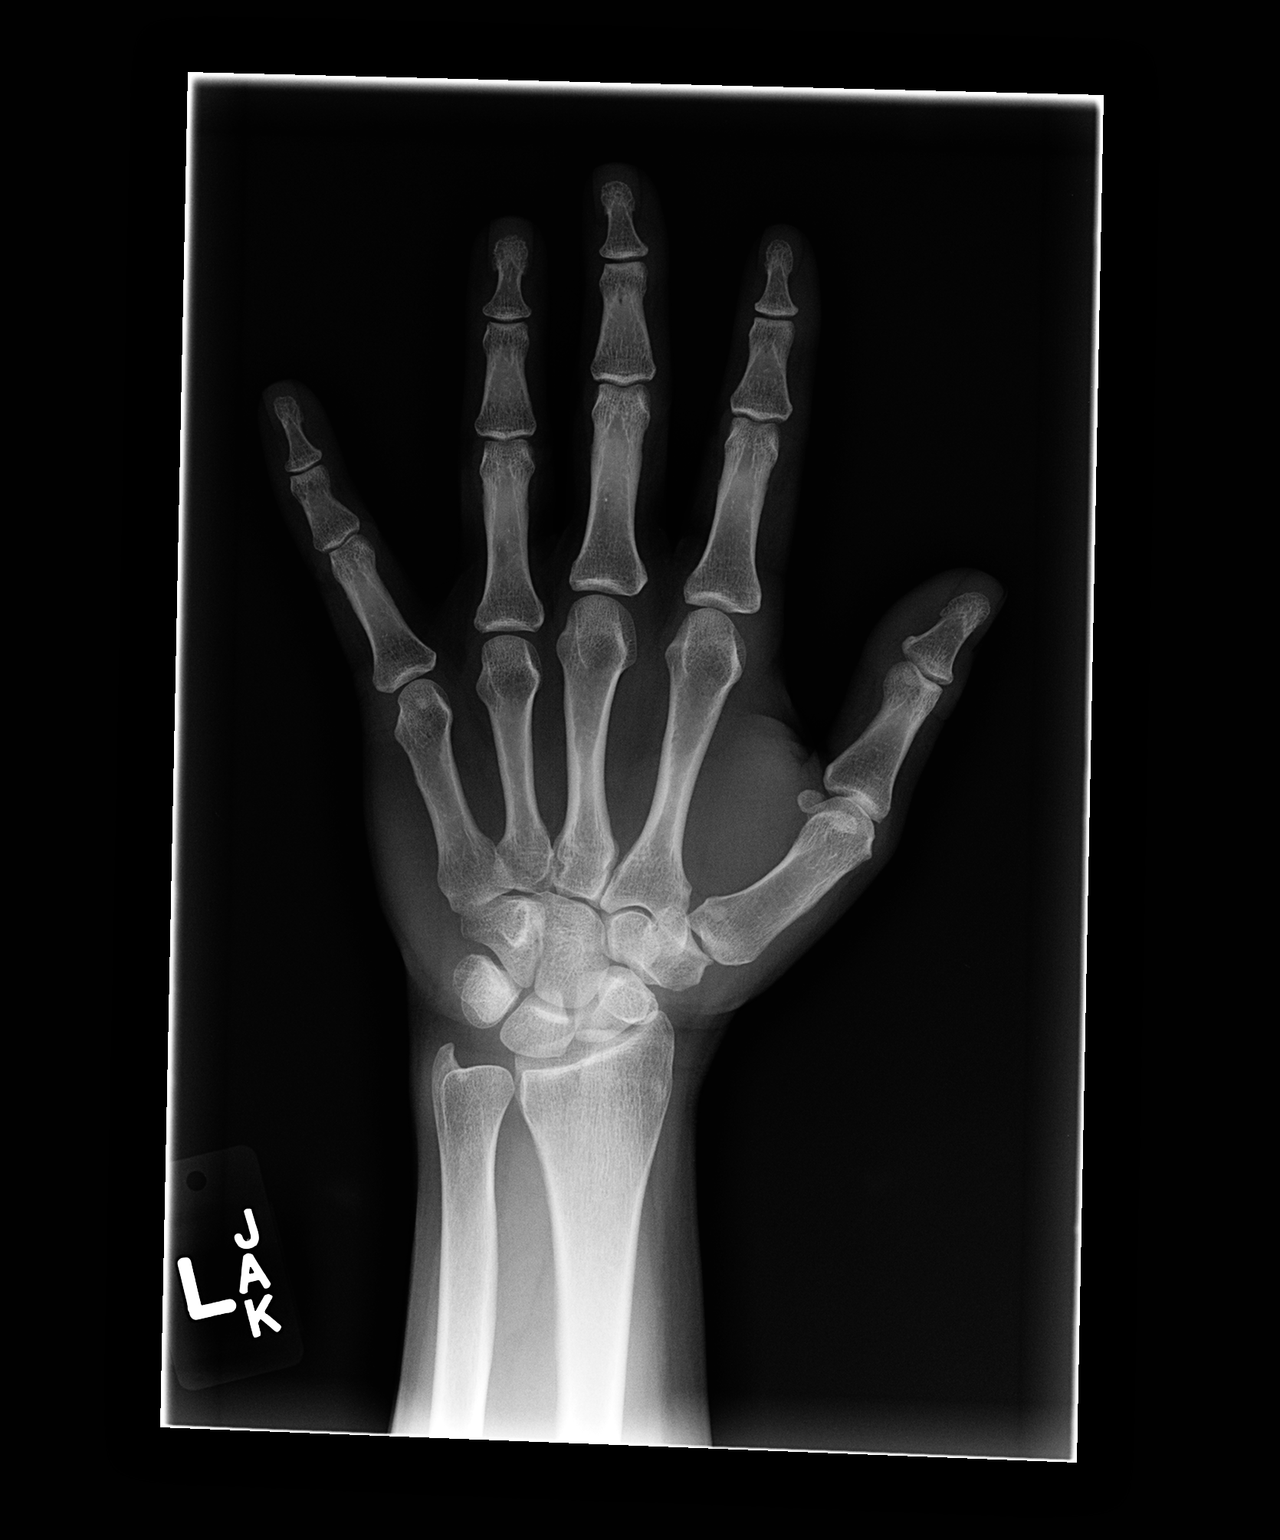

[view not recorded (2 of 2)]
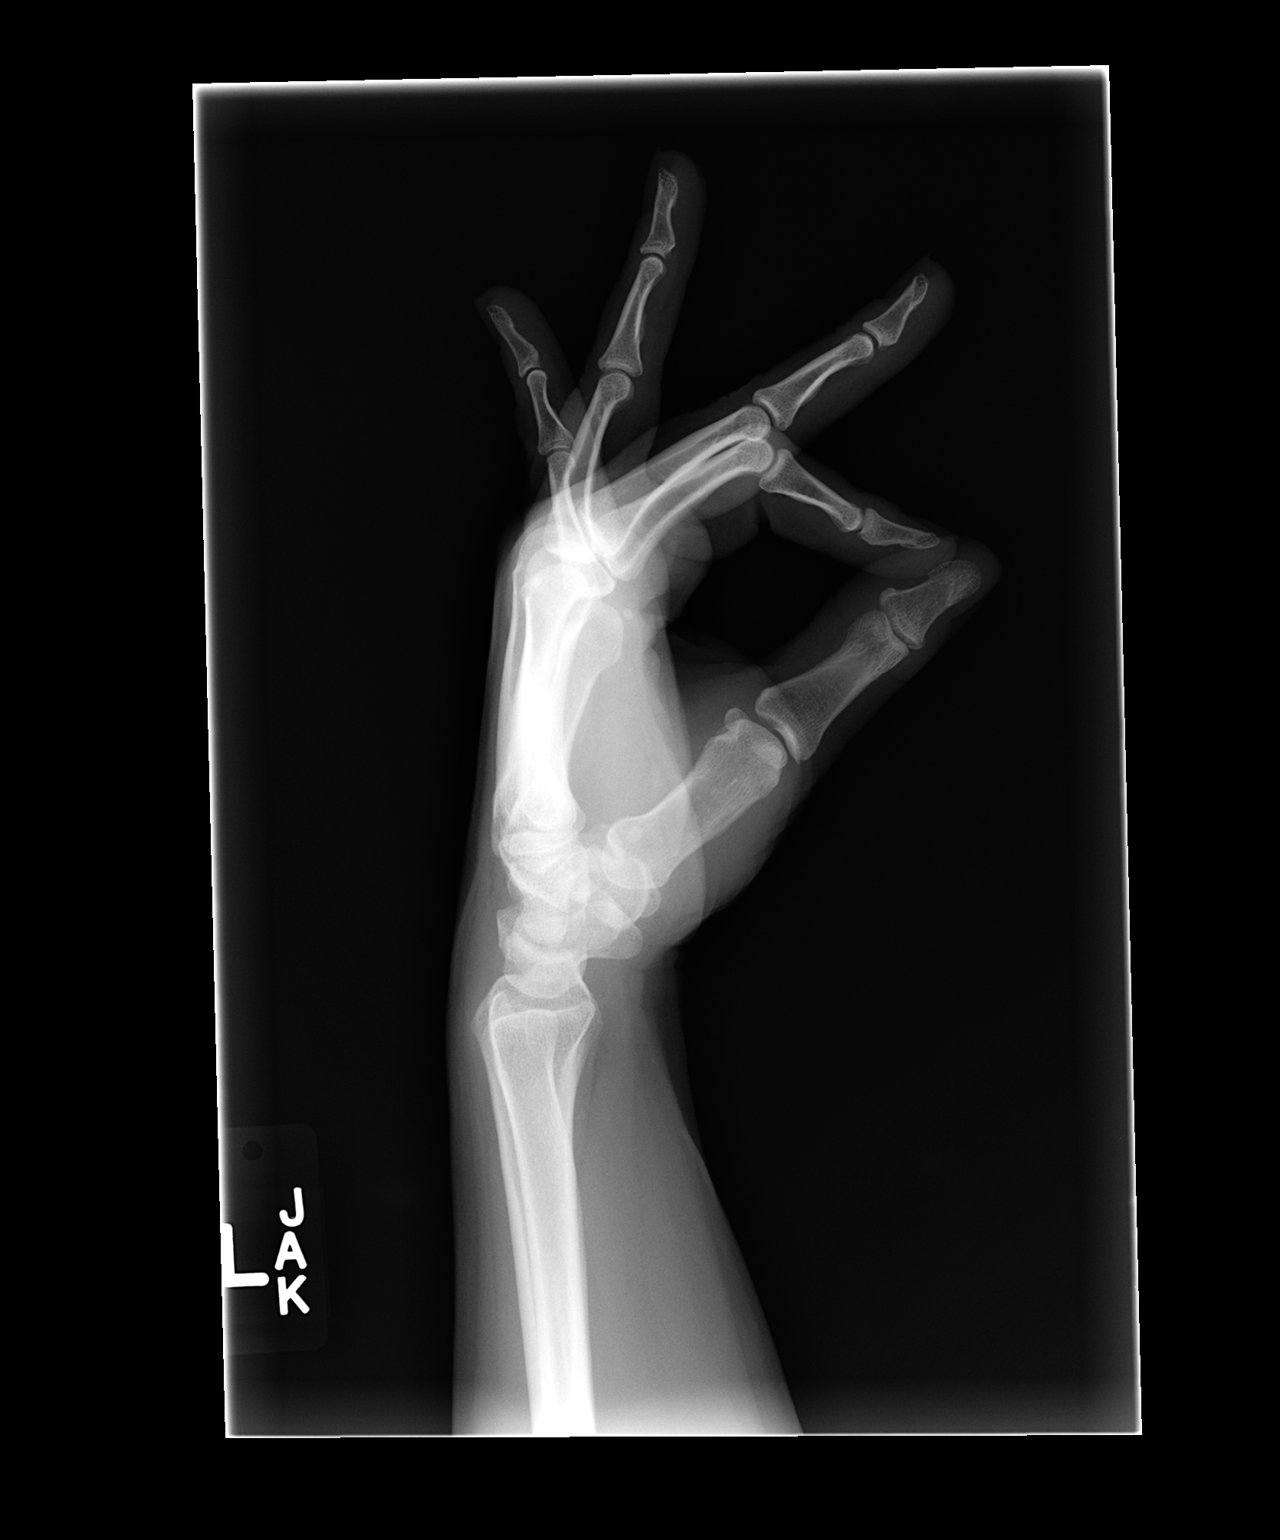

[2 of 2 positions shown; findings below may reference images not displayed]

FINDINGS: No acute fracture or dislocation. Joint spaces maintained. No focal
osseous lesion. No periarticular osteopenia. Ulnar styloid intact.
IMPRESSION: Normal left hand.

## 2017-05-13 ENCOUNTER — Other Ambulatory Visit: Payer: Self-pay | Admitting: Family Medicine

## 2017-05-13 ENCOUNTER — Ambulatory Visit
Admission: RE | Admit: 2017-05-13 | Discharge: 2017-05-13 | Disposition: A | Discharge status: Home / Self Care | Payer: BLUE CROSS/BLUE SHIELD | Source: Ambulatory Visit | Attending: Family Medicine | Admitting: Family Medicine

## 2017-05-13 DIAGNOSIS — R079 Chest pain, unspecified: Secondary | ICD-10-CM

## 2017-09-12 ENCOUNTER — Emergency Department (HOSPITAL_BASED_OUTPATIENT_CLINIC_OR_DEPARTMENT_OTHER): Payer: BLUE CROSS/BLUE SHIELD

## 2017-09-12 ENCOUNTER — Encounter (HOSPITAL_BASED_OUTPATIENT_CLINIC_OR_DEPARTMENT_OTHER): Payer: Self-pay | Admitting: Emergency Medicine

## 2017-09-12 ENCOUNTER — Emergency Department (HOSPITAL_BASED_OUTPATIENT_CLINIC_OR_DEPARTMENT_OTHER)
Admission: EM | Admit: 2017-09-12 | Discharge: 2017-09-12 | Disposition: A | Discharge status: Home / Self Care | Payer: BLUE CROSS/BLUE SHIELD | Attending: Emergency Medicine | Admitting: Emergency Medicine

## 2017-09-12 ENCOUNTER — Other Ambulatory Visit: Payer: Self-pay

## 2017-09-12 DIAGNOSIS — Z87891 Personal history of nicotine dependence: Secondary | ICD-10-CM | POA: Diagnosis not present

## 2017-09-12 DIAGNOSIS — R51 Headache: Secondary | ICD-10-CM | POA: Insufficient documentation

## 2017-09-12 DIAGNOSIS — E876 Hypokalemia: Secondary | ICD-10-CM | POA: Diagnosis not present

## 2017-09-12 DIAGNOSIS — Z79899 Other long term (current) drug therapy: Secondary | ICD-10-CM | POA: Diagnosis not present

## 2017-09-12 DIAGNOSIS — R112 Nausea with vomiting, unspecified: Secondary | ICD-10-CM | POA: Diagnosis not present

## 2017-09-12 DIAGNOSIS — M542 Cervicalgia: Secondary | ICD-10-CM | POA: Diagnosis not present

## 2017-09-12 DIAGNOSIS — R519 Headache, unspecified: Secondary | ICD-10-CM

## 2017-09-12 LAB — CBC WITH DIFFERENTIAL/PLATELET
Basophils Absolute: 0 10*3/uL (ref 0.0–0.1)
Basophils Relative: 0 %
Eosinophils Absolute: 0 10*3/uL (ref 0.0–0.7)
Eosinophils Relative: 0 %
HEMATOCRIT: 43.3 % (ref 36.0–46.0)
Hemoglobin: 14.6 g/dL (ref 12.0–15.0)
LYMPHS PCT: 5 %
Lymphs Abs: 0.6 10*3/uL — ABNORMAL LOW (ref 0.7–4.0)
MCH: 29.3 pg (ref 26.0–34.0)
MCHC: 33.7 g/dL (ref 30.0–36.0)
MCV: 86.8 fL (ref 78.0–100.0)
MONOS PCT: 5 %
Monocytes Absolute: 0.6 10*3/uL (ref 0.1–1.0)
NEUTROS ABS: 10.8 10*3/uL — AB (ref 1.7–7.7)
Neutrophils Relative %: 90 %
Platelets: 332 10*3/uL (ref 150–400)
RBC: 4.99 MIL/uL (ref 3.87–5.11)
RDW: 13.2 % (ref 11.5–15.5)
WBC: 12 10*3/uL — ABNORMAL HIGH (ref 4.0–10.5)

## 2017-09-12 LAB — LIPASE, BLOOD: LIPASE: 25 U/L (ref 11–51)

## 2017-09-12 LAB — COMPREHENSIVE METABOLIC PANEL
ALT: 18 U/L (ref 14–54)
ANION GAP: 13 (ref 5–15)
AST: 23 U/L (ref 15–41)
Albumin: 4.2 g/dL (ref 3.5–5.0)
Alkaline Phosphatase: 77 U/L (ref 38–126)
BILIRUBIN TOTAL: 0.6 mg/dL (ref 0.3–1.2)
BUN: 13 mg/dL (ref 6–20)
CO2: 23 mmol/L (ref 22–32)
Calcium: 8.7 mg/dL — ABNORMAL LOW (ref 8.9–10.3)
Chloride: 101 mmol/L (ref 101–111)
Creatinine, Ser: 0.83 mg/dL (ref 0.44–1.00)
Glucose, Bld: 106 mg/dL — ABNORMAL HIGH (ref 65–99)
POTASSIUM: 3.2 mmol/L — AB (ref 3.5–5.1)
Sodium: 137 mmol/L (ref 135–145)
Total Protein: 7.7 g/dL (ref 6.5–8.1)

## 2017-09-12 MED ORDER — DIPHENHYDRAMINE HCL 50 MG/ML IJ SOLN
25.0000 mg | Freq: Once | INTRAMUSCULAR | Status: AC
  Administered 2017-09-12: 25 mg via INTRAVENOUS
  Filled 2017-09-12: qty 1

## 2017-09-12 MED ORDER — POTASSIUM CHLORIDE CRYS ER 20 MEQ PO TBCR
40.0000 meq | EXTENDED_RELEASE_TABLET | Freq: Once | ORAL | Status: AC
  Administered 2017-09-12: 40 meq via ORAL
  Filled 2017-09-12: qty 2

## 2017-09-12 MED ORDER — BUTALBITAL-APAP-CAFFEINE 50-325-40 MG PO TABS: 1.0000 | ORAL_TABLET | Freq: Four times a day (QID) | ORAL | 0 refills | Status: DC | PRN

## 2017-09-12 MED ORDER — KETOROLAC TROMETHAMINE 15 MG/ML IJ SOLN
15.0000 mg | Freq: Once | INTRAMUSCULAR | Status: AC
Start: 2017-09-12 — End: 2017-09-12
  Administered 2017-09-12: 15 mg via INTRAVENOUS
  Filled 2017-09-12: qty 1

## 2017-09-12 MED ORDER — SODIUM CHLORIDE 0.9 % IV BOLUS
1000.0000 mL | Freq: Once | INTRAVENOUS | Status: AC
  Administered 2017-09-12: 1000 mL via INTRAVENOUS

## 2017-09-12 MED ORDER — METOCLOPRAMIDE HCL 5 MG/ML IJ SOLN
10.0000 mg | Freq: Once | INTRAMUSCULAR | Status: AC
  Administered 2017-09-12: 10 mg via INTRAVENOUS
  Filled 2017-09-12: qty 2

## 2017-09-12 NOTE — ED Notes (Signed)
Tolerated Po intake without any signs of nausea.

## 2017-09-12 NOTE — ED Notes (Signed)
Patient transported to CT 

## 2017-09-12 NOTE — ED Notes (Signed)
ED Provider at bedside. 

## 2017-09-12 NOTE — ED Triage Notes (Addendum)
Frontal headache x 3 weeks, radiates into L jaw. Vomited yesterday.

## 2017-09-12 NOTE — ED Provider Notes (Addendum)
[facility] EMERGENCY DEPARTMENT Provider Note   CSN: [id number] Arrival date & time: 09/12/17  1737     History   Chief Complaint Chief Complaint  Patient presents with  . Headache    HPI Katherine Fritz is a 37 y.o. female.  Patient presents to the emergency department today with complaint of headache ongoing over the past 3 weeks.  She describes headache every day, migrating over her head, usually frontal, sometimes on the left sometimes on the right.  She has been using tramadol and Tylenol at home without improvement.  She has had some photophobia and phonophobia.  She had left-sided neck pain last week but this is not persistent.  No fever or head injury reported. Patient denies signs of stroke including: facial droop, slurred speech, aphasia, weakness/numbness in extremities, imbalance/trouble walking.  Yesterday she began to have episodes of vomiting and was unable to keep down medications, fluids, food today prompting emergency department visit.  Patient does not typically get headaches and does not have a history of headaches.  Mother has migraines.  The onset of this condition was acute. The course is constant.       Past Medical History:  Diagnosis Date  . Abnormal Pap smear    Colposcopy normal  . Anemia   . Depression    Not on current treatment  . History of kidney stones   . Kidney stones 2009  . MRSA infection   . Ovarian cyst   . Vaginal Pap smear, abnormal     Patient Active Problem List   Diagnosis Date Noted  . Biliary colic 50/01/3817  . General counseling and advice on female contraception 03/14/2013  . Tinea pedis 11/08/2010  . MOOD SWINGS 09/03/2009  . ABNORMAL PAP SMEAR, LGSIL 08/09/2009  . OBESITY 07/11/2009  . RH FACTOR, NEGATIVE 05/12/2008  . ANEMIA, IRON DEFICIENCY, HX OF 04/07/2007  . DEPRESSIVE DISORDER, NOS 07/23/2006    Past Surgical History:  Procedure Laterality Date  . CHOLECYSTECTOMY    . COLPOSCOPY    . DENTAL  SURGERY    . LAPAROSCOPIC BILATERAL SALPINGECTOMY Bilateral 03/19/2016   Procedure: LAPAROSCOPIC BILATERAL SALPINGECTOMY;  Surgeon: Thurnell Lose, MD;  Location: [facility];  Service: Gynecology;  Laterality: Bilateral;     OB History    Gravida  4   Para  4   Term  4   Preterm      AB      Living  4     SAB      TAB      Ectopic      Multiple      Live Births  4            Home Medications    Prior to Admission medications   Medication Sig Start Date End Date Taking? Authorizing Provider  acetaminophen (TYLENOL) 500 MG tablet Take 1,500 mg by mouth every 6 (six) hours as needed (pain).    [provider]  dicyclomine (BENTYL) 20 MG tablet Take 1 tablet (20 mg total) by mouth 2 (two) times daily. 04/12/16   Gareth Morgan, MD  Levonorgestrel-Ethinyl Estradiol (AMETHIA,CAMRESE) 0.15-0.03 &0.01 MG tablet Take 1 tablet by mouth daily. 04/03/16   [provider]  pantoprazole (PROTONIX) 20 MG tablet TAKE 1 TABLET EVERY DAY 05/05/16   Tamala Julian, [place], CNM  traMADol (ULTRAM) 50 MG tablet Take 50 mg by mouth 3 (three) times daily as needed for pain. 04/10/16   [provider]    Family  History Family History  Problem Relation Age of Onset  . COPD Mother   . Hypertension Mother   . Heart disease Mother   . Hyperlipidemia Sister   . Hypertension Sister   . Depression Brother   . Drug abuse Brother   . Cancer Paternal Aunt   . Cancer Paternal Uncle     Social History Social History   Tobacco Use  . Smoking status: Former Smoker    Packs/day: 1.00    Types: Cigarettes    Last attempt to quit: 09/04/2008    Years since quitting: 9.0  . Smokeless tobacco: Never Used  Substance Use Topics  . Alcohol use: Yes  . Drug use: No     Allergies   Penicillins   Review of Systems Review of Systems  Constitutional: Negative for fever.  HENT: Negative for congestion, dental problem, rhinorrhea and sinus pressure.   Eyes: Positive for  photophobia. Negative for discharge, redness and visual disturbance.  Respiratory: Negative for shortness of breath.   Cardiovascular: Negative for chest pain.  Gastrointestinal: Positive for nausea and vomiting. Negative for abdominal pain.  Musculoskeletal: Negative for gait problem, neck pain and neck stiffness.  Skin: Negative for rash.  Neurological: Positive for headaches. Negative for syncope, speech difficulty, weakness, light-headedness and numbness.  Psychiatric/Behavioral: Negative for confusion.     Physical Exam Updated Vital Signs BP (!) 126/94 (BP Location: Right Arm)   Pulse (!) 122   Temp 99.7 F (37.6 C) (Oral)   Resp 20   Ht 5\' 4"  (1.626 m)   Wt 99.8 kg (220 lb)   LMP 09/01/2017   SpO2 100%   BMI 37.76 kg/m   Physical Exam  Constitutional: She is oriented to person, place, and time. She appears well-developed and well-nourished.  HENT:  Head: Normocephalic and atraumatic.  Right Ear: Tympanic membrane, external ear and ear canal normal.  Left Ear: Tympanic membrane, external ear and ear canal normal.  Nose: Nose normal.  Mouth/Throat: Uvula is midline, oropharynx is clear and moist and mucous membranes are normal.  Eyes: Pupils are equal, round, and reactive to light. Conjunctivae, EOM and lids are normal. Right eye exhibits no nystagmus. Left eye exhibits no nystagmus.  Neck: Normal range of motion. Neck supple.  Cardiovascular: Normal rate and regular rhythm.  Pulmonary/Chest: Effort normal and breath sounds normal. No respiratory distress. She has no wheezes. She has no rales.  Abdominal: Soft. There is no tenderness. There is no rebound and no guarding.  Musculoskeletal:       Cervical back: She exhibits normal range of motion, no tenderness and no bony tenderness.  Neurological: She is alert and oriented to person, place, and time. She has normal strength and normal reflexes. No cranial nerve deficit or sensory deficit. She displays a negative Romberg  sign. Coordination and gait normal. GCS eye subscore is 4. GCS verbal subscore is 5. GCS motor subscore is 6.  Skin: Skin is warm and dry.  Psychiatric: She has a normal mood and affect.  Nursing note and vitals reviewed.    ED Treatments / Results  Labs (all labs ordered are listed, but only abnormal results are displayed) Labs Reviewed  CBC WITH DIFFERENTIAL/PLATELET - Abnormal; Notable for the following components:      Result Value   WBC 12.0 (*)    Neutro Abs 10.8 (*)    Lymphs Abs 0.6 (*)    All other components within normal limits  COMPREHENSIVE METABOLIC PANEL - Abnormal; Notable for the  following components:   Potassium 3.2 (*)    Glucose, Bld 106 (*)    Calcium 8.7 (*)    All other components within normal limits  LIPASE, BLOOD    EKG EKG Interpretation  Date/Time:  Saturday September 12 2017 18:03:10 EDT Ventricular Rate:  125 PR Interval:  160 QRS Duration: 68 QT Interval:  302 QTC Calculation: 435 R Axis:   59 Text Interpretation:  Sinus tachycardia Otherwise normal ECG Confirmed by Lajean Saver [phone]) on 09/12/2017 10:18:40 PM   Radiology Ct Head Wo Contrast  Result Date: 09/12/2017 CLINICAL DATA:  Headache EXAM: CT HEAD WITHOUT CONTRAST TECHNIQUE: Contiguous axial images were obtained from the base of the skull through the vertex without intravenous contrast. COMPARISON:  None. FINDINGS: Brain: No acute intracranial abnormality. Specifically, no hemorrhage, hydrocephalus, mass lesion, acute infarction, or significant intracranial injury. Vascular: No hyperdense vessel or unexpected calcification. Skull: No acute calvarial abnormality. Sinuses/Orbits: Visualized paranasal sinuses and mastoids clear. Orbital soft tissues unremarkable. Other: None IMPRESSION: Normal study. Electronically Signed   By: Rolm Baptise M.D.   On: 09/12/2017 21:45    Procedures Procedures (including critical care time)  Medications Ordered in ED Medications  sodium chloride 0.9 %  bolus 1,000 mL (1,000 mLs Intravenous New Bag/Given 09/12/17 1955)  metoCLOPramide (REGLAN) injection 10 mg (has no administration in time range)  diphenhydrAMINE (BENADRYL) injection 25 mg (has no administration in time range)     Initial Impression / Assessment and Plan / ED Course  I have reviewed the triage vital signs and the nursing notes.  Pertinent labs & imaging results that were available during my care of the patient were reviewed by me and considered in my medical decision making (see chart for details).     Patient seen and examined. Work-up initiated. Medications ordered.   Vital signs reviewed and are as follows: BP (!) 126/94 (BP Location: Right Arm)   Pulse (!) 122   Temp 99.7 F (37.6 C) (Oral)   Resp 20   Ht 5\' 4"  (1.626 m)   Wt 99.8 kg (220 lb)   LMP 09/01/2017   SpO2 100%   BMI 37.76 kg/m   10:04 PM patient with only minimal improvement in symptoms with migraine cocktail.  Will obtain CT head as this is a new, atypical headache for the patient.  CT head was negative.  Toradol ordered.  Will reassess.  Pt updated on results. Potassium slightly low, will replete.   Patient discussed with and seen by Dr. Ashok Cordia. Agrees no further work-up at this point.   11:27 PM patient feeling better after Toradol.  Vital signs are improved, patient has not had any additional or further vomiting.  We will discharged home at this time.  Discussed need for PCP and/or neuro follow-up.  Referral given.  Patient counseled to return if they have worsening severe pain, persistent vomiting,  weakness in their arms or legs, slurred speech, trouble walking or talking, confusion, trouble with their balance, or if they have any other concerns. Patient verbalizes understanding and agrees with plan.    Final Clinical Impressions(s) / ED Diagnoses   Final diagnoses:  Bad headache  Hypokalemia   Patient with new headache x 3 weeks.  Minimal improvement with typical medications, head  CT ordered and is negative.  Without other high-risk features of headache including: sudden onset/thunderclap HA, altered mental status, accompanying seizure, headache with exertion, age > 66, history of immunocompromise, neck or shoulder pain, fever, use of anticoagulation, family history of  spontaneous SAH, concomitant drug use, toxic exposure. Doubt pseudotumor given no vision changes.   Patient has a normal complete neurological exam, normal vital signs, normal level of consciousness, no signs of meningismus, is well-appearing/non-toxic appearing, no signs of trauma.   Symptoms improved in the emergency department but underlying etiology is unclear at this point.  Do not feel that further workup in the ED is indicated, however patient will need to follow-up with her primary care physician or neurology referral.  Return instructions given if symptoms progress as above.  No dangerous or life-threatening conditions suspected or identified by history, physical exam, and by work-up. No indications for hospitalization identified.    ED Discharge Orders        Ordered    butalbital-acetaminophen-caffeine (FIORICET, ESGIC) 50-325-40 MG tablet  Every 6 hours PRN     09/12/17 2311         Carlisle Cater, Hershal Coria 09/12/17 2331    Lajean Saver, MD 09/16/17 1126

## 2017-09-12 NOTE — Discharge Instructions (Signed)
Please read and follow all provided instructions.  Your diagnoses today include:  1. Bad headache   2. Hypokalemia     Tests performed today include:  CT of your head which was normal and did not show any serious cause of your headache  Vital signs. See below for your results today.   Medications:  In the Emergency Department you received:  Reglan - antinausea/headache medication  Benadryl - antihistamine to counteract potential side effects of reglan  Toradol - NSAID medication similar to ibuprofen  Take any prescribed medications only as directed.  Additional information:  Follow any educational materials contained in this packet.  You are having a headache. No specific cause was found today for your headache. It may have been a migraine or other cause of headache. Stress, anxiety, fatigue, and depression are common triggers for headaches.   Your headache today does not appear to be life-threatening or require hospitalization, but often the exact cause of headaches is not determined in the emergency department. Therefore, follow-up with your doctor is very important to find out what may have caused your headache and whether or not you need any further diagnostic testing or treatment.   Sometimes headaches can appear benign (not harmful), but then more serious symptoms can develop which should prompt an immediate re-evaluation by your doctor or the emergency department.  BE VERY CAREFUL not to take multiple medicines containing Tylenol (also called acetaminophen). Doing so can lead to an overdose which can damage your liver and cause liver failure and possibly death.   Follow-up instructions: Please follow-up with your primary care provider or the neurology referral listed in the next 7 days for further evaluation of your symptoms.   Return instructions:   Please return to the Emergency Department if you experience worsening symptoms.  Return if the medications do not  resolve your headache, if it recurs, or if you have multiple episodes of vomiting or cannot keep down fluids.  Return if you have a change from the usual headache.  RETURN IMMEDIATELY IF you:  Develop a sudden, severe headache  Develop confusion or become poorly responsive or faint  Develop a fever above 100.40F or problem breathing  Have a change in speech, vision, swallowing, or understanding  Develop new weakness, numbness, tingling, incoordination in your arms or legs  Have a seizure  Please return if you have any other emergent concerns.  Additional Information:  Your vital signs today were: BP (!) 136/98 (BP Location: Right Wrist)    Pulse (!) 106    Temp 98.9 F (37.2 C) (Oral)    Resp 16    Ht 5\' 4"  (1.626 m)    Wt 99.8 kg (220 lb)    LMP 09/01/2017    SpO2 97%    BMI 37.76 kg/m  If your blood pressure (BP) was elevated above 135/85 this visit, please have this repeated by your doctor within one month. --------------

## 2017-10-28 ENCOUNTER — Encounter

## 2017-10-28 ENCOUNTER — Ambulatory Visit: Payer: BLUE CROSS/BLUE SHIELD | Admitting: Neurology

## 2017-10-28 ENCOUNTER — Encounter: Payer: Self-pay | Admitting: Neurology

## 2017-10-28 VITALS — BP 127/91 | HR 86 | Ht 64.0 in | Wt 222.2 lb

## 2017-10-28 DIAGNOSIS — IMO0002 Reserved for concepts with insufficient information to code with codable children: Secondary | ICD-10-CM | POA: Insufficient documentation

## 2017-10-28 DIAGNOSIS — G43709 Chronic migraine without aura, not intractable, without status migrainosus: Secondary | ICD-10-CM | POA: Diagnosis not present

## 2017-10-28 MED ORDER — SUMATRIPTAN SUCCINATE 100 MG PO TABS: 100.0000 mg | ORAL_TABLET | ORAL | 11 refills | Status: DC | PRN

## 2017-10-28 MED ORDER — NORTRIPTYLINE HCL 25 MG PO CAPS: 50.0000 mg | ORAL_CAPSULE | Freq: Every day | ORAL | 11 refills | Status: DC

## 2017-10-28 NOTE — Patient Instructions (Signed)
You may take  Sumatriptan 100 mg tablets as needed  In combination with Zofran for nausea Flexeril for muscle relaxant, Aleve 1 to 2 tablets,  Only treat to moderate to severe migraine headaches, stop daily medication use.  Start preventive medication nortriptyline 25 mg, titrating to 2 tablets every night,

## 2017-10-28 NOTE — Progress Notes (Signed)
PATIENT: Katherine Fritz DOB: 1981/03/14  Chief Complaint  Patient presents with  . Headache    ED on 38/20/19 for a persistent headache over three weeks.  Pain was accompanied by nausea, vomiting and light/noise sensitivity.  She had no relief with Tylenol or Tramadol prior to her hospital visit.  She had a normal head CT. She was prescribed Fioricet upon discharge.  PCP prescribed sumatriptan and later meloxicam.  These medications have not helped much.  She also has cyclobenzaprine.  Started topiramate two weeks ago and is taking 24m qhs.  She has history of repeat kidney stones.  .Marland KitchenPCP    WMaurice Small MD (referred from ED)      HISTORICAL  Katherine DULINGis a 37year old female, seen in refer by her primary care physician Dr. WMaurice Small for evaluation of migraine headaches, initial evaluation was on October 28, 2017.  She had a history of Wellbutrin, recurrent kidney stone, denies a previous history of headaches,  Since March 2019, without clear triggers, she began to have frequent headaches, often started at the right cervical region, spreading to right temporal frontal region, with severe pounding headache associated light noise smells sensitivity, lasting hours to days,  She has tried over-the-counter Excedrin Migraine, provide limited help, was given prescription of Fioricet, but because of frequent headaches, nearly every day, also tried tramadol 50 mg 2 tablets which provide some help, Imitrex subcutaneous injection at her primary care's office was helpful, now she is taking Imitrex 25 mg as needed, which provided partial relief,  But over the past 3 months, she has been taking daily medications for her daily headaches,  She presented to the emergency room on April 09/12/2017 for headache, nausea, CT head without contrast was normal, EKG was normal, Laboratory evaluation showed low potassium 3.2, mild elevated WBC   REVIEW OF SYSTEMS: Full 14 system review of systems  performed and notable only for thank you tingling in the ears, rash, blurred vision, easy bruising, skin sensitivity, headache, numbness, depression, decreased energy, disinterested in activities  ALLERGIES: Allergies  Allergen Reactions  . Penicillins Rash and Other (See Comments)    Has patient had a PCN reaction causing immediate rash, facial/tongue/throat swelling, SOB or lightheadedness with hypotension: No Has patient had a PCN reaction causing severe rash involving mucus membranes or skin necrosis: No Has patient had a PCN reaction that required hospitalization No Has patient had a PCN reaction occurring within the last 10 years: No If all of the above answers are "NO", then may proceed with Cephalosporin use.    HOME MEDICATIONS: Current Outpatient Medications  Medication Sig Dispense Refill  . acetaminophen-codeine (TYLENOL #3) 300-30 MG tablet Take 1 tablet by mouth every 8 (eight) hours as needed. for pain  0  . buPROPion (WELLBUTRIN XL) 300 MG 24 hr tablet Take 300 mg by mouth daily.    . butalbital-acetaminophen-caffeine (FIORICET, ESGIC) 50-325-40 MG tablet Take 1-2 tablets by mouth every 6 (six) hours as needed for headache. 10 tablet 0  . cyclobenzaprine (FLEXERIL) 10 MG tablet ONE BY MOUTH AT BEDTIME AS NEEDED  0  . SUMAtriptan (IMITREX) 25 MG tablet Take 25 mg by mouth every 2 (two) hours as needed for migraine. May repeat in 2 hours if headache persists or recurs.    . tamsulosin (FLOMAX) 0.4 MG CAPS capsule Take 0.4 mg by mouth as needed.    . topiramate (TOPAMAX) 25 MG tablet ONE BY MOUTH DAILY AND AFTER 3 WEEKS INCREASE TO  2 BY MOUTH DAILY  0  . traMADol (ULTRAM) 50 MG tablet Take 50 mg by mouth 3 (three) times daily as needed for pain.  0  . triamcinolone (KENALOG) 0.025 % ointment Apply 1 application topically as needed.    . valACYclovir (VALTREX) 1000 MG tablet Take 1,000 mg by mouth as needed.     No current facility-administered medications for this visit.      PAST MEDICAL HISTORY: Past Medical History:  Diagnosis Date  . Abnormal Pap smear    Colposcopy normal  . Anemia   . Depression    Not on current treatment  . Headache   . History of kidney stones   . Kidney stones 2009  . MRSA infection   . Ovarian cyst   . Vaginal Pap smear, abnormal     PAST SURGICAL HISTORY: Past Surgical History:  Procedure Laterality Date  . CHOLECYSTECTOMY    . COLPOSCOPY    . DENTAL SURGERY    . LAPAROSCOPIC BILATERAL SALPINGECTOMY Bilateral 03/19/2016   Procedure: LAPAROSCOPIC BILATERAL SALPINGECTOMY;  Surgeon: Thurnell Lose, MD;  Location: [facility];  Service: Gynecology;  Laterality: Bilateral;    FAMILY HISTORY: Family History  Problem Relation Age of Onset  . COPD Mother   . Hypertension Mother   . Heart disease Mother   . Lung cancer Mother   . Hyperlipidemia Sister   . Hypertension Sister   . Depression Brother   . Drug abuse Brother   . Other Father        unsure of birth father's history  . Multiple myeloma Maternal Aunt   . Cancer Maternal Uncle     SOCIAL HISTORY:  Social History   Socioeconomic History  . Marital status: Married    Spouse name: Not on file  . Number of children: 4  . Years of education: 59  . Highest education level: Associate degree: occupational, Hotel manager, or vocational program  Occupational History  . 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 Financial resource strain: Not on file  . Food insecurity:    Worry: Not on file    Inability: Not on file  . Transportation needs:    Medical: Not on file    Non-medical: Not on file  Tobacco Use  . Smoking status: Former Smoker    Packs/day: 1.00    Types: Cigarettes    Last attempt to quit: 11/2008    Years since quitting: 8.9  . Smokeless tobacco: Never Used  Substance and Sexual Activity  . Alcohol use: Yes    Comment: occasionally (1-2 drinks per month)  . Drug use: No  . Sexual activity: Not Currently    Birth  control/protection: Surgical  Lifestyle  . Physical activity:    Days per week: Not on file    Minutes per session: Not on file  . Stress: Not on file  Relationships  . Social connections:    Talks on phone: Not on file    Gets together: Not on file    Attends religious service: Not on file    Active member of club or organization: Not on file    Attends meetings of clubs or organizations: Not on file    Relationship status: Not on file  . Intimate partner violence:    Fear of current or ex partner: Not on file    Emotionally abused: Not on file    Physically abused: Not on file    Forced sexual activity: Not  on file  Other Topics Concern  . Not on file  Social History Narrative   Lives at home with husband and children.   Right-handed.   1-2 cups caffeine per day.     PHYSICAL EXAM   Vitals:   10/28/17 1023  BP: (!) 127/91  Pulse: 86  Weight: 222 lb 4 oz (100.8 kg)  Height: 5' 4" (1.626 m)    Not recorded      Body mass index is 38.15 kg/m.  PHYSICAL EXAMNIATION:  Gen: NAD, conversant, well nourised, obese, well groomed                     Cardiovascular: Regular rate rhythm, no peripheral edema, warm, nontender. Eyes: Conjunctivae clear without exudates or hemorrhage Neck: Supple, no carotid bruits. Pulmonary: Clear to auscultation bilaterally   NEUROLOGICAL EXAM:  MENTAL STATUS: Speech:    Speech is normal; fluent and spontaneous with normal comprehension.  Cognition:     Orientation to time, place and person     Normal recent and remote memory     Normal Attention span and concentration     Normal Language, naming, repeating,spontaneous speech     Fund of knowledge   CRANIAL NERVES: CN II: Visual fields are full to confrontation. Fundoscopic exam is normal with sharp discs and no vascular changes. Pupils are round equal and briskly reactive to light. CN III, IV, VI: extraocular movement are normal. No ptosis. CN V: Facial sensation is intact to  pinprick in all 3 divisions bilaterally. Corneal responses are intact.  CN VII: Face is symmetric with normal eye closure and smile. CN VIII: Hearing is normal to rubbing fingers CN IX, X: Palate elevates symmetrically. Phonation is normal. CN XI: Head turning and shoulder shrug are intact CN XII: Tongue is midline with normal movements and no atrophy.  MOTOR: There is no pronator drift of out-stretched arms. Muscle bulk and tone are normal. Muscle strength is normal.  REFLEXES: Reflexes are 2+ and symmetric at the biceps, triceps, knees, and ankles. Plantar responses are flexor.  SENSORY: Intact to light touch, pinprick, positional sensation and vibratory sensation are intact in fingers and toes.  COORDINATION: Rapid alternating movements and fine finger movements are intact. There is no dysmetria on finger-to-nose and heel-knee-shin.    GAIT/STANCE: Posture is normal. Gait is steady with normal steps, base, arm swing, and turning. Heel and toe walking are normal. Tandem gait is normal.  Romberg is absent.   DIAGNOSTIC DATA (LABS, IMAGING, TESTING) - I reviewed patient records, labs, notes, testing and imaging myself where available.   ASSESSMENT AND PLAN  Katherine Fritz is a 37 y.o. female    Chronic migraine headaches History of kidney stone  Component of medicine rebound headaches  Stopped Topamax  Nortriptyline 25 mg titrating to 50 mg every night as migraine prevention  Imitrex 100 mg as needed, may combine it together with Zofran, NSAIDs, Flexeril for protracted severe migraine headaches   return to clinic with nurse practitioner in 2 to 3 months    Marcial Pacas, M.D. Ph.D.  [facility] [address] [place], [place] [address] Ph: [phone] Fax: [phone]  CC: Maurice Small, MD

## 2017-11-19 ENCOUNTER — Other Ambulatory Visit: Payer: Self-pay | Admitting: Neurology

## 2017-12-03 ENCOUNTER — Emergency Department (HOSPITAL_COMMUNITY): Payer: BLUE CROSS/BLUE SHIELD

## 2017-12-03 ENCOUNTER — Other Ambulatory Visit: Payer: Self-pay

## 2017-12-03 ENCOUNTER — Emergency Department (HOSPITAL_COMMUNITY)
Admission: EM | Admit: 2017-12-03 | Discharge: 2017-12-03 | Disposition: A | Discharge status: Home / Self Care | Payer: BLUE CROSS/BLUE SHIELD | Attending: Emergency Medicine | Admitting: Emergency Medicine

## 2017-12-03 ENCOUNTER — Encounter (HOSPITAL_COMMUNITY): Payer: Self-pay

## 2017-12-03 DIAGNOSIS — Z87442 Personal history of urinary calculi: Secondary | ICD-10-CM | POA: Diagnosis not present

## 2017-12-03 DIAGNOSIS — R110 Nausea: Secondary | ICD-10-CM | POA: Diagnosis not present

## 2017-12-03 DIAGNOSIS — D259 Leiomyoma of uterus, unspecified: Secondary | ICD-10-CM

## 2017-12-03 DIAGNOSIS — Z8614 Personal history of Methicillin resistant Staphylococcus aureus infection: Secondary | ICD-10-CM | POA: Diagnosis not present

## 2017-12-03 DIAGNOSIS — R1084 Generalized abdominal pain: Secondary | ICD-10-CM

## 2017-12-03 DIAGNOSIS — Z87891 Personal history of nicotine dependence: Secondary | ICD-10-CM | POA: Insufficient documentation

## 2017-12-03 LAB — CBC WITH DIFFERENTIAL/PLATELET
BASOS ABS: 0 10*3/uL (ref 0.0–0.1)
Basophils Relative: 0 %
EOS ABS: 0.2 10*3/uL (ref 0.0–0.7)
EOS PCT: 2 %
HCT: 41 % (ref 36.0–46.0)
Hemoglobin: 13.5 g/dL (ref 12.0–15.0)
LYMPHS PCT: 23 %
Lymphs Abs: 1.9 10*3/uL (ref 0.7–4.0)
MCH: 29.3 pg (ref 26.0–34.0)
MCHC: 32.9 g/dL (ref 30.0–36.0)
MCV: 88.9 fL (ref 78.0–100.0)
MONOS PCT: 7 %
Monocytes Absolute: 0.6 10*3/uL (ref 0.1–1.0)
NEUTROS PCT: 68 %
Neutro Abs: 5.7 10*3/uL (ref 1.7–7.7)
PLATELETS: 368 10*3/uL (ref 150–400)
RBC: 4.61 MIL/uL (ref 3.87–5.11)
RDW: 13.3 % (ref 11.5–15.5)
WBC: 8.3 10*3/uL (ref 4.0–10.5)

## 2017-12-03 LAB — URINALYSIS, ROUTINE W REFLEX MICROSCOPIC
Bilirubin Urine: NEGATIVE
GLUCOSE, UA: NEGATIVE mg/dL
HGB URINE DIPSTICK: NEGATIVE
Ketones, ur: NEGATIVE mg/dL
Leukocytes, UA: NEGATIVE
Nitrite: NEGATIVE
Protein, ur: NEGATIVE mg/dL
SPECIFIC GRAVITY, URINE: 1.043 — AB (ref 1.005–1.030)
pH: 5 (ref 5.0–8.0)

## 2017-12-03 LAB — COMPREHENSIVE METABOLIC PANEL
ALBUMIN: 4 g/dL (ref 3.5–5.0)
ALT: 16 U/L (ref 0–44)
AST: 23 U/L (ref 15–41)
Alkaline Phosphatase: 72 U/L (ref 38–126)
Anion gap: 8 (ref 5–15)
BUN: 9 mg/dL (ref 6–20)
CO2: 29 mmol/L (ref 22–32)
CREATININE: 0.79 mg/dL (ref 0.44–1.00)
Calcium: 9.3 mg/dL (ref 8.9–10.3)
Chloride: 103 mmol/L (ref 98–111)
GFR calc Af Amer: >60 mL/min (ref >60)
GFR calc non Af Amer: >60 mL/min (ref >60)
Glucose, Bld: 89 mg/dL (ref 70–99)
Potassium: 3.7 mmol/L (ref 3.5–5.1)
SODIUM: 140 mmol/L (ref 135–145)
Total Bilirubin: 0.3 mg/dL (ref 0.3–1.2)
Total Protein: 7.4 g/dL (ref 6.5–8.1)

## 2017-12-03 LAB — LIPASE, BLOOD: Lipase: 30 U/L (ref 11–51)

## 2017-12-03 LAB — I-STAT BETA HCG BLOOD, ED (MC, WL, AP ONLY): I-stat hCG, quantitative: <5 m[IU]/mL (ref <5)

## 2017-12-03 MED ORDER — IOPAMIDOL (ISOVUE-300) INJECTION 61%
100.0000 mL | Freq: Once | INTRAVENOUS | Status: AC | PRN
  Administered 2017-12-03: 100 mL via INTRAVENOUS

## 2017-12-03 MED ORDER — ONDANSETRON HCL 4 MG/2ML IJ SOLN
4.0000 mg | Freq: Once | INTRAMUSCULAR | Status: AC
  Administered 2017-12-03: 4 mg via INTRAVENOUS
  Filled 2017-12-03: qty 2

## 2017-12-03 MED ORDER — IOPAMIDOL (ISOVUE-300) INJECTION 61%
INTRAVENOUS | Status: AC
  Filled 2017-12-03: qty 100

## 2017-12-03 MED ORDER — ONDANSETRON 4 MG PO TBDP: 4.0000 mg | ORAL_TABLET | Freq: Three times a day (TID) | ORAL | 0 refills | Status: DC | PRN

## 2017-12-03 MED ORDER — MORPHINE SULFATE (PF) 4 MG/ML IV SOLN
4.0000 mg | Freq: Once | INTRAVENOUS | Status: AC
  Administered 2017-12-03: 4 mg via INTRAVENOUS
  Filled 2017-12-03: qty 1

## 2017-12-03 MED ORDER — HYDROMORPHONE HCL 1 MG/ML IJ SOLN
0.5000 mg | Freq: Once | INTRAMUSCULAR | Status: AC
  Administered 2017-12-03: 0.5 mg via INTRAVENOUS
  Filled 2017-12-03: qty 1

## 2017-12-03 MED ORDER — SODIUM CHLORIDE 0.9 % IV BOLUS
1000.0000 mL | Freq: Once | INTRAVENOUS | Status: AC
  Administered 2017-12-03: 1000 mL via INTRAVENOUS

## 2017-12-03 NOTE — ED Notes (Signed)
Pt has been asked at this time to obtain a urine sample, pt states can't maybe in a little while.

## 2017-12-03 NOTE — ED Provider Notes (Signed)
[facility] DEPT Provider Note   CSN: [id number] Arrival date & time: 12/03/17  1353     History   Chief Complaint Chief Complaint  Patient presents with  . Abdominal Pain    HPI Katherine Fritz is a 37 y.o. female.  37 year old female presents with complaint of abdominal pain.  Patient states that she started with left-sided abdominal pain 2 to 3 weeks ago, has been on dull pain, constant, worsened since yesterday.  Patient states that she was sitting at work yesterday when her pain became significantly worse, nothing makes her pain better or worse, sharp in nature.  Now having right lower abdominal pain as well.  Reports feeling nauseous, no vomiting.  Reports loose nonbloody stools today.  Denies fevers or chills.  Previous abdominal surgeries include cholecystectomy and bilateral salpingectomy.  Patient states that she has had kidney stones in the past however her symptoms are different today, also reports history of ovarian cysts.  Patient went to her PCP today and was sent to the ER.  No other complaints or concerns.     Past Medical History:  Diagnosis Date  . Abnormal Pap smear    Colposcopy normal  . Anemia   . Depression    Not on current treatment  . Headache   . History of kidney stones   . Kidney stones 2009  . MRSA infection   . Ovarian cyst   . Vaginal Pap smear, abnormal     Patient Active Problem List   Diagnosis Date Noted  . Chronic migraine 10/28/2017  . Biliary colic 31/54/0086  . General counseling and advice on female contraception 03/14/2013  . Tinea pedis 11/08/2010  . MOOD SWINGS 09/03/2009  . ABNORMAL PAP SMEAR, LGSIL 08/09/2009  . OBESITY 07/11/2009  . RH FACTOR, NEGATIVE 05/12/2008  . ANEMIA, IRON DEFICIENCY, HX OF 04/07/2007  . DEPRESSIVE DISORDER, NOS 07/23/2006    Past Surgical History:  Procedure Laterality Date  . CHOLECYSTECTOMY    . COLPOSCOPY    . DENTAL SURGERY    . LAPAROSCOPIC BILATERAL  SALPINGECTOMY Bilateral 03/19/2016   Procedure: LAPAROSCOPIC BILATERAL SALPINGECTOMY;  Surgeon: Thurnell Lose, MD;  Location: [facility];  Service: Gynecology;  Laterality: Bilateral;     OB History    Gravida  4   Para  4   Term  4   Preterm      AB      Living  4     SAB      TAB      Ectopic      Multiple      Live Births  4            Home Medications    Prior to Admission medications   Medication Sig Start Date End Date Taking? Authorizing Provider  aspirin-acetaminophen-caffeine (EXCEDRIN MIGRAINE) [phone] MG tablet Take 1 tablet by mouth every 6 (six) hours as needed for headache or migraine.   Yes [provider]  buPROPion (WELLBUTRIN XL) 300 MG 24 hr tablet Take 300 mg by mouth daily.   Yes [provider]  cyclobenzaprine (FLEXERIL) 10 MG tablet Take 10 mg by mouth 3 (three) times daily as needed for muscle spasms.   Yes [provider]  Multiple Vitamins-Minerals (MULTIVITAMIN ADULT PO) Take 1 tablet by mouth daily.   Yes [provider]  mupirocin ointment (BACTROBAN) 2 % Apply 1 application topically 2 (two) times daily as needed (eczema).   Yes [provider]  nortriptyline (PAMELOR) 25 MG capsule TAKE 2 CAPSULES (50 MG TOTAL) BY MOUTH AT BEDTIME. 11/19/17  Yes Marcial Pacas, MD  ondansetron (ZOFRAN) 8 MG tablet Take 8 mg by mouth every 8 (eight) hours as needed for nausea or vomiting.   Yes [provider]  oxyCODONE (OXY IR/ROXICODONE) 5 MG immediate release tablet Take 5-10 mg by mouth every 6 (six) hours as needed for pain. 12/01/17  Yes [provider]  penciclovir (DENAVIR) 1 % cream Apply 1 application topically daily as needed (cold sores).   Yes [provider]  SUMAtriptan (IMITREX) 100 MG tablet Take 1 tablet (100 mg total) by mouth every 2 (two) hours as needed for migraine. May repeat in 2 hours if headache persists or recurs. 10/28/17  Yes Marcial Pacas, MD  tamsulosin (FLOMAX)  0.4 MG CAPS capsule Take 0.4 mg by mouth as needed (kidney stone).    Yes [provider]  traMADol (ULTRAM) 50 MG tablet Take 50 mg by mouth 3 (three) times daily as needed for moderate pain.  04/10/16  Yes [provider]  triamcinolone (KENALOG) 0.025 % ointment Apply 1 application topically as needed (eczema).    Yes [provider]  valACYclovir (VALTREX) 1000 MG tablet Take 1,000 mg by mouth as needed (cold sores).    Yes [provider]  ondansetron (ZOFRAN ODT) 4 MG disintegrating tablet Take 1 tablet (4 mg total) by mouth every 8 (eight) hours as needed for nausea or vomiting. 12/03/17   Tacy Learn, PA-C    Family History Family History  Problem Relation Age of Onset  . COPD Mother   . Hypertension Mother   . Heart disease Mother   . Lung cancer Mother   . Hyperlipidemia Sister   . Hypertension Sister   . Depression Brother   . Drug abuse Brother   . Other Father        unsure of birth father's history  . Multiple myeloma Maternal Aunt   . Cancer Maternal Uncle     Social History Social History   Tobacco Use  . Smoking status: Former Smoker    Packs/day: 1.00    Types: Cigarettes    Last attempt to quit: 11/2008    Years since quitting: 9.0  . Smokeless tobacco: Never Used  Substance Use Topics  . Alcohol use: Yes    Comment: occasionally (1-2 drinks per month)  . Drug use: No     Allergies   Penicillins   Review of Systems Review of Systems  Constitutional: Negative for appetite change, chills and fever.  Respiratory: Negative for shortness of breath.   Cardiovascular: Negative for chest pain.  Gastrointestinal: Positive for abdominal pain, diarrhea and nausea. Negative for blood in stool, constipation and vomiting.  Genitourinary: Negative for dysuria, frequency, urgency and vaginal discharge.  Musculoskeletal: Negative for arthralgias, back pain and myalgias.  Skin: Negative for rash and wound.    Allergic/Immunologic: Negative for immunocompromised state.  Neurological: Negative for weakness.  Hematological: Does not bruise/bleed easily.  Psychiatric/Behavioral: Negative for confusion.  All other systems reviewed and are negative.    Physical Exam Updated Vital Signs BP 128/85 (BP Location: Left Arm)   Pulse 99   Temp 97.7 F (36.5 C) (Oral)   Resp 18   Ht '5\' 4"'  (1.626 m)   Wt 104.3 kg (230 lb)   LMP 11/26/2017   SpO2 100%   BMI 39.48 kg/m   Physical Exam  Constitutional: She is oriented to person, place,  and time. She appears well-developed and well-nourished.  Appears uncomfortable, legs bouncing on the bed  HENT:  Head: Normocephalic and atraumatic.  Eyes: Pupils are equal, round, and reactive to light.  Cardiovascular: Regular rhythm. Tachycardia present.  No murmur heard. Pulmonary/Chest: Effort normal.  Abdominal: Normal appearance and bowel sounds are normal. There is tenderness in the right lower quadrant and left upper quadrant. There is no CVA tenderness.  Neurological: She is alert and oriented to person, place, and time.  Skin: Skin is warm and dry. Capillary refill takes less than 2 seconds. No rash noted.  Psychiatric: She has a normal mood and affect. Her behavior is normal.  Nursing note and vitals reviewed.    ED Treatments / Results  Labs (all labs ordered are listed, but only abnormal results are displayed) Labs Reviewed  URINALYSIS, ROUTINE W REFLEX MICROSCOPIC - Abnormal; Notable for the following components:      Result Value   Specific Gravity, Urine 1.043 (*)    All other components within normal limits  CBC WITH DIFFERENTIAL/PLATELET  COMPREHENSIVE METABOLIC PANEL  LIPASE, BLOOD  I-STAT BETA HCG BLOOD, ED ([facility], [facility], [facility] ONLY)    EKG None  Radiology US Transvaginal Non-ob  Result Date: 12/03/2017 CLINICAL DATA:  37 year old G4 P4, LMP 11/26/2017, presenting with 2 week history of LEFT greater than RIGHT pelvic pain. EXAM:  TRANSABDOMINAL AND TRANSVAGINAL ULTRASOUND OF PELVIS DOPPLER ULTRASOUND OF OVARIES TECHNIQUE: Both transabdominal and transvaginal ultrasound examinations of the pelvis were performed. Transabdominal technique was performed for global imaging of the pelvis including uterus, ovaries, adnexal regions, and pelvic cul-de-sac. It was necessary to proceed with endovaginal exam following the transabdominal exam to visualize the endometrium and ovaries. Color and duplex Doppler ultrasound was utilized to evaluate blood flow to the ovaries. COMPARISON:  Pelvic ultrasound 12/14/2014, 02/08/2014. CT abdomen and pelvis earlier today, 04/10/2016 and earlier. FINDINGS: Uterus Measurements: Approximately 8.1 x 6.1 x 5.3 cm. Subserosal fibroid arising from the LEFT LATERAL uterine body measuring approximately 3.4 x 2.5 x 2.8 cm, increased in size since the pelvic ultrasound 12/14/2014. No other visible fibroids. Endometrium Thickness: 6 mm. Normal appearance without evidence of endometrial fluid or mass. Right ovary Measurements: Approximately 3.1 x 3.2 x 2.1 cm. Dominant simple cyst measuring approximately 2.3 x 2.3 x 2.1 cm. Smaller follicular cysts. No solid mass. Normal color Doppler flow. Left ovary Measurements: Approximately 3.4 x 1.6 x 2.1 cm. Small follicular cysts. No dominant cyst or solid mass. Normal color Doppler flow. Pulsed Doppler evaluation of both ovaries demonstrates normal low-resistance arterial and venous waveforms. Other findings No free pelvic fluid. IMPRESSION: 1. Approximate 2 cm cyst arising from the RIGHT ovary. No free fluid to suggest cyst rupture. 2. Subserosal fibroid arising from the LEFT LATERAL uterine body, maximum measurement 3.4 cm, increased in size since 2016. No other visible fibroids. 3. Otherwise normal examination. Electronically Signed   By: Evangeline Dakin M.D.   On: 12/03/2017 16:46   US Pelvis Complete  Result Date: 12/03/2017 CLINICAL DATA:  37 year old G4 P4, LMP 11/26/2017,  presenting with 2 week history of LEFT greater than RIGHT pelvic pain. EXAM: TRANSABDOMINAL AND TRANSVAGINAL ULTRASOUND OF PELVIS DOPPLER ULTRASOUND OF OVARIES TECHNIQUE: Both transabdominal and transvaginal ultrasound examinations of the pelvis were performed. Transabdominal technique was performed for global imaging of the pelvis including uterus, ovaries, adnexal regions, and pelvic cul-de-sac. It was necessary to proceed with endovaginal exam following the transabdominal exam to visualize the endometrium and ovaries. Color and duplex Doppler ultrasound was  utilized to evaluate blood flow to the ovaries. COMPARISON:  Pelvic ultrasound 12/14/2014, 02/08/2014. CT abdomen and pelvis earlier today, 04/10/2016 and earlier. FINDINGS: Uterus Measurements: Approximately 8.1 x 6.1 x 5.3 cm. Subserosal fibroid arising from the LEFT LATERAL uterine body measuring approximately 3.4 x 2.5 x 2.8 cm, increased in size since the pelvic ultrasound 12/14/2014. No other visible fibroids. Endometrium Thickness: 6 mm. Normal appearance without evidence of endometrial fluid or mass. Right ovary Measurements: Approximately 3.1 x 3.2 x 2.1 cm. Dominant simple cyst measuring approximately 2.3 x 2.3 x 2.1 cm. Smaller follicular cysts. No solid mass. Normal color Doppler flow. Left ovary Measurements: Approximately 3.4 x 1.6 x 2.1 cm. Small follicular cysts. No dominant cyst or solid mass. Normal color Doppler flow. Pulsed Doppler evaluation of both ovaries demonstrates normal low-resistance arterial and venous waveforms. Other findings No free pelvic fluid. IMPRESSION: 1. Approximate 2 cm cyst arising from the RIGHT ovary. No free fluid to suggest cyst rupture. 2. Subserosal fibroid arising from the LEFT LATERAL uterine body, maximum measurement 3.4 cm, increased in size since 2016. No other visible fibroids. 3. Otherwise normal examination. Electronically Signed   By: Evangeline Dakin M.D.   On: 12/03/2017 16:46   Ct Abdomen Pelvis W  Contrast  Result Date: 12/03/2017 CLINICAL DATA:  Acute abdominal pain EXAM: CT ABDOMEN AND PELVIS WITH CONTRAST TECHNIQUE: Multidetector CT imaging of the abdomen and pelvis was performed using the standard protocol following bolus administration of intravenous contrast. CONTRAST:  135m ISOVUE-300 IOPAMIDOL (ISOVUE-300) INJECTION 61% COMPARISON:  CT abdomen pelvis 04/10/2016 FINDINGS: Lower chest: Negative Hepatobiliary: Postop cholecystectomy. Negative for biliary dilatation. Normal liver. Pancreas: Negative Spleen: Negative Adrenals/Urinary Tract: Small nonobstructing right renal calculi. No left renal calculi. No ureteral calculi. No renal mass or obstruction. Normal bladder. Stomach/Bowel: Negative for bowel obstruction. Negative for bowel mass or edema. Normal appendix. Vascular/Lymphatic: Negative for atherosclerotic disease. No lymphadenopathy Reproductive: Normal uterus and ovaries.  No pelvic mass. Other: Negative for free fluid Musculoskeletal: Negative IMPRESSION: Small nonobstructing right renal calculi. Normal appendix Electronically Signed   By: CFranchot GalloM.D.   On: 12/03/2017 15:35   UK[place]Art/ven Flow Abd Pelv Doppler  Result Date: 12/03/2017 CLINICAL DATA:  37year old G4 P4, LMP 11/26/2017, presenting with 2 week history of LEFT greater than RIGHT pelvic pain. EXAM: TRANSABDOMINAL AND TRANSVAGINAL ULTRASOUND OF PELVIS DOPPLER ULTRASOUND OF OVARIES TECHNIQUE: Both transabdominal and transvaginal ultrasound examinations of the pelvis were performed. Transabdominal technique was performed for global imaging of the pelvis including uterus, ovaries, adnexal regions, and pelvic cul-de-sac. It was necessary to proceed with endovaginal exam following the transabdominal exam to visualize the endometrium and ovaries. Color and duplex Doppler ultrasound was utilized to evaluate blood flow to the ovaries. COMPARISON:  Pelvic ultrasound 12/14/2014, 02/08/2014. CT abdomen and pelvis earlier today,  04/10/2016 and earlier. FINDINGS: Uterus Measurements: Approximately 8.1 x 6.1 x 5.3 cm. Subserosal fibroid arising from the LEFT LATERAL uterine body measuring approximately 3.4 x 2.5 x 2.8 cm, increased in size since the pelvic ultrasound 12/14/2014. No other visible fibroids. Endometrium Thickness: 6 mm. Normal appearance without evidence of endometrial fluid or mass. Right ovary Measurements: Approximately 3.1 x 3.2 x 2.1 cm. Dominant simple cyst measuring approximately 2.3 x 2.3 x 2.1 cm. Smaller follicular cysts. No solid mass. Normal color Doppler flow. Left ovary Measurements: Approximately 3.4 x 1.6 x 2.1 cm. Small follicular cysts. No dominant cyst or solid mass. Normal color Doppler flow. Pulsed Doppler evaluation of both ovaries demonstrates normal low-resistance arterial  and venous waveforms. Other findings No free pelvic fluid. IMPRESSION: 1. Approximate 2 cm cyst arising from the RIGHT ovary. No free fluid to suggest cyst rupture. 2. Subserosal fibroid arising from the LEFT LATERAL uterine body, maximum measurement 3.4 cm, increased in size since 2016. No other visible fibroids. 3. Otherwise normal examination. Electronically Signed   By: Evangeline Dakin M.D.   On: 12/03/2017 16:46    Procedures Procedures (including critical care time)  Medications Ordered in ED Medications  iopamidol (ISOVUE-300) 61 % injection (has no administration in time range)  HYDROmorphone (DILAUDID) injection 0.5 mg (has no administration in time range)  sodium chloride 0.9 % bolus 1,000 mL (0 mLs Intravenous Stopped 12/03/17 1641)  morphine 4 MG/ML injection 4 mg (4 mg Intravenous Given 12/03/17 1432)  ondansetron (ZOFRAN) injection 4 mg (4 mg Intravenous Given 12/03/17 1432)  iopamidol (ISOVUE-300) 61 % injection 100 mL (100 mLs Intravenous Contrast Given 12/03/17 1520)     Initial Impression / Assessment and Plan / ED Course  I have reviewed the triage vital signs and the nursing notes.  Pertinent labs &  imaging results that were available during my care of the patient were reviewed by me and considered in my medical decision making (see chart for details).  Clinical Course as of Dec 04 1707  Thu Dec 04, 2763  6916 37 year old female presents with complaint of abdominal pain with nausea and diarrhea.  On initial exam patient appeared very uncomfortable, she was hypertensive and tachycardic.  Patient had left-sided abdominal pain more so mid to upper abdomen as well as right lower abdominal pain.  CT of the abdomen pelvis with contrast was unremarkable, specifically normal appendix, no evidence of diverticulitis.  This was followed with a pelvic ultrasound which does not show any large ovarian cysts, she does have a small uterine fibroid.  Her urinalysis is unremarkable, pregnancy test is negative, lipase is normal, CBC and chemistry are normal.  Amended patient to follow-up with her PCP, possible referral to GI if her symptoms persist.  Patient states her pain had improved until she had the pelvic ultrasound which was pressing on her abdomen and causing her some pain, she will need an additional dose of pain medication prior to discharge.  Patient verbalizes understanding of discharge instructions and plan.   [LM]    Clinical Course User Index [LM] Tacy Learn, PA-C    Final Clinical Impressions(s) / ED Diagnoses   Final diagnoses:  Generalized abdominal pain  Uterine leiomyoma, unspecified location    ED Discharge Orders        Ordered    ondansetron (ZOFRAN ODT) 4 MG disintegrating tablet  Every 8 hours PRN     12/03/17 1700       Tacy Learn, PA-C 12/03/17 1710    Julianne Rice, MD 12/04/17 [id number]

## 2017-12-03 NOTE — ED Triage Notes (Signed)
Pt sent in from [facility]. Pt has had abdominal pain that has gotten worse since yesterday, in her LLQ, as well as the right side of her groin. Pt states she has had some diarrhea and nausea. [facility] concerned for dehydration and ovarian torsion.

## 2017-12-03 NOTE — Discharge Instructions (Signed)
CT scan of your abdomen and pelvis today is normal.  Ultrasound shows a fibroid within the uterus.  Lab work is reassuring with normal infection counts, you are not anemic, normal liver and kidney function.  No evidence of infection on urinalysis.  Recommend he follow-up with your family doctor for recheck.  Return to ER for worsening or concerning symptoms.

## 2017-12-28 ENCOUNTER — Ambulatory Visit (HOSPITAL_COMMUNITY)
Admission: RE | Admit: 2017-12-28 | Discharge: 2017-12-28 | Disposition: A | Discharge status: Home / Self Care | Payer: BLUE CROSS/BLUE SHIELD | Attending: Psychiatry | Admitting: Psychiatry

## 2017-12-28 DIAGNOSIS — F329 Major depressive disorder, single episode, unspecified: Secondary | ICD-10-CM | POA: Insufficient documentation

## 2017-12-28 DIAGNOSIS — Z87891 Personal history of nicotine dependence: Secondary | ICD-10-CM | POA: Diagnosis not present

## 2017-12-28 DIAGNOSIS — Z818 Family history of other mental and behavioral disorders: Secondary | ICD-10-CM | POA: Diagnosis not present

## 2017-12-28 DIAGNOSIS — F332 Major depressive disorder, recurrent severe without psychotic features: Secondary | ICD-10-CM

## 2017-12-28 DIAGNOSIS — Z880 Allergy status to penicillin: Secondary | ICD-10-CM | POA: Insufficient documentation

## 2017-12-28 NOTE — H&P (Signed)
Behavioral Health Medical Screening Exam  AMEYA Fritz is an 37 y.o. female. Pt presented as a walk-in at [facility] with complaints of worsening depression and stated she has been on Welbutrin for some time and does not feel it is working anymore. Pt stated her PCP is hesitant to increase her dose because she has been having stomach pains for the past year off and on and has been taking oxycodone for the pain for about one month. Pt went to the GI doctor today but they said there is nothing going on with her stomach. She does have an appointment tomorrow with [facility]. Pt stated she is here to get outpatient resources so she can see someone who will be willing to treat her and adjust her medications.  Pt denies suicidal/homicidal ideation, denies auditory/visual hallucinations and does not appear to be responding to internal stimuli. Pt was provided with outpatient resources to follow up in the community. Safety plan in effect: Call 911, got to the nearest emergency room or return to [facility] if symptoms worsen or she feels suicidal or homicidal. Pt verbalized agreement to this plan.   Total Time spent with patient: 30 minutes  Psychiatric Specialty Exam: Physical Exam  Constitutional: She is oriented to person, place, and time. She appears well-developed and well-nourished.  HENT:  Head: Normocephalic.  Eyes: Pupils are equal, round, and reactive to light.  Cardiovascular: Normal rate and normal heart sounds.  Respiratory: Effort normal.  GI: Soft.  Musculoskeletal: Normal range of motion.  Neurological: She is alert and oriented to person, place, and time.  Skin: Skin is warm.  Psychiatric: Her speech is normal and behavior is normal. Judgment and thought content normal. Cognition and memory are normal. She exhibits a depressed mood.    Review of Systems  Psychiatric/Behavioral: Positive for depression. Negative for hallucinations, memory loss, substance abuse and suicidal ideas. The  patient is not nervous/anxious and does not have insomnia.   All other systems reviewed and are negative.   Blood pressure 128/90, pulse 89, temperature 99.2 F (37.3 C), temperature source Oral, resp. rate 16, SpO2 99 %.There is no height or weight on file to calculate BMI.  General Appearance: Casual  Eye Contact:  Good  Speech:  Clear and Coherent and Normal Rate  Volume:  Normal  Mood:  Depressed  Affect:  Congruent  Thought Process:  Coherent, Goal Directed, Linear and Descriptions of Associations: Intact  Orientation:  Full (Time, Place, and Person)  Thought Content:  Logical  Suicidal Thoughts:  No  Homicidal Thoughts:  No  Memory:  Immediate;   Good Recent;   Good Remote;   Fair  Judgement:  Good  Insight:  Fair  Psychomotor Activity:  Normal  Concentration: Concentration: Good and Attention Span: Good  Recall:  Good  Fund of Knowledge:Good  Language: Good  Akathisia:  No  Handed:  Right  AIMS (if indicated):     Assets:  Communication Skills Desire for Improvement Financial Resources/Insurance Housing Transportation Vocational/Educational  Sleep:       Musculoskeletal: Strength & Muscle Tone: within normal limits Gait & Station: normal Patient leans: N/A  Blood pressure 128/90, pulse 89, temperature 99.2 F (37.3 C), temperature source Oral, resp. rate 16, SpO2 99 %.  Recommendations:  Based on my evaluation the patient does not appear to have an emergency medical condition. Pt provided with outpatient resources for psychiatry and therapy.   Ethelene Hal, NP 12/28/2017, 10:15 PM

## 2017-12-28 NOTE — BH Assessment (Addendum)
Assessment Note  Katherine Fritz is an 37 y.o. female. Patient being seen for depression. Patient reported increased depression in the past 2 weeks. Patient denied SI, HI and psychosis. Patient reports history of depression since teenage years. Patient reported being on Wellbutrin 385m daily for past 2 years and is compliant. Patient reported to PCP that medications were not working, however due to stomach pain, PCP would not change dosage amount until cause of pain is known. Patient recently saw a specialist, whom stating no cause of pain which has increased patients frustration. Patient is a wife, mother of 3 children and is employed full-time. Patient stated she is looking for outpatient services, medication management and therapy. Patient has an appointment with [facility] tomorrow at [place] Patient will also request for neuropsych if available. Patient reports stressor of having health issues and not knowing what it is. Patient reported feelings of "can't get it together". Patient reported crying everyday at the smallest things. Patient reported feeling bad because she shared information about a surprise birthday party to the birthday person. Patient reported forgetting a favor asked by her husband and later feeling bad. Patient feels not being able to stay on track. Patient denied any drug or alcohol usage. Patient reported no traumatic history, no courtdates or probation and no history of inpatient treatment. Pt verbalized agreement to this plan, to call 911, got to the nearest emergency room [facility] or [facility] feels [facility] or hom[facility].    Patient demonstrated good eye contact. Speech was logical/coherent. Patient was alert and tearful throughout assessment. Patient was depressed, sad and anxious. Patient was coherent and oriented x4.  Disposition: Initial Assessment Completed for this Encounter: Yes Disposition of Patient: Discharge Patient refused recommended  treatment: No Mode of transportation if patient is discharged?: Car Patient referred to: Outpatient clinic referral, Other (Comment)(Multiple outpatient service providers). Patient given referrals for medication management and outpatient therapy providers. Patient will also follow-up with PCP. [facility].   Diagnosis: Major Depressive Disorder  Past Medical History:  Past Medical History:  Diagnosis Date  . Abnormal Pap smear    Colposcopy normal  . Anemia   . Depression    Not on current treatment  . Headache   . History of kidney stones   . Kidney stones 2009  . MRSA infection   . Ovarian cyst   . Vaginal Pap smear, abnormal     Past Surgical History:  Procedure Laterality Date  . CHOLECYSTECTOMY    . COLPOSCOPY    . DENTAL SURGERY    . LAPAROSCOPIC BILATERAL SALPINGECTOMY Bilateral 03/19/2016   Procedure: LAPAROSCOPIC BILATERAL SALPINGECTOMY;  Surgeon: EThurnell Lose MD;  [facility];  Service: Gynecology;  Laterality: Bilateral;    Family History:  Family History  Problem Relation Age of Onset  . COPD Mother   . Hypertension Mother   . Heart disease Mother   . Lung cancer Mother   . Hyperlipidemia Sister   . Hypertension Sister   . Depression Brother   . Drug abuse Brother   . Other Father        unsure of birth father's history  . Multiple myeloma Maternal Aunt   . Cancer Maternal Uncle     Social History:  reports that she quit smoking about 9 years ago. Her smoking use included cigarettes. She smoked 1.00 pack per day. She has never used smokeless tobacco. She reports that she  drinks alcohol. She reports that she does not use drugs.  Additional Social History:     CIWA:   COWS:    Allergies:  Allergies  Allergen Reactions  . Penicillins Rash and Other (See Comments)    Has patient had a PCN reaction causing immediate rash, facial/tongue/throat swelling, SOB or lightheadedness with  hypotension: No Has patient had a PCN reaction causing severe rash involving mucus membranes or skin necrosis: No Has patient had a PCN reaction that required hospitalization No Has patient had a PCN reaction occurring within the last 10 years: No If all of the above answers are "NO", then may proceed with Cephalosporin use.    Home Medications:  (Not in a hospital admission)  OB/GYN Status:  No LMP recorded.  General Assessment Data TTS Assessment: In system Is this a Tele or Face-to-Face Assessment?: Face-to-Face Is this an Initial Assessment or a Re-assessment for this encounter?: Initial Assessment Marital status: Married Is patient pregnant?: Unknown Pregnancy Status: Unknown Living Arrangements: Spouse/significant other, Children Can pt return to current living arrangement?: Yes Admission Status: Voluntary Is patient capable of signing voluntary admission?: Yes Referral Source: Self/Family/Friend  Medical Screening Exam ([place]) Medical Exam completed: Yes  Crisis Care Plan Living Arrangements: Spouse/significant other, Children Name of Psychiatrist: none Name of Therapist: none  Education Status Is patient currently in school?: No Is the patient employed, unemployed or receiving disability?: Employed  Risk to self with the past 6 months Suicidal Ideation: No Has patient been a risk to self within the past 6 months prior to admission? : No Suicidal Intent: No Has patient had any suicidal intent within the past 6 months prior to admission? : No Is patient at risk for suicide?: No Suicidal Plan?: No Has patient had any suicidal plan within the past 6 months prior to admission? : No Access to Means: No What has been your use of drugs/alcohol within the last 12 months?: n/a Previous Attempts/Gestures: No How many times?: (n/a) Other Self Harm Risks: (none) Triggers for Past Attempts: (n/a) Intentional Self Injurious Behavior: None Family Suicide  History: No Recent stressful life event(s): (medication not working) Persecutory voices/beliefs?: No Depression: Yes Depression Symptoms: Tearfulness, Feeling worthless/self pity, Insomnia(4-5 hours sleep) Substance abuse history and/or treatment for substance abuse?: No  Risk to Others within the past 6 months Homicidal Ideation: No Does patient have any lifetime risk of violence toward others beyond the six months prior to admission? : No Thoughts of Harm to Others: No Current Homicidal Intent: No Current Homicidal Plan: No Access to Homicidal Means: (n/a) Identified Victim: n/a History of harm to others?: No Assessment of Violence: None Noted Violent Behavior Description: (n/a) Does patient have access to weapons?: No Criminal Charges Pending?: No Does patient have a court date: No Is patient on probation?: No  Psychosis Hallucinations: None noted Delusions: None noted  Mental Status Report Appearance/Hygiene: Unremarkable Eye Contact: Good Motor Activity: Unremarkable Speech: Logical/coherent Level of Consciousness: Alert, Crying Mood: Depressed, Sad, Anxious Affect: Anxious, Depressed, Sad Anxiety Level: Moderate Thought Processes: Coherent Judgement: Unimpaired Orientation: Person, Place, Time, Situation Obsessive Compulsive Thoughts/Behaviors: None  Cognitive Functioning Concentration: Normal Memory: Recent Intact, Remote Intact Is patient IDD: No Is patient DD?: No Impulse Control: Fair Appetite: Good Have you had any weight changes? : No Change Sleep: Decreased Total Hours of Sleep: 4 Vegetative Symptoms: None  ADLScreening [facility]) Patient's cognitive ability adequate to safely complete daily activities?: Yes Patient able to express need for assistance with ADLs?:  Yes Independently performs ADLs?: Yes (appropriate for developmental age)  Prior Inpatient Therapy Prior Inpatient Therapy: No  Prior Outpatient Therapy Prior  Outpatient Therapy: No Does patient have an ACCT team?: No Does patient have Intensive In-House Services?  : No Does patient have [facility] services? : No Does patient have P4CC services?: No  ADL Screening (condition at time of admission) Patient's cognitive ability adequate to safely complete daily activities?: Yes Patient able to express need for assistance with ADLs?: Yes Independently performs ADLs?: Yes (appropriate for developmental age)                  Additional Information 1:1 In Past 12 Months?: No CIRT Risk: No Elopement Risk: No     Disposition:  Disposition Initial Assessment Completed for this Encounter: Yes Disposition of Patient: Discharge Patient refused recommended treatment: No Mode of transportation if patient is discharged?: Car Patient referred to: Outpatient clinic referral, Other (Comment)(Multiple outpatient service providers)  On Site Evaluation by:  Kirtland Bouchard, [place] with Physician: Jinny Blossom, NP  Venora Maples 12/28/2017 10:05 PM

## 2017-12-29 ENCOUNTER — Ambulatory Visit: Payer: BLUE CROSS/BLUE SHIELD | Admitting: Adult Health

## 2017-12-29 ENCOUNTER — Encounter: Payer: Self-pay | Admitting: Adult Health

## 2017-12-29 VITALS — BP 126/96 | HR 78 | Ht 64.0 in | Wt 232.8 lb

## 2017-12-29 DIAGNOSIS — IMO0002 Reserved for concepts with insufficient information to code with codable children: Secondary | ICD-10-CM

## 2017-12-29 DIAGNOSIS — G43709 Chronic migraine without aura, not intractable, without status migrainosus: Secondary | ICD-10-CM | POA: Diagnosis not present

## 2017-12-29 NOTE — Progress Notes (Signed)
I have reviewed and agreed above plan. 

## 2017-12-29 NOTE — Patient Instructions (Addendum)
Your Plan:  Restart nortriptyline take 25 mg at bedtime for 1 week then increase to 50 mg at bedtime Stop Excedrin migraine Imitrex: take 1 tablet at the onset of migraine- can repeat in 2 hours if needed. Not to exceed 2 tablets in 24 hours. Should not take it consistently for more than 2 days.  If your symptoms worsen or you develop new symptoms please let us know.    Thank you for coming to see [place] at [facility]. I hope we have been able to provide you high quality care today.  You may receive a patient satisfaction survey over the next few weeks. We would appreciate your feedback and comments so that we may continue to improve ourselves and the health of our patients.

## 2017-12-29 NOTE — Progress Notes (Signed)
PATIENT: Katherine Fritz DOB: May 01, 1981  REASON FOR VISIT: follow up HISTORY FROM: patient  HISTORY OF PRESENT ILLNESS:  HISTORY Katherine Fritz is a 37 year old female, seen in refer by her primary care physician Dr. Maurice Small, for evaluation of migraine headaches, initial evaluation was on October 28, 2017.  She had a history of Wellbutrin, recurrent kidney stone, denies a previous history of headaches,  Since March 2019, without clear triggers, she began to have frequent headaches, often started at the right cervical region, spreading to right temporal frontal region, with severe pounding headache associated light noise smells sensitivity, lasting hours to days,  She has tried over-the-counter Excedrin Migraine, provide limited help, was given prescription of Fioricet, but because of frequent headaches, nearly every day, also tried tramadol 50 mg 2 tablets which provide some help, Imitrex subcutaneous injection at her primary care's office was helpful, now she is taking Imitrex 25 mg as needed, which provided partial relief,  But over the past 3 months, she has been taking daily medications for her daily headaches,  She presented to the emergency room on April 09/12/2017 for headache, nausea, CT head without contrast was normal, EKG was normal, Laboratory evaluation showed low potassium 3.2, mild elevated WBC  Today 12/29/17  Katherine Fritz is a 37 year old female with a history of migraine headaches.  She returns today for follow-up.  She states that initially she did have some benefit with the nortriptyline.  However she states that she is been having other health problems therefore she stopped taking the medication.  She states that she is having headaches at least every other day.  Sometimes they can last for several days.  She reports that she is taking sumatriptan every other day and she takes Excedrin daily.  She returns today for evaluation.   REVIEW OF SYSTEMS: Out  of a complete 14 system review of symptoms, the patient complains only of the following symptoms, and all other reviewed systems are negative.  See HPI  ALLERGIES: Allergies  Allergen Reactions  . Penicillins Rash and Other (See Comments)    Has patient had a PCN reaction causing immediate rash, facial/tongue/throat swelling, SOB or lightheadedness with hypotension: No Has patient had a PCN reaction causing severe rash involving mucus membranes or skin necrosis: No Has patient had a PCN reaction that required hospitalization No Has patient had a PCN reaction occurring within the last 10 years: No If all of the above answers are "NO", then may proceed with Cephalosporin use.    HOME MEDICATIONS: Outpatient Medications Prior to Visit  Medication Sig Dispense Refill  . aspirin-acetaminophen-caffeine (EXCEDRIN MIGRAINE) 250-250-65 MG tablet Take 1 tablet by mouth every 6 (six) hours as needed for headache or migraine.    Marland Kitchen buPROPion (WELLBUTRIN XL) 300 MG 24 hr tablet Take 300 mg by mouth daily.    . cyclobenzaprine (FLEXERIL) 10 MG tablet Take 10 mg by mouth 3 (three) times daily as needed for muscle spasms.    . Multiple Vitamins-Minerals (MULTIVITAMIN ADULT PO) Take 1 tablet by mouth daily.    . mupirocin ointment (BACTROBAN) 2 % Apply 1 application topically 2 (two) times daily as needed (eczema).    . nortriptyline (PAMELOR) 25 MG capsule TAKE 2 CAPSULES (50 MG TOTAL) BY MOUTH AT BEDTIME. 180 capsule 3  . ondansetron (ZOFRAN ODT) 4 MG disintegrating tablet Take 1 tablet (4 mg total) by mouth every 8 (eight) hours as needed for nausea or vomiting. 12 tablet 0  . ondansetron (ZOFRAN)  8 MG tablet Take 8 mg by mouth every 8 (eight) hours as needed for nausea or vomiting.    Marland Kitchen oxyCODONE (OXY IR/ROXICODONE) 5 MG immediate release tablet Take 5-10 mg by mouth every 6 (six) hours as needed for pain.  0  . penciclovir (DENAVIR) 1 % cream Apply 1 application topically daily as needed (cold sores).     . SUMAtriptan (IMITREX) 100 MG tablet Take 1 tablet (100 mg total) by mouth every 2 (two) hours as needed for migraine. May repeat in 2 hours if headache persists or recurs. 12 tablet 11  . tamsulosin (FLOMAX) 0.4 MG CAPS capsule Take 0.4 mg by mouth as needed (kidney stone).     . traMADol (ULTRAM) 50 MG tablet Take 50 mg by mouth 3 (three) times daily as needed for moderate pain.   0  . triamcinolone (KENALOG) 0.025 % ointment Apply 1 application topically as needed (eczema).     . valACYclovir (VALTREX) 1000 MG tablet Take 1,000 mg by mouth as needed (cold sores).      No facility-administered medications prior to visit.     PAST MEDICAL HISTORY: Past Medical History:  Diagnosis Date  . Abnormal Pap smear    Colposcopy normal  . Anemia   . Depression    Not on current treatment  . Headache   . History of kidney stones   . Kidney stones 2009  . MRSA infection   . Ovarian cyst   . Vaginal Pap smear, abnormal     PAST SURGICAL HISTORY: Past Surgical History:  Procedure Laterality Date  . CHOLECYSTECTOMY    . COLPOSCOPY    . DENTAL SURGERY    . LAPAROSCOPIC BILATERAL SALPINGECTOMY Bilateral 03/19/2016   Procedure: LAPAROSCOPIC BILATERAL SALPINGECTOMY;  Surgeon: Thurnell Lose, MD;  Location: [facility];  Service: Gynecology;  Laterality: Bilateral;    FAMILY HISTORY: Family History  Problem Relation Age of Onset  . COPD Mother   . Hypertension Mother   . Heart disease Mother   . Lung cancer Mother   . Hyperlipidemia Sister   . Hypertension Sister   . Depression Brother   . Drug abuse Brother   . Other Father        unsure of birth father's history  . Multiple myeloma Maternal Aunt   . Cancer Maternal Uncle     SOCIAL HISTORY: Social History   Socioeconomic History  . Marital status: Married    Spouse name: Not on file  . Number of children: 4  . Years of education: 43  . Highest education level: Associate degree: occupational, Hotel manager, or vocational program   Occupational History  . 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 Financial resource strain: Not on file  . Food insecurity:    Worry: Not on file    Inability: Not on file  . Transportation needs:    Medical: Not on file    Non-medical: Not on file  Tobacco Use  . Smoking status: Former Smoker    Packs/day: 1.00    Types: Cigarettes    Last attempt to quit: 11/2008    Years since quitting: 9.1  . Smokeless tobacco: Never Used  Substance and Sexual Activity  . Alcohol use: Yes    Comment: occasionally (1-2 drinks per month)  . Drug use: No  . Sexual activity: Not Currently    Birth control/protection: Surgical  Lifestyle  . Physical activity:    Days per week: Not on file  Minutes per session: Not on file  . Stress: Not on file  Relationships  . Social connections:    Talks on phone: Not on file    Gets together: Not on file    Attends religious service: Not on file    Active member of club or organization: Not on file    Attends meetings of clubs or organizations: Not on file    Relationship status: Not on file  . Intimate partner violence:    Fear of current or ex partner: Not on file    Emotionally abused: Not on file    Physically abused: Not on file    Forced sexual activity: Not on file  Other Topics Concern  . Not on file  Social History Narrative   Lives at home with husband and children.   Right-handed.   1-2 cups caffeine per day.      PHYSICAL EXAM  Vitals:   12/29/17 1013  Height: '5\' 4"'  (1.626 m)   Body mass index is 39.48 kg/m.  Generalized: Well developed, in no acute distress   Neurological examination  Mentation: Alert oriented to time, place, history taking. Follows all commands speech and language fluent Cranial nerve II-XII: Pupils were equal round reactive to light. Extraocular movements were full, visual field were full on confrontational test. Facial sensation and strength were normal. Uvula tongue midline.  Head turning and shoulder shrug  were normal and symmetric. Motor: The motor testing reveals 5 over 5 strength of all 4 extremities. Good symmetric motor tone is noted throughout.  Sensory: Sensory testing is intact to soft touch on all 4 extremities. No evidence of extinction is noted.  Coordination: Cerebellar testing reveals good finger-nose-finger and heel-to-shin bilaterally.  Gait and station: Gait is normal. Tandem gait is normal. Romberg is negative. No drift is seen.  Reflexes: Deep tendon reflexes are symmetric and normal bilaterally.   DIAGNOSTIC DATA (LABS, IMAGING, TESTING) - I reviewed patient records, labs, notes, testing and imaging myself where available.  Lab Results  Component Value Date   WBC 8.3 12/03/2017   HGB 13.5 12/03/2017   HCT 41.0 12/03/2017   MCV 88.9 12/03/2017   PLT 368 12/03/2017      Component Value Date/Time   NA 140 12/03/2017 1437   K 3.7 12/03/2017 1437   CL 103 12/03/2017 1437   CO2 29 12/03/2017 1437   GLUCOSE 89 12/03/2017 1437   BUN 9 12/03/2017 1437   CREATININE 0.79 12/03/2017 1437   CREATININE 0.68 07/01/2010 1101   CALCIUM 9.3 12/03/2017 1437   PROT 7.4 12/03/2017 1437   ALBUMIN 4.0 12/03/2017 1437   AST 23 12/03/2017 1437   ALT 16 12/03/2017 1437   ALKPHOS 72 12/03/2017 1437   BILITOT 0.3 12/03/2017 1437   GFRNONAA >60 12/03/2017 1437   GFRAA >60 12/03/2017 1437   Lab Results  Component Value Date   CHOL 166 07/01/2010   HDL 61 07/01/2010   LDLCALC 88 07/01/2010   TRIG 83 07/01/2010   CHOLHDL 2.7 Ratio 07/01/2010   No results found for: HGBA1C No results found for: VITAMINB12 Lab Results  Component Value Date   TSH 1.714 09/03/2009      ASSESSMENT AND PLAN 37 y.o. year old female  has a past medical history of Abnormal Pap smear, Anemia, Depression, Headache, History of kidney stones, Kidney stones (2009), MRSA infection, Ovarian cyst, and Vaginal Pap smear, abnormal. here with :  1.  Migraine headaches  The  patient will restart nortriptyline.  She will begin by taking 25 mg at bedtime for 1 week and increasing to 50 mg thereafter.  I did advise that it can take up to 4 weeks to begin noticing any change in her headache frequency.  She is advised to stop taking Excedrin Migraine as this could be causing rebound headaches.  She is also advised that she should take sumatriptan at the onset of her migraine.  If it does not resolve she can repeat the dose in 2 hours.  She is not to exceed 2 tablets in 24 hours.  She should not take Imitrex consistently for more than 2 days as it is not meant to be taken daily.  Patient verbalized understanding.  She will follow-up in 6 months or sooner if needed.   I spent 15 minutes with the patient. 50% of this time was spent discussing her medication regimen.   Ward Givens, MSN, NP-C 12/29/2017, 10:18 AM [facility] [address] [place], [place] [address] [phone]

## 2018-01-27 ENCOUNTER — Other Ambulatory Visit: Payer: Self-pay | Admitting: Family Medicine

## 2018-01-27 DIAGNOSIS — M5416 Radiculopathy, lumbar region: Secondary | ICD-10-CM

## 2018-01-31 ENCOUNTER — Ambulatory Visit
Admission: RE | Admit: 2018-01-31 | Discharge: 2018-01-31 | Disposition: A | Discharge status: Home / Self Care | Payer: BLUE CROSS/BLUE SHIELD | Source: Ambulatory Visit | Attending: Family Medicine | Admitting: Family Medicine

## 2018-01-31 DIAGNOSIS — M5416 Radiculopathy, lumbar region: Secondary | ICD-10-CM

## 2018-03-08 ENCOUNTER — Other Ambulatory Visit: Payer: Self-pay

## 2018-03-08 ENCOUNTER — Encounter (HOSPITAL_COMMUNITY): Payer: Self-pay

## 2018-03-08 ENCOUNTER — Emergency Department (HOSPITAL_COMMUNITY)
Admission: EM | Admit: 2018-03-08 | Discharge: 2018-03-09 | Disposition: A | Discharge status: Home / Self Care | Payer: BLUE CROSS/BLUE SHIELD | Attending: Emergency Medicine | Admitting: Emergency Medicine

## 2018-03-08 DIAGNOSIS — Z79899 Other long term (current) drug therapy: Secondary | ICD-10-CM | POA: Insufficient documentation

## 2018-03-08 DIAGNOSIS — Z87891 Personal history of nicotine dependence: Secondary | ICD-10-CM | POA: Diagnosis not present

## 2018-03-08 DIAGNOSIS — R1031 Right lower quadrant pain: Secondary | ICD-10-CM | POA: Insufficient documentation

## 2018-03-08 DIAGNOSIS — R1030 Lower abdominal pain, unspecified: Secondary | ICD-10-CM

## 2018-03-08 LAB — URINALYSIS, ROUTINE W REFLEX MICROSCOPIC
BILIRUBIN URINE: NEGATIVE
Glucose, UA: NEGATIVE mg/dL
KETONES UR: NEGATIVE mg/dL
LEUKOCYTES UA: NEGATIVE
NITRITE: NEGATIVE
Protein, ur: NEGATIVE mg/dL
Specific Gravity, Urine: 1.017 (ref 1.005–1.030)
pH: 6 (ref 5.0–8.0)

## 2018-03-08 LAB — CBC
HEMATOCRIT: 40 % (ref 36.0–46.0)
HEMOGLOBIN: 12.9 g/dL (ref 12.0–15.0)
MCH: 28.9 pg (ref 26.0–34.0)
MCHC: 32.3 g/dL (ref 30.0–36.0)
MCV: 89.5 fL (ref 80.0–100.0)
Platelets: 345 10*3/uL (ref 150–400)
RBC: 4.47 MIL/uL (ref 3.87–5.11)
RDW: 13.1 % (ref 11.5–15.5)
WBC: 9.5 10*3/uL (ref 4.0–10.5)
nRBC: 0 % (ref 0.0–0.2)

## 2018-03-08 LAB — COMPREHENSIVE METABOLIC PANEL
ALT: 17 U/L (ref 0–44)
ANION GAP: 9 (ref 5–15)
AST: 20 U/L (ref 15–41)
Albumin: 3.9 g/dL (ref 3.5–5.0)
Alkaline Phosphatase: 67 U/L (ref 38–126)
BUN: 9 mg/dL (ref 6–20)
CHLORIDE: 105 mmol/L (ref 98–111)
CO2: 26 mmol/L (ref 22–32)
Calcium: 9.1 mg/dL (ref 8.9–10.3)
Creatinine, Ser: 0.81 mg/dL (ref 0.44–1.00)
GFR calc Af Amer: >60 mL/min (ref >60)
Glucose, Bld: 88 mg/dL (ref 70–99)
POTASSIUM: 3.9 mmol/L (ref 3.5–5.1)
SODIUM: 140 mmol/L (ref 135–145)
Total Bilirubin: 0.7 mg/dL (ref 0.3–1.2)
Total Protein: 7.3 g/dL (ref 6.5–8.1)

## 2018-03-08 LAB — PREGNANCY, URINE: Preg Test, Ur: NEGATIVE

## 2018-03-08 LAB — LIPASE, BLOOD: LIPASE: 36 U/L (ref 11–51)

## 2018-03-08 MED ORDER — KETOROLAC TROMETHAMINE 30 MG/ML IJ SOLN
30.0000 mg | Freq: Once | INTRAMUSCULAR | Status: AC
  Administered 2018-03-08: 30 mg via INTRAMUSCULAR
  Filled 2018-03-08: qty 1

## 2018-03-08 MED ORDER — HYDROCODONE-ACETAMINOPHEN 5-325 MG PO TABS
1.0000 | ORAL_TABLET | Freq: Once | ORAL | Status: AC
  Administered 2018-03-08: 1 via ORAL
  Filled 2018-03-08: qty 1

## 2018-03-08 NOTE — ED Triage Notes (Signed)
Patient c/o bilateral lower abdominal pain since yesterday and nausea only in the mornings x 1 week.

## 2018-03-08 NOTE — ED Provider Notes (Signed)
[place] DEPT Provider Note   CSN: [id number] Arrival date & time: 03/08/18  1833     History   Chief Complaint Chief Complaint  Patient presents with  . Abdominal Pain  . Nausea    HPI Katherine Fritz is a 37 y.o. female.  The history is provided by the patient.  Abdominal Pain   This is a chronic problem. The current episode started more than 1 week ago. The problem occurs daily. The problem has been gradually worsening. The pain is located in the RLQ and LLQ. The pain is severe. Associated symptoms include nausea. Pertinent negatives include fever, diarrhea, hematochezia, vomiting and dysuria. The symptoms are aggravated by certain positions and palpation. Nothing relieves the symptoms.  She with history of chronic abdominal pain presents for abdominal pain She reports she is had daily right lower quadrant abdominal pain since July.  Today the pain appears to be worsening and moving to the left lower quadrant.  Denies fever/vomiting.  No vaginal bleeding or discharge. She is scheduled to see a pain clinic 2 days. Reports her home oxycodone is not improving her pain  Past Medical History:  Diagnosis Date  . Abnormal Pap smear    Colposcopy normal  . Anemia   . Depression    Not on current treatment  . Headache   . History of kidney stones   . Kidney stones 2009  . MRSA infection   . Ovarian cyst   . Vaginal Pap smear, abnormal     Patient Active Problem List   Diagnosis Date Noted  . MDD (major depressive disorder), recurrent episode, severe ([name]) 12/28/2017  . Chronic migraine 10/28/2017  . Biliary colic 25/36/6440  . General counseling and advice on female contraception 03/14/2013  . Tinea pedis 11/08/2010  . MOOD SWINGS 09/03/2009  . ABNORMAL PAP SMEAR, LGSIL 08/09/2009  . OBESITY 07/11/2009  . RH FACTOR, NEGATIVE 05/12/2008  . ANEMIA, IRON DEFICIENCY, HX OF 04/07/2007  . DEPRESSIVE DISORDER, NOS 07/23/2006    Past  Surgical History:  Procedure Laterality Date  . CHOLECYSTECTOMY    . COLPOSCOPY    . DENTAL SURGERY    . LAPAROSCOPIC BILATERAL SALPINGECTOMY Bilateral 03/19/2016   Procedure: LAPAROSCOPIC BILATERAL SALPINGECTOMY;  Surgeon: Thurnell Lose, MD;  Location: [facility];  Service: Gynecology;  Laterality: Bilateral;     OB History    Gravida  4   Para  4   Term  4   Preterm      AB      Living  4     SAB      TAB      Ectopic      Multiple      Live Births  4            Home Medications    Prior to Admission medications   Medication Sig Start Date End Date Taking? Authorizing Provider  aspirin-acetaminophen-caffeine (EXCEDRIN MIGRAINE) [phone] MG tablet Take 1 tablet by mouth every 6 (six) hours as needed for headache or migraine.    [provider]  buPROPion (WELLBUTRIN XL) 300 MG 24 hr tablet Take 300 mg by mouth daily.    [provider]  cyclobenzaprine (FLEXERIL) 10 MG tablet Take 10 mg by mouth 3 (three) times daily as needed for muscle spasms.    [provider]  Multiple Vitamins-Minerals (MULTIVITAMIN ADULT PO) Take 1 tablet by mouth daily.    [provider]  mupirocin ointment (BACTROBAN) 2 %  Apply 1 application topically 2 (two) times daily as needed (eczema).    [provider]  nortriptyline (PAMELOR) 25 MG capsule TAKE 2 CAPSULES (50 MG TOTAL) BY MOUTH AT BEDTIME. Patient not taking: Reported on 12/29/2017 11/19/17   Marcial Pacas, MD  ondansetron (ZOFRAN) 8 MG tablet Take 8 mg by mouth every 8 (eight) hours as needed for nausea or vomiting.    [provider]  oxyCODONE (OXY IR/ROXICODONE) 5 MG immediate release tablet Take 5-10 mg by mouth every 6 (six) hours as needed for pain. 12/01/17   [provider]  penciclovir (DENAVIR) 1 % cream Apply 1 application topically daily as needed (cold sores).    [provider]  SUMAtriptan (IMITREX) 100 MG tablet Take 1 tablet (100 mg total) by mouth  every 2 (two) hours as needed for migraine. May repeat in 2 hours if headache persists or recurs. 10/28/17   Marcial Pacas, MD  tamsulosin (FLOMAX) 0.4 MG CAPS capsule Take 0.4 mg by mouth as needed (kidney stone).     [provider]  triamcinolone (KENALOG) 0.025 % ointment Apply 1 application topically as needed (eczema).     [provider]  valACYclovir (VALTREX) 1000 MG tablet Take 1,000 mg by mouth as needed (cold sores).     [provider]    Family History Family History  Problem Relation Age of Onset  . COPD Mother   . Hypertension Mother   . Heart disease Mother   . Lung cancer Mother   . Hyperlipidemia Sister   . Hypertension Sister   . Depression Brother   . Drug abuse Brother   . Other Father        unsure of birth father's history  . Multiple myeloma Maternal Aunt   . Cancer Maternal Uncle     Social History Social History   Tobacco Use  . Smoking status: Former Smoker    Packs/day: 1.00    Types: Cigarettes    Last attempt to quit: 11/2008    Years since quitting: 9.2  . Smokeless tobacco: Never Used  Substance Use Topics  . Alcohol use: Yes    Comment: occasionally (1-2 drinks per month)  . Drug use: No     Allergies   Penicillins   Review of Systems Review of Systems  Constitutional: Negative for fever.  Respiratory: Negative for cough.   Gastrointestinal: Positive for abdominal pain and nausea. Negative for diarrhea, hematochezia and vomiting.  Genitourinary: Negative for dysuria, vaginal bleeding and vaginal discharge.  Psychiatric/Behavioral: The patient is nervous/anxious.   All other systems reviewed and are negative.    Physical Exam Updated Vital Signs BP 136/84 (BP Location: Left Arm)   Pulse 82   Temp 98.2 F (36.8 C) (Oral)   Resp 15   Ht 1.626 m ('5\' 4"' )   Wt 104.3 kg   LMP 02/13/2018   SpO2 98%   BMI 39.48 kg/m   Physical Exam CONSTITUTIONAL: Well developed/well nourished, anxious HEAD:  Normocephalic/atraumatic EYES: EOMI/PERRL, no icterus ENMT: Mucous membranes moist NECK: supple no meningeal signs SPINE/BACK:entire spine nontender CV: S1/S2 noted, no murmurs/rubs/gallops noted LUNGS: Lungs are clear to auscultation bilaterally, no apparent distress ABDOMEN: soft, nontender, no rebound or guarding, bowel sounds noted throughout abdomen GU:no cva tenderness NEURO: Pt is awake/alert/appropriate, moves all extremitiesx4.  No facial droop.   EXTREMITIES: pulses normal/equal, full ROM SKIN: warm, color normal PSYCH: Anxious   ED Treatments / Results  Labs (all labs ordered are listed, but only  abnormal results are displayed) Labs Reviewed  URINALYSIS, ROUTINE W REFLEX MICROSCOPIC - Abnormal; Notable for the following components:      Result Value   APPearance HAZY (*)    Hgb urine dipstick MODERATE (*)    RBC / HPF >50 (*)    Bacteria, UA RARE (*)    All other components within normal limits  LIPASE, BLOOD  COMPREHENSIVE METABOLIC PANEL  CBC  PREGNANCY, URINE    EKG None  Radiology No results found.  Procedures Procedures   Medications Ordered in ED Medications  ketorolac (TORADOL) 30 MG/ML injection 30 mg (30 mg Intramuscular Given 03/08/18 2340)  HYDROcodone-acetaminophen (NORCO/VICODIN) 5-325 MG per tablet 1 tablet (1 tablet Oral Given 03/08/18 2338)     Initial Impression / Assessment and Plan / ED Course  I have reviewed the triage vital signs and the nursing notes.  Pertinent labs results that were available during my care of the patient were reviewed by me and considered in my medical decision making (see chart for details).     11:59 PM Patient with history of chronic daily abdominal pain presents with worsening chronic pain.  Her vital signs are appropriate.  Labs reviewed and unremarkable. Her abdominal exam is unremarkable, no signs of acute surgical abdomen.  She can ambulate without difficulty She has had extensive work-up  previously, has been seen by gynecology for this pain before, and is scheduled to see pain management this week. Narcotic database reviewed and considered in decision making Patient receives oxycodone as an outpatient, she can continue this. 12:49 AM She appears mildly improved, will discharge home.  Discussed strict return precautions Final Clinical Impressions(s) / ED Diagnoses   Final diagnoses:  Lower abdominal pain    ED Discharge Orders    None       Ripley Fraise, MD 03/09/18 [phone]

## 2018-05-15 ENCOUNTER — Other Ambulatory Visit: Payer: Self-pay

## 2018-05-15 ENCOUNTER — Encounter (HOSPITAL_COMMUNITY): Payer: Self-pay

## 2018-05-15 ENCOUNTER — Emergency Department (HOSPITAL_COMMUNITY): Payer: BLUE CROSS/BLUE SHIELD

## 2018-05-15 ENCOUNTER — Emergency Department (HOSPITAL_COMMUNITY)
Admission: EM | Admit: 2018-05-15 | Discharge: 2018-05-15 | Disposition: A | Discharge status: Home / Self Care | Payer: BLUE CROSS/BLUE SHIELD | Attending: Emergency Medicine | Admitting: Emergency Medicine

## 2018-05-15 DIAGNOSIS — Y999 Unspecified external cause status: Secondary | ICD-10-CM | POA: Insufficient documentation

## 2018-05-15 DIAGNOSIS — Y929 Unspecified place or not applicable: Secondary | ICD-10-CM | POA: Diagnosis not present

## 2018-05-15 DIAGNOSIS — X509XXA Other and unspecified overexertion or strenuous movements or postures, initial encounter: Secondary | ICD-10-CM | POA: Diagnosis not present

## 2018-05-15 DIAGNOSIS — Y939 Activity, unspecified: Secondary | ICD-10-CM | POA: Diagnosis not present

## 2018-05-15 DIAGNOSIS — Z87891 Personal history of nicotine dependence: Secondary | ICD-10-CM | POA: Insufficient documentation

## 2018-05-15 DIAGNOSIS — S93491A Sprain of other ligament of right ankle, initial encounter: Secondary | ICD-10-CM | POA: Diagnosis not present

## 2018-05-15 DIAGNOSIS — Z79899 Other long term (current) drug therapy: Secondary | ICD-10-CM | POA: Diagnosis not present

## 2018-05-15 DIAGNOSIS — S99911A Unspecified injury of right ankle, initial encounter: Secondary | ICD-10-CM | POA: Diagnosis present

## 2018-05-15 MED ORDER — IBUPROFEN 800 MG PO TABS: 800.0000 mg | ORAL_TABLET | Freq: Three times a day (TID) | ORAL | 0 refills | Status: DC | PRN

## 2018-05-15 MED ORDER — IBUPROFEN 800 MG PO TABS
800.0000 mg | ORAL_TABLET | Freq: Once | ORAL | Status: AC
  Administered 2018-05-15: 800 mg via ORAL
  Filled 2018-05-15: qty 1

## 2018-05-15 NOTE — ED Provider Notes (Signed)
[facility] DEPT Provider Note   CSN: [id number] Arrival date & time: 05/15/18  2059     History   Chief Complaint Chief Complaint  Patient presents with  . Ankle Pain    HPI Katherine Fritz is a 37 y.o. female.   Ankle Pain   Pertinent negatives include no numbness.   Katherine Fritz is a 37 y.o. female who presents the emergency department complaining of right ankle pain after twisting her ankle oddly this afternoon.  She did fall, but did not hit head or lose consciousness.  Denies any other injuries other than right ankle pain.  She felt a pop.  Has been ambulatory since, although with a limp and this does exacerbate her pain.  She has sprained her ankle several times in the past, but never broken any bones of her right lower extremity or had any orthopedic surgeries.  No numbness or weakness.  Took Aleve at about 3PM today.  Past Medical History:  Diagnosis Date  . Abnormal Pap smear    Colposcopy normal  . Anemia   . Depression    Not on current treatment  . Headache   . History of kidney stones   . Kidney stones 2009  . MRSA infection   . Ovarian cyst   . Vaginal Pap smear, abnormal     Patient Active Problem List   Diagnosis Date Noted  . MDD (major depressive disorder), recurrent episode, severe ([place]) 12/28/2017  . Chronic migraine 10/28/2017  . Biliary colic 41/93/7902  . General counseling and advice on female contraception 03/14/2013  . Tinea pedis 11/08/2010  . MOOD SWINGS 09/03/2009  . ABNORMAL PAP SMEAR, LGSIL 08/09/2009  . OBESITY 07/11/2009  . RH FACTOR, NEGATIVE 05/12/2008  . ANEMIA, IRON DEFICIENCY, HX OF 04/07/2007  . DEPRESSIVE DISORDER, NOS 07/23/2006    Past Surgical History:  Procedure Laterality Date  . CHOLECYSTECTOMY    . COLPOSCOPY    . DENTAL SURGERY    . LAPAROSCOPIC BILATERAL SALPINGECTOMY Bilateral 03/19/2016   Procedure: LAPAROSCOPIC BILATERAL SALPINGECTOMY;  Surgeon: Thurnell Lose, MD;   Location: [facility];  Service: Gynecology;  Laterality: Bilateral;     OB History    Gravida  4   Para  4   Term  4   Preterm      AB      Living  4     SAB      TAB      Ectopic      Multiple      Live Births  4            Home Medications    Prior to Admission medications   Medication Sig Start Date End Date Taking? Authorizing Provider  aspirin-acetaminophen-caffeine (EXCEDRIN MIGRAINE) [phone] MG tablet Take 1 tablet by mouth every 6 (six) hours as needed for headache or migraine.    [provider]  buPROPion (WELLBUTRIN XL) 300 MG 24 hr tablet Take 300 mg by mouth daily.    [provider]  cyclobenzaprine (FLEXERIL) 10 MG tablet Take 10 mg by mouth 3 (three) times daily as needed for muscle spasms.    [provider]  ibuprofen (ADVIL,MOTRIN) 800 MG tablet Take 1 tablet (800 mg total) by mouth every 8 (eight) hours as needed for mild pain or moderate pain. 05/15/18   Ladasia Sircy, Ozella Almond, PA-C  Multiple Vitamins-Minerals (MULTIVITAMIN ADULT PO) Take 1 tablet by mouth daily.    [provider]  mupirocin  ointment (BACTROBAN) 2 % Apply 1 application topically 2 (two) times daily as needed (eczema).    [provider]  nortriptyline (PAMELOR) 25 MG capsule Take 50 mg by mouth at bedtime.     [provider]  ondansetron (ZOFRAN) 8 MG tablet Take 8 mg by mouth every 8 (eight) hours as needed for nausea or vomiting.    [provider]  oxyCODONE (OXY IR/ROXICODONE) 5 MG immediate release tablet Take 5-10 mg by mouth every 6 (six) hours as needed for pain. 12/01/17   [provider]  penciclovir (DENAVIR) 1 % cream Apply 1 application topically daily as needed (cold sores).    [provider]  SUMAtriptan (IMITREX) 100 MG tablet Take 1 tablet (100 mg total) by mouth every 2 (two) hours as needed for migraine. May repeat in 2 hours if headache persists or recurs. 10/28/17   Marcial Pacas, MD    tamsulosin (FLOMAX) 0.4 MG CAPS capsule Take 0.4 mg by mouth as needed (kidney stone).     [provider]  triamcinolone (KENALOG) 0.025 % ointment Apply 1 application topically as needed (eczema).     [provider]  valACYclovir (VALTREX) 1000 MG tablet Take 1,000 mg by mouth as needed (cold sores).     [provider]    Family History Family History  Problem Relation Age of Onset  . COPD Mother   . Hypertension Mother   . Heart disease Mother   . Lung cancer Mother   . Hyperlipidemia Sister   . Hypertension Sister   . Depression Brother   . Drug abuse Brother   . Other Father        unsure of birth father's history  . Multiple myeloma Maternal Aunt   . Cancer Maternal Uncle     Social History Social History   Tobacco Use  . Smoking status: Former Smoker    Packs/day: 1.00    Types: Cigarettes    Last attempt to quit: 11/2008    Years since quitting: 9.4  . Smokeless tobacco: Never Used  Substance Use Topics  . Alcohol use: Yes    Comment: occasionally (1-2 drinks per month)  . Drug use: No     Allergies   Penicillins   Review of Systems Review of Systems  Musculoskeletal: Positive for arthralgias, joint swelling and myalgias.  Skin: Negative for color change and wound.  Neurological: Negative for syncope, weakness and numbness.     Physical Exam Updated Vital Signs BP 123/84 (BP Location: Right Arm)   Pulse 77   Temp 97.8 F (36.6 C) (Oral)   Resp 16   Ht '5\' 4"'  (1.626 m)   Wt 99.8 kg   LMP 05/15/2018   SpO2 99%   BMI 37.76 kg/m   Physical Exam Vitals signs and nursing note reviewed.  Constitutional:      General: She is not in acute distress.    Appearance: She is well-developed.  HENT:     Head: Normocephalic and atraumatic.  Neck:     Musculoskeletal: Neck supple.  Cardiovascular:     Rate and Rhythm: Normal rate and regular rhythm.     Heart sounds: Normal heart sounds. No murmur.  Pulmonary:      Effort: Pulmonary effort is normal. No respiratory distress.     Breath sounds: Normal breath sounds. No wheezing or rales.  Musculoskeletal:     Comments: Right ankle with tenderness to palpation of ATFL and lateral malleolus. + associated swelling. Decreased  ROM likely 2/2 pain. No erythema, ecchymosis, or deformity appreciated. No break in skin. No pain to fifth metatarsal area or navicular region. Achilles intact. Good pedal pulse and cap refill of toes.Sensation grossly intact.  Skin:    General: Skin is warm and dry.  Neurological:     Mental Status: She is alert and oriented to person, place, and time.       ED Treatments / Results  Labs (all labs ordered are listed, but only abnormal results are displayed) Labs Reviewed - No data to display  EKG None  Radiology Dg Ankle Complete Right  Result Date: 05/15/2018 CLINICAL DATA:  Initial encounter for States was walking and twisted right ankle tonight, heard a "pop". Swelling noted to lateral malleolus. H/o torn ligaments to right ankle. EXAM: RIGHT ANKLE - COMPLETE 3+ VIEW COMPARISON:  12/25/2009 FINDINGS: Moderate lateral malleolar soft tissue swelling. No acute fracture or dislocation. Base of fifth metatarsal and talar dome intact. IMPRESSION: Lateral soft tissue swelling, without acute osseous abnormality. Electronically Signed   By: Abigail Miyamoto M.D.   On: 05/15/2018 21:44    Procedures Procedures (including critical care time)  Medications Ordered in ED Medications  ibuprofen (ADVIL,MOTRIN) tablet 800 mg (has no administration in time range)     Initial Impression / Assessment and Plan / ED Course  I have reviewed the triage vital signs and the nursing notes.  Pertinent labs & imaging results that were available during my care of the patient were reviewed by me and considered in my medical decision making (see chart for details).    Katherine Fritz is a 37 y.o. female who presents to ED for acute onset of right  ankle pain after twisting her ankle earlier today.  Neurovascularly intact on exam.  She does have swelling and tenderness to the lateral malleolus and ATFL.  X-ray was obtained which shows soft tissue swelling, but no bony abnormalities. RICE and NSAID home care instructions discussed.  ASO brace and crutches provided.  Orthopedic follow-up if no improvement in 1 week.  Reasons to return to the emergency department were discussed and all questions answered.  Final Clinical Impressions(s) / ED Diagnoses   Final diagnoses:  Sprain of anterior talofibular ligament of right ankle, initial encounter    ED Discharge Orders         Ordered    ibuprofen (ADVIL,MOTRIN) 800 MG tablet  Every 8 hours PRN,   Status:  Discontinued     05/15/18 2218    ibuprofen (ADVIL,MOTRIN) 800 MG tablet  Every 8 hours PRN     05/15/18 2219           Rielynn Trulson, Ozella Almond, PA-C 05/15/18 2240    Daleen Bo, MD 05/16/18 1536

## 2018-05-15 NOTE — Discharge Instructions (Signed)
Ibuprofen as needed for pain. Ice and alleviate for additional pain relief. If your symptoms persist without improvement in one week, please call the orthopedist listed to schedule a follow up appointment.   Return to ER for new or worsening symptoms, any additional concerns.   TREATMENT  Rest, ice, elevation, and compression are the basic modes of treatment.    HOME CARE INSTRUCTIONS  Apply ice to the sore area for 15 to 20 minutes, 3 to 4 times per day. Do this while you are awake for the first 2 days. This can be stopped when the swelling goes away. Put the ice in a plastic bag and place a towel between the bag of ice and your skin.  Keep your leg elevated when possible to lessen swelling.  You may take off your ankle stabilizer at night and to take a shower or bath. Wiggle your toes several times per day if you are able.  ACTIVITY:            - Weight bearing as tolerated - if you can do it, do it. If it hurts, stop.             - Exercises should be limited to pain free range of motion            - Can start mobilization by tracing the alphabet with your foot in the air.

## 2018-05-15 NOTE — ED Triage Notes (Signed)
States was walking and twisted right ankle pain states heard pop about 1500 pm today has been using ice at home.  Has torn ligaments in right ankle before.

## 2018-06-01 ENCOUNTER — Ambulatory Visit: Payer: BLUE CROSS/BLUE SHIELD | Admitting: Adult Health

## 2019-01-01 ENCOUNTER — Emergency Department (HOSPITAL_BASED_OUTPATIENT_CLINIC_OR_DEPARTMENT_OTHER)
Admission: EM | Admit: 2019-01-01 | Discharge: 2019-01-01 | Disposition: A | Discharge status: Home / Self Care | Payer: BLUE CROSS/BLUE SHIELD | Attending: Emergency Medicine | Admitting: Emergency Medicine

## 2019-01-01 ENCOUNTER — Encounter (HOSPITAL_BASED_OUTPATIENT_CLINIC_OR_DEPARTMENT_OTHER): Payer: Self-pay | Admitting: Emergency Medicine

## 2019-01-01 ENCOUNTER — Emergency Department (HOSPITAL_BASED_OUTPATIENT_CLINIC_OR_DEPARTMENT_OTHER): Payer: BLUE CROSS/BLUE SHIELD

## 2019-01-01 ENCOUNTER — Other Ambulatory Visit: Payer: Self-pay

## 2019-01-01 DIAGNOSIS — Z87891 Personal history of nicotine dependence: Secondary | ICD-10-CM | POA: Diagnosis not present

## 2019-01-01 DIAGNOSIS — R109 Unspecified abdominal pain: Secondary | ICD-10-CM | POA: Diagnosis not present

## 2019-01-01 DIAGNOSIS — Z79899 Other long term (current) drug therapy: Secondary | ICD-10-CM | POA: Diagnosis not present

## 2019-01-01 DIAGNOSIS — M546 Pain in thoracic spine: Secondary | ICD-10-CM | POA: Diagnosis not present

## 2019-01-01 LAB — URINALYSIS, ROUTINE W REFLEX MICROSCOPIC
Bilirubin Urine: NEGATIVE
Glucose, UA: NEGATIVE mg/dL
Hgb urine dipstick: NEGATIVE
Ketones, ur: NEGATIVE mg/dL
Leukocytes,Ua: NEGATIVE
Nitrite: NEGATIVE
Protein, ur: NEGATIVE mg/dL
Specific Gravity, Urine: >1.03 — ABNORMAL HIGH (ref 1.005–1.030)
pH: 5.5 (ref 5.0–8.0)

## 2019-01-01 LAB — PREGNANCY, URINE: Preg Test, Ur: NEGATIVE

## 2019-01-01 MED ORDER — KETOROLAC TROMETHAMINE 10 MG PO TABS: 10.0000 mg | ORAL_TABLET | Freq: Once | ORAL | Status: DC

## 2019-01-01 MED ORDER — KETOROLAC TROMETHAMINE 15 MG/ML IJ SOLN
INTRAMUSCULAR | Status: AC
  Filled 2019-01-01: qty 1

## 2019-01-01 MED ORDER — KETOROLAC TROMETHAMINE 15 MG/ML IJ SOLN: 15.0000 mg | Freq: Once | INTRAMUSCULAR | Status: DC

## 2019-01-01 MED ORDER — KETOROLAC TROMETHAMINE 30 MG/ML IJ SOLN
30.0000 mg | Freq: Once | INTRAMUSCULAR | Status: AC
  Administered 2019-01-01: 30 mg via INTRAMUSCULAR

## 2019-01-01 MED ORDER — MORPHINE SULFATE 15 MG PO TABS: 15.0000 mg | ORAL_TABLET | ORAL | 0 refills | Status: DC | PRN

## 2019-01-01 MED ORDER — DICLOFENAC SODIUM 1 % TD GEL: 4.0000 g | Freq: Four times a day (QID) | TRANSDERMAL | 0 refills | Status: DC

## 2019-01-01 NOTE — ED Provider Notes (Signed)
[facility] EMERGENCY DEPARTMENT Provider Note   CSN: [id number] Arrival date & time: 01/01/19  2505    History   Chief Complaint Chief Complaint  Patient presents with   Flank Pain    HPI Katherine Fritz is a 38 y.o. female.  History provided by patient.  Patient presents with thoracic back pain since 7/10.  Notes that since that time it is gotten progressively worse.  She states the pain is localized to the middle of her spine, radiating to the right.  Denies hematuria, fevers, chills, nausea, vomiting.  Denies trauma, recent change in activity or heavy lifting.  States that she has a history of kidney stones and feels that this pain is very similar.  Denies aggravating factors and alleviating factors.  Recently seen at Urgent care on 7/17 and 8/6 for the same.  Given approximal and flexeril on 7/17 and given zanaflex and prednisone taper on 8/6.  Notes that she has taken all of these medications without improvement.  Overnight, took 2 Zanaflex and Tylenol without improvement.  Has also used heating pad with minimal improvement.  Denies rashes.  Denies numbness, tingling, weakness in extremities. Rates pain "70/10."  Past Medical History:  Diagnosis Date   Abnormal Pap smear    Colposcopy normal   Anemia    Depression    Not on current treatment   Headache    History of kidney stones    Kidney stones 2009   MRSA infection    Ovarian cyst    Vaginal Pap smear, abnormal     Patient Active Problem List   Diagnosis Date Noted   MDD (major depressive disorder), recurrent episode, severe ([place]) 12/28/2017   Chronic migraine 39/76/7341   Biliary colic 93/79/0240   General counseling and advice on female contraception 03/14/2013   Tinea pedis 11/08/2010   MOOD SWINGS 09/03/2009   ABNORMAL PAP SMEAR, LGSIL 08/09/2009   OBESITY 07/11/2009   RH FACTOR, NEGATIVE 05/12/2008   ANEMIA, IRON DEFICIENCY, HX OF 04/07/2007   DEPRESSIVE DISORDER, NOS  07/23/2006    Past Surgical History:  Procedure Laterality Date   CHOLECYSTECTOMY     COLPOSCOPY     DENTAL SURGERY     LAPAROSCOPIC BILATERAL SALPINGECTOMY Bilateral 03/19/2016   Procedure: LAPAROSCOPIC BILATERAL SALPINGECTOMY;  Surgeon: Thurnell Lose, MD;  Location: [facility];  Service: Gynecology;  Laterality: Bilateral;     OB History    Gravida  4   Para  4   Term  4   Preterm      AB      Living  4     SAB      TAB      Ectopic      Multiple      Live Births  4            Home Medications    Prior to Admission medications   Medication Sig Start Date End Date Taking? Authorizing Provider  buPROPion (WELLBUTRIN XL) 300 MG 24 hr tablet Take 300 mg by mouth daily.   Yes [provider]  Multiple Vitamins-Minerals (MULTIVITAMIN ADULT PO) Take 1 tablet by mouth daily.   Yes [provider]  nortriptyline (PAMELOR) 25 MG capsule Take 50 mg by mouth at bedtime.    Yes [provider]  ondansetron (ZOFRAN) 8 MG tablet Take 8 mg by mouth every 8 (eight) hours as needed for nausea or vomiting.   Yes [provider]  oxyCODONE (OXY IR/ROXICODONE) 5 MG  immediate release tablet Take 5-10 mg by mouth every 6 (six) hours as needed for pain. 12/01/17  Yes [provider]  valACYclovir (VALTREX) 1000 MG tablet Take 1,000 mg by mouth as needed (cold sores).    Yes [provider]  aspirin-acetaminophen-caffeine (EXCEDRIN MIGRAINE) [phone] MG tablet Take 1 tablet by mouth every 6 (six) hours as needed for headache or migraine.    [provider]  cyclobenzaprine (FLEXERIL) 10 MG tablet Take 10 mg by mouth 3 (three) times daily as needed for muscle spasms.    [provider]  diclofenac sodium (VOLTAREN) 1 % GEL Apply 4 g topically 4 (four) times daily. 01/01/19   Kathia Covington, Bernita Raisin, DO  ibuprofen (ADVIL,MOTRIN) 800 MG tablet Take 1 tablet (800 mg total) by mouth every 8 (eight) hours as needed for  mild pain or moderate pain. 05/15/18   Ward, Ozella Almond, PA-C  morphine (MSIR) 15 MG tablet Take 1 tablet (15 mg total) by mouth every 4 (four) hours as needed for severe pain. 01/01/19   Elzina Devera, Bernita Raisin, DO  mupirocin ointment (BACTROBAN) 2 % Apply 1 application topically 2 (two) times daily as needed (eczema).    [provider]  naproxen (NAPROSYN) 500 MG tablet TAKE 1 TABLET (500 MG TOTAL) BY MOUTH 2 TIMES DAILY WITH MEALS FOR 10 DAYS. 12/10/18   [provider]  penciclovir (DENAVIR) 1 % cream Apply 1 application topically daily as needed (cold sores).    [provider]  predniSONE (DELTASONE) 10 MG tablet  12/30/18   [provider]  SUMAtriptan (IMITREX) 100 MG tablet Take 1 tablet (100 mg total) by mouth every 2 (two) hours as needed for migraine. May repeat in 2 hours if headache persists or recurs. 10/28/17   Marcial Pacas, MD  tamsulosin (FLOMAX) 0.4 MG CAPS capsule Take 0.4 mg by mouth as needed (kidney stone).     [provider]  tiZANidine (ZANAFLEX) 2 MG tablet  12/30/18   [provider]  triamcinolone (KENALOG) 0.025 % ointment Apply 1 application topically as needed (eczema).     [provider]    Family History Family History  Problem Relation Age of Onset   COPD Mother    Hypertension Mother    Heart disease Mother    Lung cancer Mother    Hyperlipidemia Sister    Hypertension Sister    Depression Brother    Drug abuse Brother    Other Father        unsure of birth father's history   Multiple myeloma Maternal Aunt    Cancer Maternal Uncle     Social History Social History   Tobacco Use   Smoking status: Former Smoker    Packs/day: 1.00    Types: Cigarettes    Quit date: 11/2008    Years since quitting: 10.1   Smokeless tobacco: Never Used  Substance Use Topics   Alcohol use: Yes    Comment: occasionally (1-2 drinks per month)   Drug use: Not on file     Allergies     Penicillins   Review of Systems Review of Systems  Constitutional: Negative for chills and fever.  HENT: Negative for congestion and sore throat.   Respiratory: Negative for cough and shortness of breath.   Cardiovascular: Negative for chest pain.  Gastrointestinal: Negative for abdominal pain, nausea and vomiting.  Genitourinary: Positive for flank pain. Negative for difficulty urinating, dysuria and hematuria.  Musculoskeletal: Positive for back pain. Negative for gait  problem and neck pain.       No trauma  Skin: Negative for rash.  Neurological: Positive for headaches (one headache yesterday, usual headache character). Negative for dizziness, weakness and numbness.     Physical Exam Updated Vital Signs BP (!) 119/92 (BP Location: Right Arm)    Pulse 86    Temp 98.3 F (36.8 C) (Oral)    Resp 20    Ht '5\' 4"'  (0.973 m)    Wt 104.3 kg    LMP 12/03/2018    SpO2 100%    BMI 39.48 kg/m   Physical Exam Constitutional:      Comments: Crying  HENT:     Head: Normocephalic and atraumatic.  Eyes:     Pupils: Pupils are equal, round, and reactive to light.  Neck:     Musculoskeletal: Normal range of motion. No neck rigidity or muscular tenderness.  Cardiovascular:     Rate and Rhythm: Normal rate and regular rhythm.     Pulses: Normal pulses.     Heart sounds: No murmur.  Pulmonary:     Effort: Pulmonary effort is normal. No respiratory distress.     Breath sounds: Wheezing present.  Abdominal:     Palpations: Abdomen is soft.     Tenderness: There is abdominal tenderness (RUQ that radiates to back with palpation). There is right CVA tenderness. There is no left CVA tenderness or guarding.  Musculoskeletal:     Thoracic back: She exhibits no swelling and no deformity.       Back:     Right lower leg: No edema.     Left lower leg: No edema.     Comments: No tenderness to palpation of thoracic spinous processes, no tenderness palpation of lumbar spine, no rash or overlying  deformity noted  Skin:    General: Skin is warm and dry.     Findings: No lesion or rash.  Neurological:     Mental Status: She is alert and oriented to person, place, and time.     Sensory: No sensory deficit.     Gait: Gait normal.  Psychiatric:     Comments: Tearful on exam      ED Treatments / Results  Labs (all labs ordered are listed, but only abnormal results are displayed) Labs Reviewed  URINALYSIS, ROUTINE W REFLEX MICROSCOPIC - Abnormal; Notable for the following components:      Result Value   Specific Gravity, Urine >1.030 (*)    All other components within normal limits  PREGNANCY, URINE    EKG None  Radiology Ct Renal Stone Study  Result Date: 01/01/2019 CLINICAL DATA:  37 year old female with history of right-sided flank pain worsening over past month. EXAM: CT ABDOMEN AND PELVIS WITHOUT CONTRAST TECHNIQUE: Multidetector CT imaging of the abdomen and pelvis was performed following the standard protocol without IV contrast. COMPARISON:  CT the abdomen and pelvis 12/03/2017. FINDINGS: Lower chest: Unremarkable. Hepatobiliary: No definite cystic or solid hepatic lesions are confidently identified on today's noncontrast CT examination. Status post cholecystectomy. Pancreas: No definite pancreatic mass or peripancreatic fluid collections or inflammatory changes are noted on today's noncontrast CT examination. Spleen: Unremarkable. Adrenals/Urinary Tract: Multiple tiny 2-3 mm nonobstructive calculi are noted within the collecting systems of both kidneys. No additional calculi are noted along the course of either ureter or within the lumen of the urinary bladder. No hydroureteronephrosis. Unenhanced appearance of the kidneys, bilateral adrenal glands and urinary bladder is otherwise unremarkable. Stomach/Bowel: Normal appearance of the stomach.  No pathologic dilatation of small bowel or colon. Normal appendix. Vascular/Lymphatic: No atherosclerotic calcifications in the  abdominal aorta. No lymphadenopathy identified in the abdomen or pelvis. Reproductive: 2.8 cm intermediate attenuation lesion in the posterior aspect of the uterine body, most likely to represent a fibroid. Ovaries are unremarkable in appearance. Other: No significant volume of ascites.  No pneumoperitoneum. Musculoskeletal: There are no aggressive appearing lytic or blastic lesions noted in the visualized portions of the skeleton. IMPRESSION: 1. Multiple nonobstructive calculi measuring 2-3 mm in the collecting systems of both kidneys. No ureteral stones or findings of urinary tract obstruction are noted at this time. 2. Additional incidental findings, as above. Electronically Signed   By: Vinnie Langton M.D.   On: 01/01/2019 10:18    Procedures Procedures (including critical care time)  Medications Ordered in ED Medications  ketorolac (TORADOL) 30 MG/ML injection 30 mg (30 mg Intramuscular Given 01/01/19 0926)     Initial Impression / Assessment and Plan / ED Course  I have reviewed the triage vital signs and the nursing notes.  Pertinent labs & imaging results that were available during my care of the patient were reviewed by me and considered in my medical decision making (see chart for details).  Katherine Fritz is a 37 year old female with past medical history of MDD and recurrent kidney stones who presents with right-sided flank pain.  Patient is very tender to palpation and appears to be in T9 dermatomal distribution, but without rash at this time, cannot exclude shingles from differential.  No red flag symptoms including numbness or tingling in extremities, sensation changes, trauma.  UA negative for hematuria, but given patient's history of nephrolithiasis as well as report of similar pain with nephrolithiasis, will obtain CT renal protocol to assess.  Reassured that patient is currently without fever and vital signs are stable.  Renal CT without evidence of nephro lithiasis.  No acute  bony abnormalities noted.  Unclear cause of pain.  Reassured that patient's vital signs are stable.  Doubt pulmonary embolism given lack of tachycardia, hypoxia.  She remains uncomfortable following Toradol injection.  Patient will be given prescription for as needed morphine, Voltaren gel.  Results and plan discussed with patient, she agrees to this.  She was given appropriate return precautions and advised to follow-up with her PCP for further management.   Final Clinical Impressions(s) / ED Diagnoses   Final diagnoses:  Acute right-sided thoracic back pain    ED Discharge Orders         Ordered    morphine (MSIR) 15 MG tablet  Every 4 hours PRN     01/01/19 1031    diclofenac sodium (VOLTAREN) 1 % GEL  4 times daily     01/01/19 1031           Collie Wernick, Bernita Raisin, DO 01/01/19 1047    Margette Fast, MD 01/01/19 2035

## 2019-01-01 NOTE — ED Triage Notes (Signed)
Mid back pain x 2 weeks, has been seen 2x for same at Urgent Care, has been given steroids and muscle relaxer's . Denies SOB or injury

## 2019-01-01 NOTE — ED Notes (Signed)
Patient transported to CT 

## 2019-01-01 NOTE — Discharge Instructions (Addendum)
Your imaging is looked fine, he did not have any evidence of kidney stones on your scan or urine test.  Your pain is likely musculoskeletal in origin.  It is also possible that you may develop shingles.  You should return to care if you have severe worsening pain, fever, shortness of breath, chest pain.  Take medication we prescribed as needed.  You should stop taking the other medications that you have been prescribed for this pain, except prednisone.  You may also use the gel on your back to improve the pain.  Follow-up with your primary doctor if symptoms worsen or you do not have improvement in the next few days.

## 2019-01-01 NOTE — ED Notes (Signed)
ED Provider at bedside. 

## 2019-09-05 ENCOUNTER — Other Ambulatory Visit: Payer: Self-pay | Admitting: Family Medicine

## 2019-09-05 ENCOUNTER — Ambulatory Visit
Admission: RE | Admit: 2019-09-05 | Discharge: 2019-09-05 | Disposition: A | Discharge status: Home / Self Care | Payer: 59 | Source: Ambulatory Visit | Attending: Family Medicine | Admitting: Family Medicine

## 2019-09-05 DIAGNOSIS — T1490XA Injury, unspecified, initial encounter: Secondary | ICD-10-CM

## 2020-07-02 IMAGING — MR MR LUMBAR SPINE W/O CM
5 series · 45 of 48 positions shown · non-contrast
Comparison: CT abdomen and pelvis 12/03/2017

CLINICAL DATA: Low back pain and left-sided abdominal pain for 2
years.

EXAM:
MRI LUMBAR SPINE WITHOUT CONTRAST
TECHNIQUE: Multiplanar, multisequence MR imaging of the lumbar spine was
performed. No intravenous contrast was administered.

[Series 2: T2 post-contrast · sagittal · 4.0mm · 0.88mm/px · 6 of 12 slices shown]
[im 1/12]
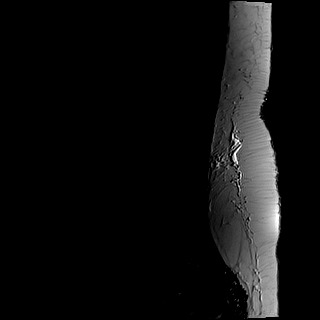
[im 3/12]
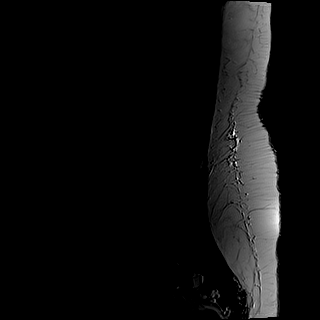
[im 5/12]
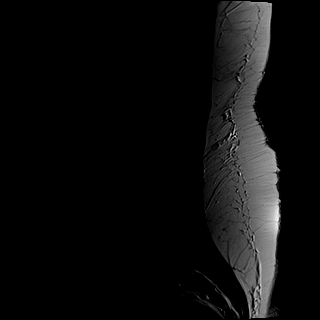
[im 7/12]
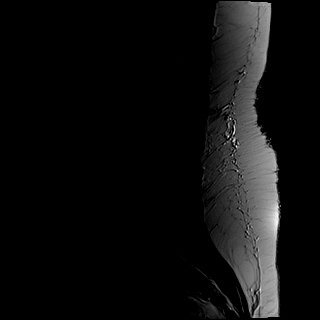
[im 9/12]
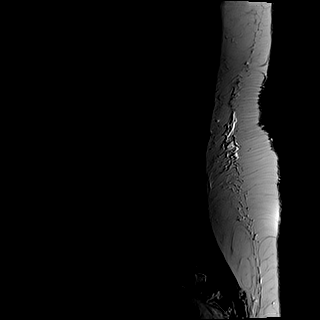
[im 12/12]
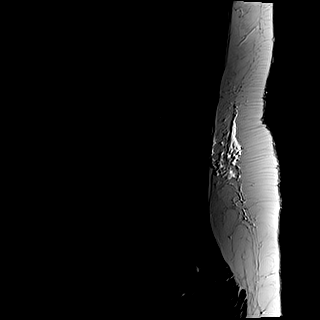

[Series 3: T1 · sagittal · 4.0mm · 0.88mm/px · 6 of 12 slices shown (1 of 2)]
[im 1/12]
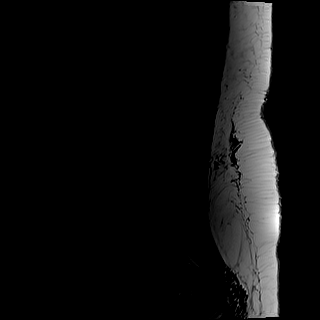
[im 3/12]
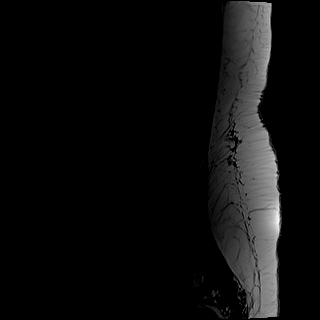
[im 5/12]
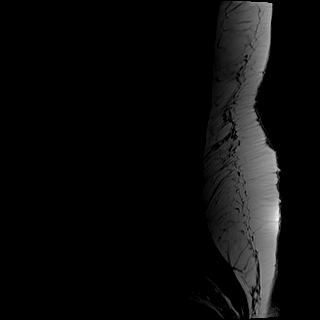
[im 7/12]
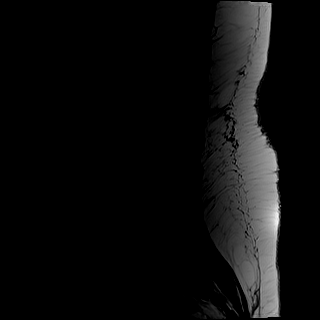
[im 9/12]
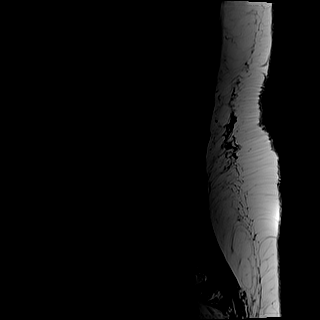
[im 12/12]
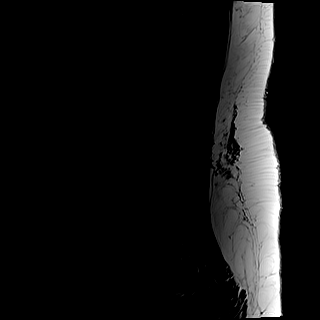

[Series 4: tirm sag · sagittal · 4.0mm · 0.55mm/px · 6 of 12 slices shown]
[im 1/12]
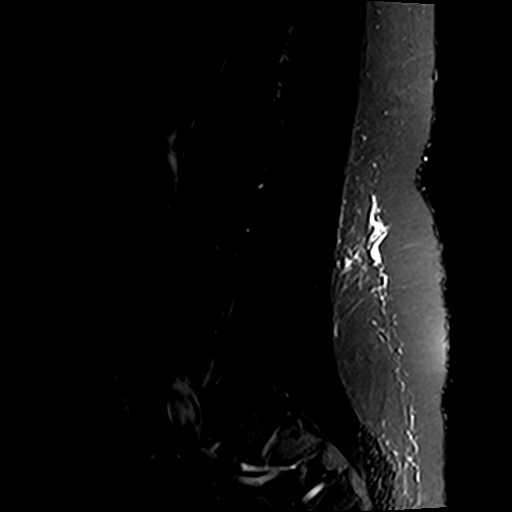
[im 3/12]
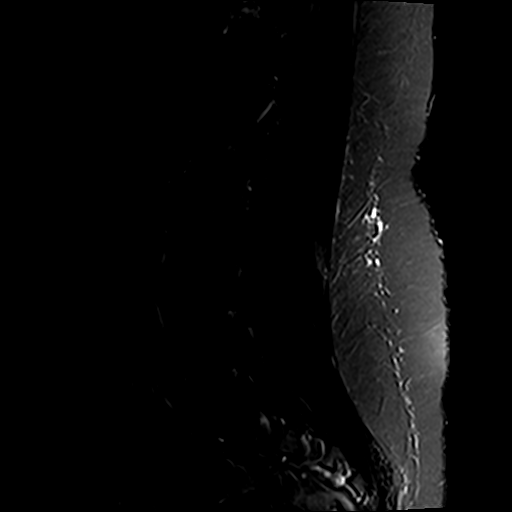
[im 5/12]
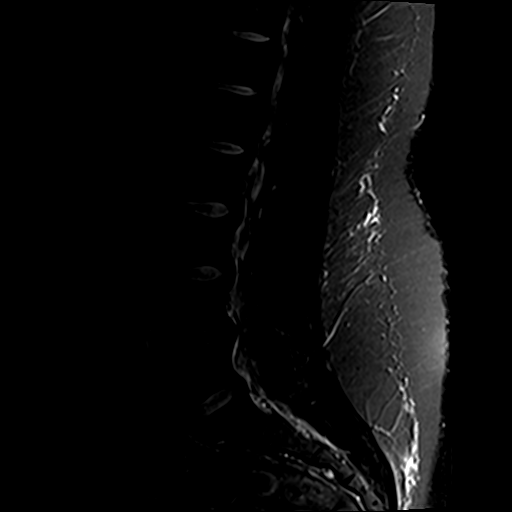
[im 7/12]
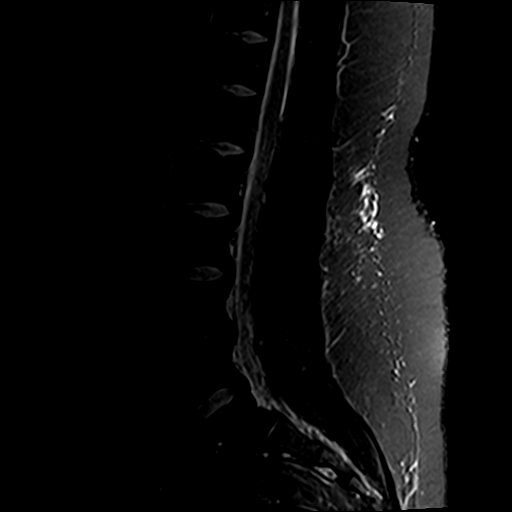
[im 9/12]
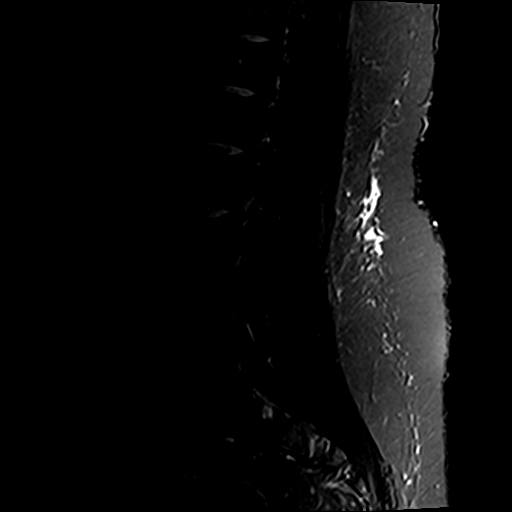
[im 12/12]
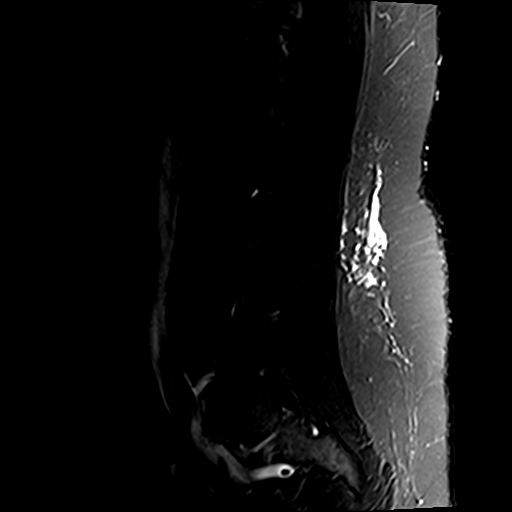

[Series 5: T1 · axial · 4.0mm · 0.78mm/px · z∈[-100,+105]mm · 12 of 32 slices shown (2 of 2)]
[im 1/32]
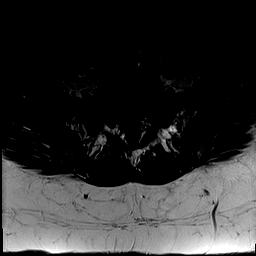
[im 3/32]
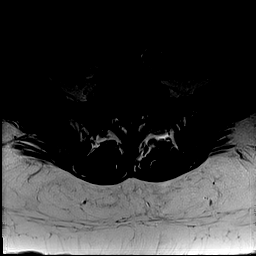
[im 5/32]
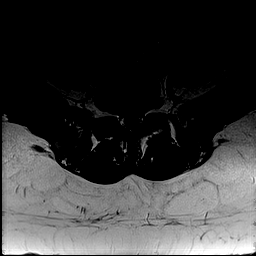
[im 7/32]
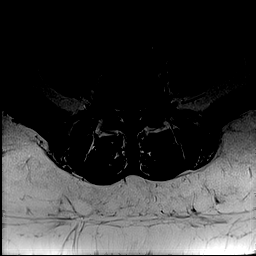
[im 9/32]
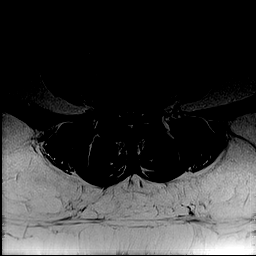
[im 12/32]
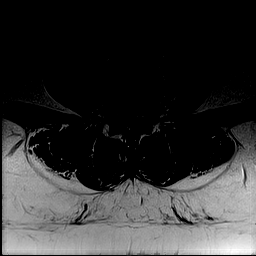
[im 14/32]
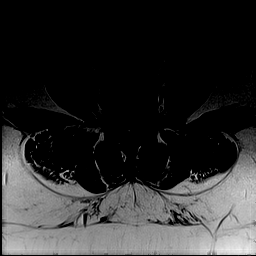
[im 16/32]
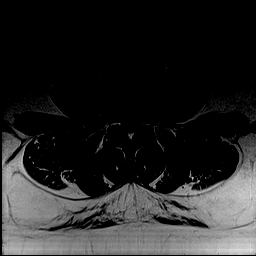
[im 18/32]
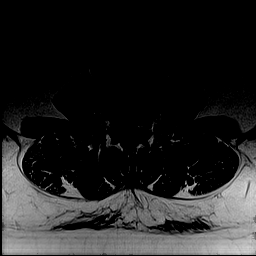
[im 23/32]
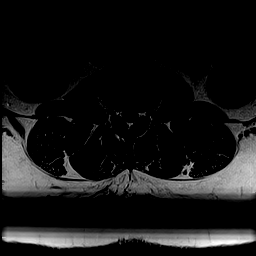
[im 27/32]
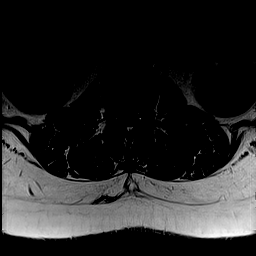
[im 32/32]
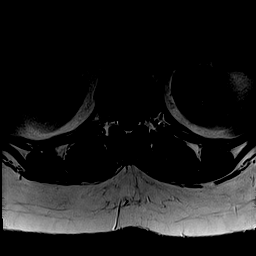

[Series 6: T2 · axial · 4.0mm · 0.78mm/px · z∈[-100,+105]mm · 15 of 32 slices shown]
[im 1/32]
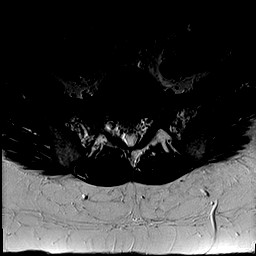
[im 3/32]
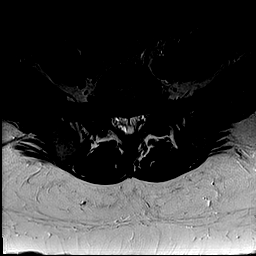
[im 5/32]
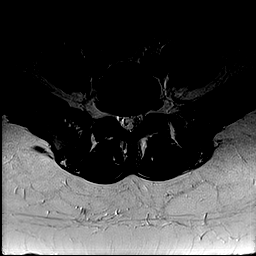
[im 7/32]
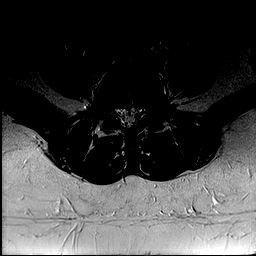
[im 9/32]
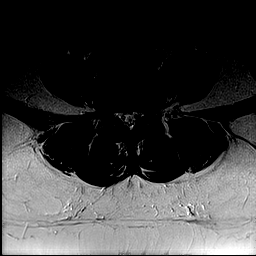
[im 12/32]
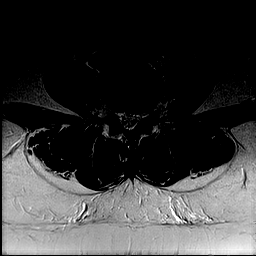
[im 14/32]
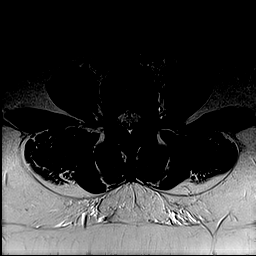
[im 16/32]
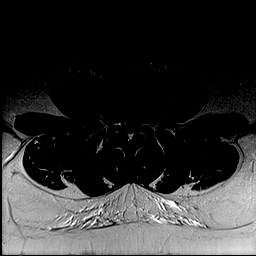
[im 18/32]
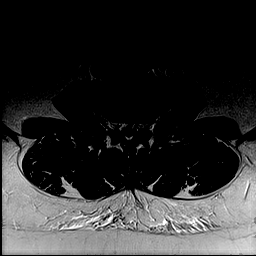
[im 20/32]
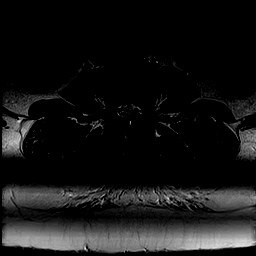
[im 23/32]
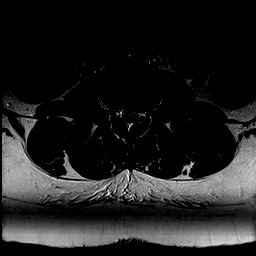
[im 25/32]
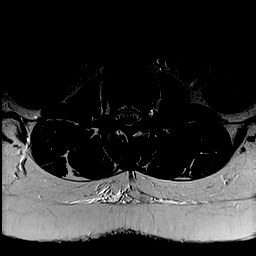
[im 27/32]
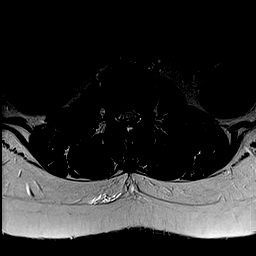
[im 29/32]
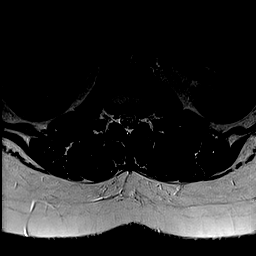
[im 32/32]
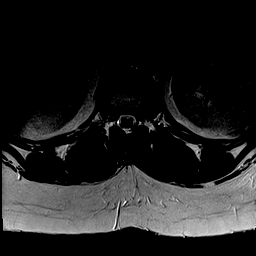

[45 of 48 positions shown; findings below may reference images not displayed]

FINDINGS: Segmentation:  Standard.

Alignment:  Normal.

Vertebrae: No fracture, suspicious osseous lesion, or significant
marrow edema.

Conus medullaris and cauda equina: Conus extends to the L1-2 level.
Conus and cauda equina appear normal.

Paraspinal and other soft tissues: Unremarkable.

Disc levels:

The lumbar spinal canal is diffusely small in caliber due to
congenitally short pedicles.

T12-L1 through L3-4: No disc herniation or significant acquired
stenosis.

L4-5: Disc desiccation. Mild circumferential disc bulging slightly
greater to the left and congenitally short pedicles result in mild
left greater than right neural foraminal stenosis without
significant acquired spinal stenosis.

L5-S1: Negative.
IMPRESSION: 1. Congenitally short pedicles without significant acquired spinal
stenosis.
2. Mild L4-5 disc bulging with mild bilateral neural foraminal
stenosis.

## 2021-10-09 ENCOUNTER — Other Ambulatory Visit: Payer: Self-pay | Admitting: Radiology

## 2022-03-20 ENCOUNTER — Encounter: Payer: Self-pay | Admitting: Dietician

## 2022-03-20 ENCOUNTER — Encounter: Payer: Commercial Managed Care - HMO | Attending: Family Medicine | Admitting: Dietician

## 2022-03-20 DIAGNOSIS — Z713 Dietary counseling and surveillance: Secondary | ICD-10-CM | POA: Diagnosis not present

## 2022-03-20 DIAGNOSIS — E669 Obesity, unspecified: Secondary | ICD-10-CM | POA: Diagnosis present

## 2022-03-20 NOTE — Progress Notes (Signed)
Medical Nutrition Therapy  Appointment Start time:  [phone]  Appointment End time:  8115  Primary concerns today: Pt states she was talking to her doctor about weight loss and talked to her doctor about weight loss medication but it was not covered under insurance. She states her doctor didn't think she was eating enough which is why she was referred to RD.  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  Pt lives with husband, daughter and son. Pt does grocery shopping and some cooking. Pt states they eat out and cook in about 50/50. Pt does not enjoy cooking and likes simple meals.   Pt works 8:30-1:30pm M-F.   Pt has her breakfast shake at 6am and then doesn't eat at work but gets hungry around 11am but skips past that feeling. When she gets off work she gets something out before she gets her kids from school or sometimes she waits till dinner.   Pt enjoys walking and used to go walking at [employer] but now she has to go straight to get her kids from school after work.   Pt has a treadmill at home but has not been using it.  Pt states they have been eating out a lot because of time and effort required for cooking at home. Pt's husband does not enjoy cooking.   Estimated daily fluid intake: 48 oz Supplements: none Sleep: 7 hours Stress / self-care: mild to moderate stress Current average weekly physical activity: ADLs  24-Hr Dietary Recall First Meal: carnation instant breakfast high protein shake Snack: none Second Meal: none Snack: none OR fish filet mcdonalds meal OR buffalo chicken sandwich Third Meal: 6/7pm: at home: burgers OR spaghetti with meat sauce and  peas or alfredo sauce with broccoli OR texas roadhouse steak with corn and salad OR pizza  Snack: none Beverages: coffee with flavored creamer, 3 water bottles, occasional soda    NUTRITION DIAGNOSIS  NB-1.1 Food and nutrition-related knowledge deficit As related to lack of prior nutrition education.  As evidenced by pt report and diet history.   NUTRITION INTERVENTION  Nutrition education (E-1) on the following topics:  Building balanced snacks Importance of adequate hydration Consistent mealtimes Balance of carbohydrate, protein, fruits and vegetables MyPlate Saturated vs unsaturated fats Foods in food groups Benefits of physical activity and goals Strategies for easy meals at home  Handouts Provided Include  Dish Up a Healthy Meal Snack Suggestions Balanced Plate Food Groups  Learning Style & Readiness for Change Teaching method utilized: Visual & Auditory  Demonstrated degree of understanding via: Teach Back  Barriers to learning/adherence to lifestyle change: none  Goals Established by Pt Aim for 150 minutes of physical activity weekly. -Start with 2-3x/wk -keep track of how many minutes weekly if this helps -use your treadmill or the outdoors  Eat more Non-Starchy Vegetables  Minimize added sugars and refined grains.  Rethink what you drink. Choose beverages without added sugar. Look for 0 carbs on the label.  Choose whole foods over processed.  Make simple meals at home more often than eating out.  Aim to eat within 1-2 hours of waking up and every 3-5 hours following.  6am, 10am, 2pm, 6/7pm.  Snack ideas:  Apple or banana and peanut  butter Greek yogurt and fruit  Wheat crackers and peanut butter Chicken salad and wheat crackers String cheese or baby bell and fruit Nuts and fruit   MONITORING & EVALUATION Dietary intake, weekly physical activity, and follow up in 10 weeks.  Next Steps  Patient is to call for questions.

## 2022-03-20 NOTE — Patient Instructions (Addendum)
Aim for 150 minutes of physical activity weekly. -Start with 2-3x/wk -keep track of how many minutes weekly if this helps -use your treadmill or the outdoors  Eat more Non-Starchy Vegetables  Minimize added sugars and refined grains.  Rethink what you drink. Choose beverages without added sugar. Look for 0 carbs on the label.  Choose whole foods over processed.  Make simple meals at home more often than eating out.  Aim to eat within 1-2 hours of waking up and every 3-5 hours following.  6am, 10am, 2pm, 6/7pm.  Snack ideas:  Apple or banana and peanut butter Greek yogurt and fruit  Wheat crackers and peanut butter Chicken salad and wheat crackers String cheese or baby bell and fruit Nuts and fruit

## 2022-04-27 ENCOUNTER — Other Ambulatory Visit: Payer: Self-pay

## 2022-04-27 ENCOUNTER — Emergency Department (HOSPITAL_BASED_OUTPATIENT_CLINIC_OR_DEPARTMENT_OTHER): Payer: Commercial Managed Care - HMO | Admitting: Radiology

## 2022-04-27 ENCOUNTER — Encounter (HOSPITAL_BASED_OUTPATIENT_CLINIC_OR_DEPARTMENT_OTHER): Payer: Self-pay | Admitting: Emergency Medicine

## 2022-04-27 ENCOUNTER — Emergency Department (HOSPITAL_BASED_OUTPATIENT_CLINIC_OR_DEPARTMENT_OTHER)
Admission: EM | Admit: 2022-04-27 | Discharge: 2022-04-27 | Disposition: A | Discharge status: Home / Self Care | Payer: Commercial Managed Care - HMO | Attending: Emergency Medicine | Admitting: Emergency Medicine

## 2022-04-27 DIAGNOSIS — M545 Low back pain, unspecified: Secondary | ICD-10-CM | POA: Diagnosis present

## 2022-04-27 DIAGNOSIS — M5441 Lumbago with sciatica, right side: Secondary | ICD-10-CM | POA: Insufficient documentation

## 2022-04-27 DIAGNOSIS — W109XXA Fall (on) (from) unspecified stairs and steps, initial encounter: Secondary | ICD-10-CM | POA: Insufficient documentation

## 2022-04-27 DIAGNOSIS — M5442 Lumbago with sciatica, left side: Secondary | ICD-10-CM | POA: Insufficient documentation

## 2022-04-27 DIAGNOSIS — W19XXXA Unspecified fall, initial encounter: Secondary | ICD-10-CM

## 2022-04-27 MED ORDER — METHOCARBAMOL 500 MG PO TABS: 500.0000 mg | ORAL_TABLET | Freq: Two times a day (BID) | ORAL | 0 refills | Status: DC

## 2022-04-27 MED ORDER — OXYCODONE-ACETAMINOPHEN 5-325 MG PO TABS
2.0000 | ORAL_TABLET | Freq: Once | ORAL | Status: AC
  Administered 2022-04-27: 2 via ORAL
  Filled 2022-04-27: qty 2

## 2022-04-27 MED ORDER — MELOXICAM 7.5 MG PO TABS: 7.5000 mg | ORAL_TABLET | Freq: Every day | ORAL | 0 refills | Status: DC

## 2022-04-27 MED ORDER — METHOCARBAMOL 500 MG PO TABS
1000.0000 mg | ORAL_TABLET | Freq: Once | ORAL | Status: AC
  Administered 2022-04-27: 1000 mg via ORAL
  Filled 2022-04-27: qty 2

## 2022-04-27 NOTE — Discharge Instructions (Addendum)
Please take Robaxin as prescribed Please take acetaminophen 650 mg every 4 hours Do not take any additional nsaid In addition please use hot and cold therapy Return immediately if you have loss of bowel or bladder control, or new weakness Please do gentle movement only such as walking and gentle stretching without any heavy lifting or exertion Please recheck with your doctor if your symptoms not improved in 2 to 4 days

## 2022-04-27 NOTE — ED Provider Notes (Signed)
[facility]GENC[facility] DEPT Provider Note   CSN: [id number] Arrival date & time: 04/27/22  0820     History  Chief Complaint  Patient presents with   Lytle Michaels    Katherine Fritz is a 41 y.o. female.  HPI 41 year old female fell down 4 steps last Saturday.  She landed in a sitting position.  She had some pain in her low back at that time but was able to get up.  She saw the chiropractor several times this week.  After last adjustment she had worsening pain in her low back.  Pain is worse with certain leg movement and positions.  She has taken any medications.  She has used some hot and cold therapy.  She has not had loss of bowel or bladder control though she states that sometimes due to pain she has slowed down being able to get to the bathroom and has urinated.  She has pain with movement but no loss of strength.  She denies injury to her head, neck, trunk, or extremities     Home Medications Prior to Admission medications   Medication Sig Start Date End Date Taking? Authorizing Provider  methocarbamol (ROBAXIN) 500 MG tablet Take 1 tablet (500 mg total) by mouth 2 (two) times daily. 04/27/22  Yes Pattricia Boss, MD  aspirin-acetaminophen-caffeine (EXCEDRIN MIGRAINE) [phone] MG tablet Take 1 tablet by mouth every 6 (six) hours as needed for headache or migraine.    [provider]  buPROPion (WELLBUTRIN XL) 300 MG 24 hr tablet Take 300 mg by mouth daily.    [provider]  cyclobenzaprine (FLEXERIL) 10 MG tablet Take 10 mg by mouth 3 (three) times daily as needed for muscle spasms.    [provider]  diclofenac sodium (VOLTAREN) 1 % GEL Apply 4 g topically 4 (four) times daily. 01/01/19   Meccariello, Bernita Raisin, MD  ibuprofen (ADVIL,MOTRIN) 800 MG tablet Take 1 tablet (800 mg total) by mouth every 8 (eight) hours as needed for mild pain or moderate pain. 05/15/18   Ward, Ozella Almond, PA-C  morphine (MSIR) 15 MG tablet Take 1 tablet (15 mg total)  by mouth every 4 (four) hours as needed for severe pain. 01/01/19   Meccariello, Bernita Raisin, MD  Multiple Vitamins-Minerals (MULTIVITAMIN ADULT PO) Take 1 tablet by mouth daily.    [provider]  mupirocin ointment (BACTROBAN) 2 % Apply 1 application topically 2 (two) times daily as needed (eczema).    [provider]  naproxen (NAPROSYN) 500 MG tablet TAKE 1 TABLET (500 MG TOTAL) BY MOUTH 2 TIMES DAILY WITH MEALS FOR 10 DAYS. 12/10/18   [provider]  nortriptyline (PAMELOR) 25 MG capsule Take 50 mg by mouth at bedtime.     [provider]  ondansetron (ZOFRAN) 8 MG tablet Take 8 mg by mouth every 8 (eight) hours as needed for nausea or vomiting.    [provider]  oxyCODONE (OXY IR/ROXICODONE) 5 MG immediate release tablet Take 5-10 mg by mouth every 6 (six) hours as needed for pain. 12/01/17   [provider]  penciclovir (DENAVIR) 1 % cream Apply 1 application topically daily as needed (cold sores).    [provider]  predniSONE (DELTASONE) 10 MG tablet  12/30/18   [provider]  SUMAtriptan (IMITREX) 100 MG tablet Take 1 tablet (100 mg total) by mouth every 2 (two) hours as needed for migraine. May repeat in 2 hours if headache persists or recurs. 10/28/17   Marcial Pacas,  MD  tamsulosin (FLOMAX) 0.4 MG CAPS capsule Take 0.4 mg by mouth as needed (kidney stone).     [provider]  tiZANidine (ZANAFLEX) 2 MG tablet  12/30/18   [provider]  triamcinolone (KENALOG) 0.025 % ointment Apply 1 application topically as needed (eczema).     [provider]  valACYclovir (VALTREX) 1000 MG tablet Take 1,000 mg by mouth as needed (cold sores).     [provider]      Allergies    Penicillins    Review of Systems   Review of Systems  Physical Exam Updated Vital Signs BP 126/68   Pulse 85   Temp 98.6 F (37 C) (Oral)   Resp 18   SpO2 100%  Physical Exam Vitals and nursing note reviewed.   HENT:     Head: Normocephalic.     Right Ear: External ear normal.     Left Ear: External ear normal.     Nose: Nose normal.     Mouth/Throat:     Pharynx: Oropharynx is clear.  Eyes:     Extraocular Movements: Extraocular movements intact.     Pupils: Pupils are equal, round, and reactive to light.  Cardiovascular:     Rate and Rhythm: Normal rate and regular rhythm.     Pulses: Normal pulses.  Pulmonary:     Effort: Pulmonary effort is normal.     Breath sounds: Normal breath sounds.  Abdominal:     General: Abdomen is flat. Bowel sounds are normal.     Palpations: Abdomen is soft.  Musculoskeletal:        General: Normal range of motion.     Cervical back: Normal range of motion.     Comments: Back is visually examined and no obvious external signs of trauma are noted There is some diffuse tenderness in the midline on the lumbar spine down to the sacrum No step-offs are noted  Skin:    General: Skin is warm and dry.     Capillary Refill: Capillary refill takes less than 2 seconds.  Neurological:     General: No focal deficit present.     Mental Status: She is alert.     Sensory: No sensory deficit.     Motor: No weakness.     Coordination: Coordination normal.  Psychiatric:        Mood and Affect: Mood normal.        Behavior: Behavior normal.     ED Results / Procedures / Treatments   Labs (all labs ordered are listed, but only abnormal results are displayed) Labs Reviewed - No data to display  EKG None  Radiology DG Pelvis 1-2 Views  Result Date: 04/27/2022 CLINICAL DATA:  Status post fall on Saturday with pelvic pain. EXAM: PELVIS - 1-2 VIEW COMPARISON:  January 31, 2009 FINDINGS: There is no evidence of pelvic fracture or dislocation. No pelvic bone lesions are seen. Pelvic phleboliths are noted. IMPRESSION: Negative. Electronically Signed   By: Abelardo Diesel M.D.   On: 04/27/2022 09:42   DG Lumbar Spine 2-3 Views  Result Date: 04/27/2022 CLINICAL  DATA:  41 year old female with history of low back pain after falling down stairs 1 week ago. EXAM: LUMBAR SPINE - 2-3 VIEW COMPARISON:  No priors. FINDINGS: There is no evidence of lumbar spine fracture. Alignment is normal. Intervertebral disc spaces are maintained. Surgical clips project over the right upper quadrant of the abdomen, likely from prior cholecystectomy. IMPRESSION: Negative. Electronically Signed  By: Vinnie Langton M.D.   On: 04/27/2022 09:40    Procedures Procedures    Medications Ordered in ED Medications  oxyCODONE-acetaminophen (PERCOCET/ROXICET) 5-325 MG per tablet 2 tablet (2 tablets Oral Given 04/27/22 0939)  methocarbamol (ROBAXIN) tablet 1,000 mg (1,000 mg Oral Given 04/27/22 8325)    ED Course/ Medical Decision Making/ A&P Clinical Course as of 04/27/22 0957  Sun Apr 27, 2022  0947 Pelvis and lumbar spine x-rays reviewed and personally interpreted with no evidence of acute abnormality and radiologist interpretation concurs [DR]    Clinical Course User Index [DR] Pattricia Boss, MD                           Medical Decision Making Patient with fall down steps and low back pain Differential diagnosis includes but is not limited to acute fracture lumbar spine, acute fracture pelvis, muscular strain, contusion, and other traumatic injuries. Patient evaluated with x-rays and no evidence of fracture noted in lumbar spine or pelvis Patient appears to be neurologically intact although she does have pain with movement. She is treated here with Percocet and Robaxin. Plan Robaxin, acetaminophen, ibuprofen at home. She is advised regarding return precautions and need for follow-up and voices understanding  Amount and/or Complexity of Data Reviewed Radiology: ordered and independent interpretation performed. Decision-making details documented in ED Course.  Risk Prescription drug management.          Final Clinical Impression(s) / ED Diagnoses Final  diagnoses:  Fall, initial encounter  Acute bilateral low back pain with bilateral sciatica    Rx / DC Orders ED Discharge Orders          Ordered    methocarbamol (ROBAXIN) 500 MG tablet  2 times daily        04/27/22 0957              Pattricia Boss, MD 04/27/22 [id number]

## 2022-04-27 NOTE — ED Triage Notes (Signed)
Pt fell down 4 steps last Saturday ,landing on her lower back and sliding down the steps. She went to chiropractor 3 times this week, no better. Friday she awakened and her lower back still hurting, and left hip painful, so painful she can't bear weight and she feels restricted in her movements. The whole left leg is sore/painful. No swelling noted in triage,positive pulses.

## 2022-06-09 ENCOUNTER — Ambulatory Visit: Payer: Commercial Managed Care - HMO | Admitting: Dietician

## 2022-07-06 ENCOUNTER — Emergency Department (HOSPITAL_BASED_OUTPATIENT_CLINIC_OR_DEPARTMENT_OTHER): Payer: Commercial Managed Care - HMO

## 2022-07-06 ENCOUNTER — Encounter (HOSPITAL_BASED_OUTPATIENT_CLINIC_OR_DEPARTMENT_OTHER): Payer: Self-pay | Admitting: Emergency Medicine

## 2022-07-06 ENCOUNTER — Other Ambulatory Visit: Payer: Self-pay

## 2022-07-06 ENCOUNTER — Emergency Department (HOSPITAL_BASED_OUTPATIENT_CLINIC_OR_DEPARTMENT_OTHER)
Admission: EM | Admit: 2022-07-06 | Discharge: 2022-07-06 | Disposition: A | Discharge status: Home / Self Care | Payer: Commercial Managed Care - HMO | Attending: Emergency Medicine | Admitting: Emergency Medicine

## 2022-07-06 ENCOUNTER — Emergency Department (HOSPITAL_BASED_OUTPATIENT_CLINIC_OR_DEPARTMENT_OTHER): Payer: Commercial Managed Care - HMO | Admitting: Radiology

## 2022-07-06 DIAGNOSIS — W19XXXA Unspecified fall, initial encounter: Secondary | ICD-10-CM

## 2022-07-06 DIAGNOSIS — Y93E2 Activity, laundry: Secondary | ICD-10-CM | POA: Insufficient documentation

## 2022-07-06 DIAGNOSIS — S99911A Unspecified injury of right ankle, initial encounter: Secondary | ICD-10-CM | POA: Diagnosis present

## 2022-07-06 DIAGNOSIS — W108XXA Fall (on) (from) other stairs and steps, initial encounter: Secondary | ICD-10-CM | POA: Diagnosis not present

## 2022-07-06 DIAGNOSIS — S93401A Sprain of unspecified ligament of right ankle, initial encounter: Secondary | ICD-10-CM | POA: Diagnosis not present

## 2022-07-06 DIAGNOSIS — M7989 Other specified soft tissue disorders: Secondary | ICD-10-CM | POA: Diagnosis not present

## 2022-07-06 MED ORDER — IBUPROFEN 600 MG PO TABS: 600.0000 mg | ORAL_TABLET | Freq: Four times a day (QID) | ORAL | 0 refills | Status: AC | PRN

## 2022-07-06 MED ORDER — HYDROCODONE-ACETAMINOPHEN 5-325 MG PO TABS
1.0000 | ORAL_TABLET | Freq: Once | ORAL | Status: AC
  Administered 2022-07-06: 1 via ORAL
  Filled 2022-07-06: qty 1

## 2022-07-06 MED ORDER — ONDANSETRON 4 MG PO TBDP
4.0000 mg | ORAL_TABLET | Freq: Once | ORAL | Status: AC
  Administered 2022-07-06: 4 mg via ORAL
  Filled 2022-07-06: qty 1

## 2022-07-06 NOTE — ED Triage Notes (Signed)
Pt was going down steps with laundry basket inhand, couldn't quite see and missed step and twisted right ankle, cannot put any wait on her ankle.

## 2022-07-06 NOTE — Discharge Instructions (Signed)
Thank you for letting us take care of you today.  The x-ray of your ankle and foot did not show any fractures or dislocations.  I do believe that you have an ankle sprain on the right.  Please use the walking boot provided as well as her crutches to assist with weightbearing and walking.  I recommend taking high-dose ibuprofen as prescribed over the next few days to help with any pain and inflammation.  If her symptoms do not begin improving over the next week, recommend that you follow-up with orthopedics.  You may follow-up with [facility], and orthopedic you have previously seen, or the other orthopedic doctor provided in your discharge information.  If you develop new injury or other concerns, please be reevaluated in the nearest emergency department.

## 2022-07-06 NOTE — ED Provider Notes (Signed)
[facility] Provider Note   CSN: [id number] Arrival date & time: 07/06/22  1812     History  Chief Complaint  Patient presents with   Ankle Pain    Katherine Fritz is a 42 y.o. female with past medical history anemia, depression, multiple previous right ankle sprains and a previous right ankle fracture who presents to the ED complaining of right ankle pain after falling down 1 step today.  She states she tripped causing her to fall and did not hit her head or otherwise injure herself during this fall.  She denies taking anticoagulants.  She states she has been unable to weight-bear due to the pain since the injury.  She took ibuprofen 600 mg before arrival with no relief.  Using crutches to assist with ambulation in the ED today.  States last ankle injury was at least a couple of years ago.      Home Medications Prior to Admission medications   Medication Sig Start Date End Date Taking? Authorizing Provider  ibuprofen (ADVIL) 600 MG tablet Take 1 tablet (600 mg total) by mouth every 6 (six) hours as needed for up to 5 days for mild pain or moderate pain. 07/06/22 07/11/22 Yes Martyna Thorns L, PA-C  aspirin-acetaminophen-caffeine (EXCEDRIN MIGRAINE) [phone] MG tablet Take 1 tablet by mouth every 6 (six) hours as needed for headache or migraine.    [provider]  buPROPion (WELLBUTRIN XL) 300 MG 24 hr tablet Take 300 mg by mouth daily.    [provider]  cyclobenzaprine (FLEXERIL) 10 MG tablet Take 10 mg by mouth 3 (three) times daily as needed for muscle spasms.    [provider]  diclofenac sodium (VOLTAREN) 1 % GEL Apply 4 g topically 4 (four) times daily. 01/01/19   Meccariello, Bernita Raisin, MD  meloxicam (MOBIC) 7.5 MG tablet Take 1 tablet (7.5 mg total) by mouth daily. 04/27/22   Pattricia Boss, MD  methocarbamol (ROBAXIN) 500 MG tablet Take 1 tablet (500 mg total) by mouth 2 (two) times daily. 04/27/22   Pattricia Boss, MD  morphine (MSIR) 15 MG tablet Take 1 tablet (15 mg total) by mouth every 4 (four) hours as needed for severe pain. 01/01/19   Meccariello, Bernita Raisin, MD  Multiple Vitamins-Minerals (MULTIVITAMIN ADULT PO) Take 1 tablet by mouth daily.    [provider]  mupirocin ointment (BACTROBAN) 2 % Apply 1 application topically 2 (two) times daily as needed (eczema).    [provider]  naproxen (NAPROSYN) 500 MG tablet TAKE 1 TABLET (500 MG TOTAL) BY MOUTH 2 TIMES DAILY WITH MEALS FOR 10 DAYS. 12/10/18   [provider]  nortriptyline (PAMELOR) 25 MG capsule Take 50 mg by mouth at bedtime.     [provider]  ondansetron (ZOFRAN) 8 MG tablet Take 8 mg by mouth every 8 (eight) hours as needed for nausea or vomiting.    [provider]  oxyCODONE (OXY IR/ROXICODONE) 5 MG immediate release tablet Take 5-10 mg by mouth every 6 (six) hours as needed for pain. 12/01/17   [provider]  penciclovir (DENAVIR) 1 % cream Apply 1 application topically daily as needed (cold sores).    [provider]  predniSONE (DELTASONE) 10 MG tablet  12/30/18   [provider]  SUMAtriptan (IMITREX) 100 MG tablet Take 1 tablet (100 mg total) by mouth every 2 (two) hours as needed for migraine. May repeat in 2 hours if headache persists or  recurs. 10/28/17   Marcial Pacas, MD  tamsulosin (FLOMAX) 0.4 MG CAPS capsule Take 0.4 mg by mouth as needed (kidney stone).     [provider]  tiZANidine (ZANAFLEX) 2 MG tablet  12/30/18   [provider]  triamcinolone (KENALOG) 0.025 % ointment Apply 1 application topically as needed (eczema).     [provider]  valACYclovir (VALTREX) 1000 MG tablet Take 1,000 mg by mouth as needed (cold sores).     [provider]      Allergies    Penicillins    Review of Systems   Review of Systems  All other systems reviewed and are negative.   Physical Exam Updated Vital Signs BP 100/61    Pulse 79   Temp 98.2 F (36.8 C) (Oral)   Resp 18   SpO2 95%  Physical Exam Vitals and nursing note reviewed.  Constitutional:      General: She is not in acute distress.    Appearance: Normal appearance.  HENT:     Head: Normocephalic and atraumatic.     Mouth/Throat:     Mouth: Mucous membranes are moist.  Eyes:     Conjunctiva/sclera: Conjunctivae normal.  Cardiovascular:     Rate and Rhythm: Normal rate and regular rhythm.     Heart sounds: No murmur heard. Pulmonary:     Effort: Pulmonary effort is normal.     Breath sounds: Normal breath sounds.  Abdominal:     General: Abdomen is flat.     Palpations: Abdomen is soft.     Tenderness: There is no abdominal tenderness.  Musculoskeletal:     Cervical back: Neck supple.     Right lower leg: No edema.     Left lower leg: No edema.     Comments: Moderate swelling over the right lateral malleolus with mild tenderness, mild swelling over the right medial malleolus with moderate tenderness, tenderness at the base of the right fifth toe and lateral midfoot, no wounds or overlying skin changes, ankle dorsiflexion and plantarflexion limited secondary to pain, 2+ DP PT pulse, normal sensation, no joint laxity  Skin:    General: Skin is warm and dry.     Capillary Refill: Capillary refill takes less than 2 seconds.  Neurological:     Mental Status: She is alert. Mental status is at baseline.  Psychiatric:        Behavior: Behavior normal.     ED Results / Procedures / Treatments   Labs (all labs ordered are listed, but only abnormal results are displayed) Labs Reviewed - No data to display  EKG None  Radiology DG Foot Complete Right  Result Date: 07/06/2022 CLINICAL DATA:  Midfoot tenderness EXAM: RIGHT FOOT COMPLETE - 3+ VIEW COMPARISON:  None Available. FINDINGS: There is no evidence of fracture or dislocation. There is no evidence of arthropathy or other focal bone abnormality. Soft tissues are unremarkable.  IMPRESSION: Negative. Electronically Signed   By: Ronney Asters M.D.   On: 07/06/2022 19:50   DG Ankle Complete Right  Result Date: 07/06/2022 CLINICAL DATA:  fall EXAM: RIGHT ANKLE - COMPLETE 3+ VIEW COMPARISON:  X-ray right ankle 05/15/2018 FINDINGS: There is no evidence of fracture, dislocation, or joint effusion. There is no evidence of arthropathy or other focal bone abnormality. Mild subcutaneus soft tissue edema. IMPRESSION: No acute displaced fracture or dislocation. Electronically Signed   By: Iven Finn M.D.   On: 07/06/2022 19:07    Procedures Procedures    Medications  Ordered in ED Medications  HYDROcodone-acetaminophen (NORCO/VICODIN) 5-325 MG per tablet 1 tablet (1 tablet Oral Given 07/06/22 1936)  ondansetron (ZOFRAN-ODT) disintegrating tablet 4 mg (4 mg Oral Given 07/06/22 1936)    ED Course/ Medical Decision Making/ A&P                             Medical Decision Making Amount and/or Complexity of Data Reviewed Radiology: ordered. Decision-making details documented in ED Course.  Risk Prescription drug management.   Medical Decision Making:   BRICHELLE DARRIN is a 42 y.o. female who presented to the ED today with ankle pain as detailed above.    Patient's presentation is complicated by their history of previous ankle injury.  Complete initial physical exam performed, notably the patient  was with tenderness over both malleoli, at the base of the fifth metatarsal, and to the right midfoot.   Patient was neurovascularly intact. Reviewed and confirmed nursing documentation for past medical history, family history, social history.    Initial Assessment:   With the patient's presentation of ankle pain, most likely diagnosis is ankle sprain. Other diagnoses were considered including (but not limited to) fracture, dislocation, compartment syndrome, unstable joint. These are considered less likely due to history of present illness and physical exam findings.   This  is most consistent with an acute complicated illness  Initial Plan:  XR to evaluate for structural/infectious pathology.  Objective evaluation as reviewed  Pain medication for pain management while waiting for results  Initial Study Results:    Radiology:  All images reviewed independently. Agree with radiology report at this time.   DG Foot Complete Right  Result Date: 07/06/2022 CLINICAL DATA:  Midfoot tenderness EXAM: RIGHT FOOT COMPLETE - 3+ VIEW COMPARISON:  None Available. FINDINGS: There is no evidence of fracture or dislocation. There is no evidence of arthropathy or other focal bone abnormality. Soft tissues are unremarkable. IMPRESSION: Negative. Electronically Signed   By: Ronney Asters M.D.   On: 07/06/2022 19:50   DG Ankle Complete Right  Result Date: 07/06/2022 CLINICAL DATA:  fall EXAM: RIGHT ANKLE - COMPLETE 3+ VIEW COMPARISON:  X-ray right ankle 05/15/2018 FINDINGS: There is no evidence of fracture, dislocation, or joint effusion. There is no evidence of arthropathy or other focal bone abnormality. Mild subcutaneus soft tissue edema. IMPRESSION: No acute displaced fracture or dislocation. Electronically Signed   By: Iven Finn M.D.   On: 07/06/2022 19:07     Final Assessment and Plan:   This is a 42 year old female presenting to the ED for right ankle pain post mechanical fall.  She is neurovascularly intact but does have tenderness and swelling over the ankle as above with inability to weight-bear.  Dose of pain medication given in the ED while she awaits results.  Following [place] ankle and foot rules, ordered x-ray of both the ankle and foot.  These are negative for acute fracture, dislocation, or other bony injury.  With inability to weight-bear and previous injuries, I do suspect a grade 2 or grade 3 ankle sprain.  There is no joint laxity.  Will place in cam boot, patient will continue to use her crutches to assist with ambulation, NSAIDs at home for pain, and patient  instructed to follow-up with orthopedics in the office later this week.  Strict ED return precautions given, all questions answered, patient stable for discharge.   Clinical Impression:  1. Sprain of right ankle, unspecified ligament, initial  encounter   2. Fall, initial encounter      Discharge           Final Clinical Impression(s) / ED Diagnoses Final diagnoses:  Sprain of right ankle, unspecified ligament, initial encounter  Fall, initial encounter    Rx / DC Orders ED Discharge Orders          Ordered    ibuprofen (ADVIL) 600 MG tablet  Every 6 hours PRN        07/06/22 2015              Suzzette Righter, PA-C 07/06/22 2021    Leanord Asal K, DO 07/07/22 [id number]

## 2023-09-15 ENCOUNTER — Other Ambulatory Visit: Payer: Self-pay | Admitting: Urology

## 2023-09-15 ENCOUNTER — Encounter: Payer: Self-pay | Admitting: Urology

## 2023-09-15 DIAGNOSIS — N281 Cyst of kidney, acquired: Secondary | ICD-10-CM

## 2023-10-28 ENCOUNTER — Ambulatory Visit
Admission: RE | Admit: 2023-10-28 | Discharge: 2023-10-28 | Disposition: A | Discharge status: Home / Self Care | Source: Ambulatory Visit | Attending: Urology | Admitting: Urology

## 2023-10-28 DIAGNOSIS — N281 Cyst of kidney, acquired: Secondary | ICD-10-CM

## 2023-10-28 MED ORDER — GADOPICLENOL 0.5 MMOL/ML IV SOLN
7.0000 mL | Freq: Once | INTRAVENOUS | Status: AC | PRN
  Administered 2023-10-28: 7 mL via INTRAVENOUS

## 2023-11-26 ENCOUNTER — Other Ambulatory Visit: Payer: Self-pay | Admitting: Urology

## 2023-12-01 NOTE — Patient Instructions (Signed)
 SURGICAL WAITING ROOM VISITATION Patients having surgery or a procedure may have no more than 2 support people in the waiting area - these visitors may rotate in the visitor waiting room.   Due to an increase in RSV and influenza rates and associated hospitalizations, children ages 83 and under may not visit patients in [facility] hospitals. If the patient needs to stay at the hospital during part of their recovery, the visitor guidelines for inpatient rooms apply.  PRE-OP VISITATION  Pre-op nurse will coordinate an appropriate time for 1 support person to accompany the patient in pre-op.  This support person may not rotate.  This visitor will be contacted when the time is appropriate for the visitor to come back in the pre-op area.  Please refer to the [facility] website for the visitor guidelines for Inpatients (after your surgery is over and you are in a regular room).  You are not required to quarantine at this time prior to your surgery. However, you must do this: Hand Hygiene often Do NOT share personal items Notify your provider if you are in close contact with someone who has COVID or you develop fever 100.4 or greater, new onset of sneezing, cough, sore throat, shortness of breath or body aches.  If you test positive for Covid or have been in contact with anyone that has tested positive in the last 10 days please notify you surgeon.    Your procedure is scheduled on: 12/09/23  Report to [facility] Main Entrance: [place] entrance where the [employer] is available.   Report to admitting at:1:15 PM  Call this number if you have any questions or problems the morning of surgery [phone]  FOLLOW ANY ADDITIONAL PRE OP INSTRUCTIONS YOU RECEIVED FROM YOUR SURGEON'S OFFICE!!!  Do not eat food after Midnight the night prior to your surgery/procedure.  After Midnight you may have the following liquids until: 12:30 PM DAY OF SURGERY  Clear Liquid Diet Water Black  Coffee (sugar ok, NO MILK/CREAM OR CREAMERS)  Tea (sugar ok, NO MILK/CREAM OR CREAMERS) regular and decaf                             Plain Jell-O  with no fruit (NO RED)                                           Fruit ices (not with fruit pulp, NO RED)                                     Popsicles (NO RED)                                                                  Juice: NO CITRUS JUICES: only apple, WHITE grape, WHITE cranberry Sports drinks like Gatorade or Powerade (NO RED)             Oral Hygiene is also important to reduce your risk of infection.        Remember - BRUSH YOUR TEETH  THE MORNING OF SURGERY WITH YOUR REGULAR TOOTHPASTE  Do NOT smoke after Midnight the night before surgery.  STOP TAKING all Vitamins, Herbs and supplements 1 week before your surgery.   Take ONLY these medicines the morning of surgery with A SIP OF WATER: Cariprazine.                 You may not have any metal on your body including hair pins, jewelry, and body piercing  Do not wear make-up, lotions, powders, perfumes / cologne, or deodorant  Do not wear nail polish including gel and S&S, artificial / acrylic nails, or any other type of covering on natural nails including finger and toenails. If you have artificial nails, gel coating, etc., that needs to be removed by a nail salon, Please have this removed prior to surgery. Not doing so may mean that your surgery could be cancelled or delayed if the Surgeon or anesthesia staff feels like they are unable to monitor you safely.   Do not shave 48 hours prior to surgery to avoid nicks in your skin which may contribute to postoperative infections.   Contacts, Hearing Aids, dentures or bridgework may not be worn into surgery. DENTURES WILL BE REMOVED PRIOR TO SURGERY PLEASE DO NOT APPLY Poly grip OR ADHESIVES!!!  You may bring a small overnight bag with you on the day of surgery, only pack items that are not valuable. [place] IS NOT RESPONSIBLE   FOR  VALUABLES THAT ARE LOST OR STOLEN.   Patients discharged on the day of surgery will not be allowed to drive home.  Someone NEEDS to stay with you for the first 24 hours after anesthesia.  Do not bring your home medications to the hospital. The Pharmacy will dispense medications listed on your medication list to you during your admission in the Hospital.  Special Instructions: Bring a copy of your healthcare power of attorney and living will documents the day of surgery, if you wish to have them scanned into your [facility] Records- EPIC  Please read over the following fact sheets you were given: IF YOU HAVE QUESTIONS ABOUT YOUR PRE-OP INSTRUCTIONS, PLEASE CALL [phone]   [facility] - Preparing for Surgery Before surgery, you can play an important role.  Because skin is not sterile, your skin needs to be as free of germs as possible.  You can reduce the number of germs on your skin by washing with CHG (chlorahexidine gluconate) soap before surgery.  CHG is an antiseptic cleaner which kills germs and bonds with the skin to continue killing germs even after washing. Please DO NOT use if you have an allergy to CHG or antibacterial soaps.  If your skin becomes reddened/irritated stop using the CHG and inform your nurse when you arrive at Short Stay. Do not shave (including legs and underarms) for at least 48 hours prior to the first CHG shower.  You may shave your face/neck.  Please follow these instructions carefully:  1.  Shower with CHG Soap the night before surgery and the  morning of surgery.  2.  If you choose to wash your hair, wash your hair first as usual with your normal  shampoo.  3.  After you shampoo, rinse your hair and body thoroughly to remove the shampoo.                             4.  Use CHG as you would any other  liquid soap.  You can apply chg directly to the skin and wash.  Gently with a scrungie or clean washcloth.  5.  Apply the CHG Soap to your body ONLY FROM  THE NECK DOWN.   Do not use on face/ open                           Wound or open sores. Avoid contact with eyes, ears mouth and genitals (private parts).                       Wash face,  Genitals (private parts) with your normal soap.             6.  Wash thoroughly, paying special attention to the area where your  surgery  will be performed.  7.  Thoroughly rinse your body with warm water from the neck down.  8.  DO NOT shower/wash with your normal soap after using and rinsing off the CHG Soap.            9.  Pat yourself dry with a clean towel.            10.  Wear clean pajamas.            11.  Place clean sheets on your bed the night of your first shower and do not  sleep with pets.  ON THE DAY OF SURGERY : Do not apply any lotions/deodorants the morning of surgery.  Please wear clean clothes to the hospital/surgery center.     FAILURE TO FOLLOW THESE INSTRUCTIONS MAY RESULT IN THE CANCELLATION OF YOUR SURGERY  PATIENT SIGNATURE_________________________________  NURSE SIGNATURE__________________________________  ________________________________________________________________________

## 2023-12-02 ENCOUNTER — Encounter (HOSPITAL_COMMUNITY)
Admission: RE | Admit: 2023-12-02 | Discharge: 2023-12-02 | Disposition: A | Discharge status: Home / Self Care | Source: Ambulatory Visit | Attending: Urology | Admitting: Urology

## 2023-12-02 ENCOUNTER — Encounter (HOSPITAL_COMMUNITY): Payer: Self-pay

## 2023-12-02 ENCOUNTER — Other Ambulatory Visit: Payer: Self-pay

## 2023-12-02 VITALS — BP 115/85 | HR 98 | Temp 98.6°F | Ht 64.0 in | Wt 163.0 lb

## 2023-12-02 DIAGNOSIS — Z01812 Encounter for preprocedural laboratory examination: Secondary | ICD-10-CM | POA: Diagnosis present

## 2023-12-02 DIAGNOSIS — Z01818 Encounter for other preprocedural examination: Secondary | ICD-10-CM

## 2023-12-02 LAB — CBC
HCT: 38.7 % (ref 36.0–46.0)
Hemoglobin: 12.6 g/dL (ref 12.0–15.0)
MCH: 28.7 pg (ref 26.0–34.0)
MCHC: 32.6 g/dL (ref 30.0–36.0)
MCV: 88.2 fL (ref 80.0–100.0)
Platelets: 334 K/uL (ref 150–400)
RBC: 4.39 MIL/uL (ref 3.87–5.11)
RDW: 13 % (ref 11.5–15.5)
WBC: 8 K/uL (ref 4.0–10.5)
nRBC: 0 % (ref 0.0–0.2)

## 2023-12-02 LAB — BASIC METABOLIC PANEL WITH GFR
Anion gap: 8 (ref 5–15)
BUN: 10 mg/dL (ref 6–20)
CO2: 27 mmol/L (ref 22–32)
Calcium: 9 mg/dL (ref 8.9–10.3)
Chloride: 102 mmol/L (ref 98–111)
Creatinine, Ser: 0.46 mg/dL (ref 0.44–1.00)
GFR, Estimated: >60 mL/min (ref >60)
Glucose, Bld: 102 mg/dL — ABNORMAL HIGH (ref 70–99)
Potassium: 3.5 mmol/L (ref 3.5–5.1)
Sodium: 137 mmol/L (ref 135–145)

## 2023-12-02 LAB — SURGICAL PCR SCREEN
MRSA, PCR: NEGATIVE
Staphylococcus aureus: NEGATIVE

## 2023-12-02 NOTE — Progress Notes (Signed)
 For Anesthesia: PCP - Burney Darice CROME, MD  Cardiologist - N/A  Bowel Prep reminder:  Chest x-ray -  EKG -  Stress Test -  ECHO -  Cardiac Cath -  Pacemaker/ICD device last checked: Pacemaker orders received: Device Rep notified:  Spinal Cord Stimulator:N/A  Sleep Study - N/A CPAP -   Fasting Blood Sugar - N/A Checks Blood Sugar _____ times a day Date and result of last Hgb A1c-  Last dose of GLP1 agonist- semaglutide GLP1 instructions: To hold it after: 12/01/23  Last dose of SGLT-2 inhibitors- N/A SGLT-2 instructions:   Blood Thinner Instructions:N/A Aspirin Instructions: Last Dose:  Activity level: Can go up a flight of stairs and activities of daily living without stopping and without chest pain and/or shortness of breath   Able to exercise without chest pain and/or shortness of breath  Anesthesia review:   Patient denies shortness of breath, fever, cough and chest pain at PAT appointment   Patient verbalized understanding of instructions that were reviewed over the telephone.

## 2023-12-08 NOTE — Anesthesia Preprocedure Evaluation (Signed)
 Anesthesia Evaluation  Patient identified by MRN, date of birth, ID band Patient awake    Reviewed: Allergy & Precautions, NPO status , Patient's Chart, lab work & pertinent test results  Airway Mallampati: II  TM Distance: >3 FB Neck ROM: Full    Dental no notable dental hx. (+) Partial Upper, Lower Dentures   Pulmonary former smoker   Pulmonary exam normal breath sounds clear to auscultation       Cardiovascular (-) hypertension(-) angina (-) Past MI Normal cardiovascular exam Rhythm:Regular Rate:Normal     Neuro/Psych  Headaches PSYCHIATRIC DISORDERS  Depression       GI/Hepatic negative GI ROS, Neg liver ROS,,,  Endo/Other    Renal/GU negative Renal ROS     Musculoskeletal   Abdominal   Peds  Hematology Lab Results      Component                Value               Date                      WBC                      8.0                 12/02/2023                HGB                      12.6                12/02/2023                HCT                      38.7                12/02/2023                MCV                      88.2                12/02/2023                PLT                      334                 12/02/2023              Anesthesia Other Findings All: PCN  Reproductive/Obstetrics negative OB ROS                              Anesthesia Physical Anesthesia Plan  ASA: 2  Anesthesia Plan: General   Post-op Pain Management: Ofirmev  IV (intra-op)* and Precedex   Induction: Intravenous  PONV Risk Score and Plan: Treatment may vary due to age or medical condition and Midazolam   Airway Management Planned: LMA  Additional Equipment: None  Intra-op Plan:   Post-operative Plan: Extubation in OR  Informed Consent: I have reviewed the patients History and Physical, chart, labs and discussed the procedure including the risks, benefits and alternatives for the proposed  anesthesia with the patient or authorized representative  who has indicated his/her understanding and acceptance.     Dental advisory given  Plan Discussed with: CRNA and Surgeon  Anesthesia Plan Comments:          Anesthesia Quick Evaluation

## 2023-12-09 ENCOUNTER — Ambulatory Visit (HOSPITAL_BASED_OUTPATIENT_CLINIC_OR_DEPARTMENT_OTHER): Payer: Self-pay | Admitting: Anesthesiology

## 2023-12-09 ENCOUNTER — Encounter (HOSPITAL_COMMUNITY)
Admission: RE | Disposition: A | Discharge status: Home / Self Care | Payer: Self-pay | Source: Home / Self Care | Attending: Urology

## 2023-12-09 ENCOUNTER — Encounter (HOSPITAL_COMMUNITY): Payer: Self-pay | Admitting: Urology

## 2023-12-09 ENCOUNTER — Ambulatory Visit (HOSPITAL_COMMUNITY)

## 2023-12-09 ENCOUNTER — Ambulatory Visit (HOSPITAL_COMMUNITY)
Admission: RE | Admit: 2023-12-09 | Discharge: 2023-12-09 | Disposition: A | Discharge status: Home / Self Care | Attending: Urology | Admitting: Urology

## 2023-12-09 ENCOUNTER — Ambulatory Visit (HOSPITAL_COMMUNITY): Payer: Self-pay | Admitting: Medical

## 2023-12-09 ENCOUNTER — Other Ambulatory Visit: Payer: Self-pay

## 2023-12-09 DIAGNOSIS — Z01818 Encounter for other preprocedural examination: Secondary | ICD-10-CM

## 2023-12-09 DIAGNOSIS — E669 Obesity, unspecified: Secondary | ICD-10-CM | POA: Diagnosis not present

## 2023-12-09 DIAGNOSIS — R310 Gross hematuria: Secondary | ICD-10-CM | POA: Insufficient documentation

## 2023-12-09 DIAGNOSIS — N200 Calculus of kidney: Secondary | ICD-10-CM

## 2023-12-09 DIAGNOSIS — N281 Cyst of kidney, acquired: Secondary | ICD-10-CM | POA: Insufficient documentation

## 2023-12-09 HISTORY — PX: CYSTOSCOPY/URETEROSCOPY/HOLMIUM LASER/STENT PLACEMENT: SHX6546

## 2023-12-09 HISTORY — PX: CYSTOSCOPY W/ RETROGRADES: SHX1426

## 2023-12-09 LAB — POCT PREGNANCY, URINE: Preg Test, Ur: NEGATIVE

## 2023-12-09 SURGERY — CYSTOSCOPY/URETEROSCOPY/HOLMIUM LASER/STENT PLACEMENT
Anesthesia: General | Site: Pelvis | Laterality: Bilateral

## 2023-12-09 MED ORDER — SODIUM CHLORIDE 0.9 % IR SOLN
Status: DC | PRN
  Administered 2023-12-09: 3000 mL via INTRAVESICAL

## 2023-12-09 MED ORDER — ACETAMINOPHEN 500 MG PO TABS
ORAL_TABLET | ORAL | Status: AC
  Filled 2023-12-09: qty 2

## 2023-12-09 MED ORDER — SULFAMETHOXAZOLE-TRIMETHOPRIM 800-160 MG PO TABS: 1.0000 | ORAL_TABLET | Freq: Every day | ORAL | 0 refills | Status: AC

## 2023-12-09 MED ORDER — FENTANYL CITRATE (PF) 100 MCG/2ML IJ SOLN
INTRAMUSCULAR | Status: DC | PRN
  Administered 2023-12-09 (×2): 50 ug via INTRAVENOUS

## 2023-12-09 MED ORDER — ONDANSETRON HCL 4 MG/2ML IJ SOLN: 4.0000 mg | Freq: Once | INTRAMUSCULAR | Status: DC | PRN

## 2023-12-09 MED ORDER — ACETAMINOPHEN 10 MG/ML IV SOLN
INTRAVENOUS | Status: AC
  Filled 2023-12-09: qty 100

## 2023-12-09 MED ORDER — OXYCODONE HCL 5 MG PO TABS
5.0000 mg | ORAL_TABLET | Freq: Once | ORAL | Status: AC | PRN
  Administered 2023-12-09: 5 mg via ORAL

## 2023-12-09 MED ORDER — HYDROMORPHONE HCL 1 MG/ML IJ SOLN
INTRAMUSCULAR | Status: AC
  Filled 2023-12-09: qty 1

## 2023-12-09 MED ORDER — IOHEXOL 300 MG/ML  SOLN
INTRAMUSCULAR | Status: DC | PRN
  Administered 2023-12-09: 34 mL

## 2023-12-09 MED ORDER — OXYCODONE HCL 5 MG/5ML PO SOLN: 5.0000 mg | Freq: Once | ORAL | Status: AC | PRN

## 2023-12-09 MED ORDER — ONDANSETRON HCL 4 MG/2ML IJ SOLN
INTRAMUSCULAR | Status: AC
Start: 2023-12-09 — End: 2023-12-09
  Filled 2023-12-09: qty 2

## 2023-12-09 MED ORDER — KETOROLAC TROMETHAMINE 10 MG PO TABS: 10.0000 mg | ORAL_TABLET | Freq: Three times a day (TID) | ORAL | 0 refills | Status: AC | PRN

## 2023-12-09 MED ORDER — DEXAMETHASONE SODIUM PHOSPHATE 10 MG/ML IJ SOLN
INTRAMUSCULAR | Status: AC
Start: 2023-12-09 — End: 2023-12-09
  Filled 2023-12-09: qty 1

## 2023-12-09 MED ORDER — ONDANSETRON HCL 4 MG/2ML IJ SOLN
INTRAMUSCULAR | Status: DC | PRN
  Administered 2023-12-09: 4 mg via INTRAVENOUS

## 2023-12-09 MED ORDER — LACTATED RINGERS IV SOLN: INTRAVENOUS | Status: DC | PRN

## 2023-12-09 MED ORDER — PROPOFOL 10 MG/ML IV BOLUS
INTRAVENOUS | Status: DC | PRN
  Administered 2023-12-09: 180 mg via INTRAVENOUS

## 2023-12-09 MED ORDER — LACTATED RINGERS IV SOLN: INTRAVENOUS | Status: DC

## 2023-12-09 MED ORDER — MIDAZOLAM HCL 2 MG/2ML IJ SOLN
INTRAMUSCULAR | Status: DC | PRN
  Administered 2023-12-09: 2 mg via INTRAVENOUS

## 2023-12-09 MED ORDER — OXYCODONE HCL 5 MG PO TABS
ORAL_TABLET | ORAL | Status: AC
  Filled 2023-12-09: qty 1

## 2023-12-09 MED ORDER — HYDROMORPHONE HCL 1 MG/ML IJ SOLN
0.2500 mg | INTRAMUSCULAR | Status: DC | PRN
  Administered 2023-12-09: 0.5 mg via INTRAVENOUS

## 2023-12-09 MED ORDER — AMISULPRIDE (ANTIEMETIC) 5 MG/2ML IV SOLN: 10.0000 mg | Freq: Once | INTRAVENOUS | Status: DC | PRN

## 2023-12-09 MED ORDER — MIDAZOLAM HCL 2 MG/2ML IJ SOLN
INTRAMUSCULAR | Status: AC
  Filled 2023-12-09: qty 2

## 2023-12-09 MED ORDER — ORAL CARE MOUTH RINSE: 15.0000 mL | Freq: Once | OROMUCOSAL | Status: AC

## 2023-12-09 MED ORDER — SENNOSIDES-DOCUSATE SODIUM 8.6-50 MG PO TABS: 1.0000 | ORAL_TABLET | Freq: Two times a day (BID) | ORAL | 0 refills | Status: AC

## 2023-12-09 MED ORDER — ACETAMINOPHEN 10 MG/ML IV SOLN
1000.0000 mg | Freq: Once | INTRAVENOUS | Status: DC | PRN
  Administered 2023-12-09: 1000 mg via INTRAVENOUS

## 2023-12-09 MED ORDER — GENTAMICIN SULFATE 40 MG/ML IJ SOLN
5.0000 mg/kg | INTRAVENOUS | Status: AC
  Administered 2023-12-09: 310 mg via INTRAVENOUS
  Filled 2023-12-09: qty 7.75

## 2023-12-09 MED ORDER — CHLORHEXIDINE GLUCONATE 0.12 % MT SOLN
15.0000 mL | Freq: Once | OROMUCOSAL | Status: AC
  Administered 2023-12-09: 15 mL via OROMUCOSAL

## 2023-12-09 MED ORDER — DEXAMETHASONE SODIUM PHOSPHATE 10 MG/ML IJ SOLN
INTRAMUSCULAR | Status: DC | PRN
  Administered 2023-12-09: 5 mg via INTRAVENOUS

## 2023-12-09 MED ORDER — OXYCODONE-ACETAMINOPHEN 5-325 MG PO TABS: 1.0000 | ORAL_TABLET | Freq: Four times a day (QID) | ORAL | 0 refills | Status: AC | PRN

## 2023-12-09 MED ORDER — FENTANYL CITRATE (PF) 100 MCG/2ML IJ SOLN
INTRAMUSCULAR | Status: AC
Start: 2023-12-09 — End: 2023-12-09
  Filled 2023-12-09: qty 2

## 2023-12-09 SURGICAL SUPPLY — 19 items
BAG URO CATCHER STRL LF (MISCELLANEOUS) ×1 IMPLANT
BASKET LASER NITINOL 1.9FR (BASKET) IMPLANT
CATH URETL OPEN END 6FR 70 (CATHETERS) ×1 IMPLANT
CLOTH BEACON ORANGE TIMEOUT ST (SAFETY) ×1 IMPLANT
GLOVE SURG LX STRL 7.5 STRW (GLOVE) ×1 IMPLANT
GOWN STRL REUS W/ TWL XL LVL3 (GOWN DISPOSABLE) ×1 IMPLANT
GUIDEWIRE ANG ZIPWIRE 038X150 (WIRE) ×1 IMPLANT
GUIDEWIRE STR DUAL SENSOR (WIRE) ×1 IMPLANT
KIT TURNOVER KIT A (KITS) ×1 IMPLANT
MANIFOLD NEPTUNE II (INSTRUMENTS) ×1 IMPLANT
NS IRRIG 1000ML POUR BTL (IV SOLUTION) IMPLANT
PACK CYSTO (CUSTOM PROCEDURE TRAY) ×1 IMPLANT
SHEATH NAVIGATOR HD 11/13X28 (SHEATH) IMPLANT
SHEATH NAVIGATOR HD 11/13X36 (SHEATH) IMPLANT
STENT POLARIS 5FRX22 (STENTS) IMPLANT
TRACTIP FLEXIVA PULS ID 200XHI (Laser) IMPLANT
TUBE PU 8FR 16IN ENFIT (TUBING) ×1 IMPLANT
TUBING CONNECTING 10 (TUBING) ×1 IMPLANT
TUBING UROLOGY SET (TUBING) ×1 IMPLANT

## 2023-12-09 NOTE — Brief Op Note (Signed)
 12/09/2023  4:16 PM  PATIENT:  Katherine Fritz  43 y.o. female  PRE-OPERATIVE DIAGNOSIS:  BILATERAL STONES, HEMATURIA  POST-OPERATIVE DIAGNOSIS:  BILATERAL STONES, HEMATURIA  PROCEDURE:  Procedure(s): CYSTOSCOPY/URETEROSCOPY/HOLMIUM LASER/STENT PLACEMENT (Bilateral) CYSTOSCOPY, WITH RETROGRADE PYELOGRAM (Bilateral)  SURGEON:  Surgeons and Role:    * Manny, Ricardo KATHEE Raddle., MD - Primary  PHYSICIAN ASSISTANT:   ASSISTANTS: none   ANESTHESIA:   general  EBL:  minimal   BLOOD ADMINISTERED:none  DRAINS: none   LOCAL MEDICATIONS USED:  NONE  SPECIMEN:  Source of Specimen:  bilateral renal stones  DISPOSITION OF SPECIMEN:  [facility] for compositional analysis  COUNTS:  YES  TOURNIQUET:  * No tourniquets in log *  DICTATION: .Other Dictation: Dictation Number [id number]  PLAN OF CARE: Discharge to home after PACU  PATIENT DISPOSITION:  PACU - hemodynamically stable.   Delay start of Pharmacological VTE agent (>24hrs) due to surgical blood loss or risk of bleeding: not applicable

## 2023-12-09 NOTE — Discharge Instructions (Addendum)
 1 - You may have urinary urgency (bladder spasms) and bloody urine on / off with stent in place. This is normal.  2 - Remove tethered stent on Friday morning at home by pulling on strings, then blue-white plastic tubing, and discarding. Office is open Friday if any issues arise.   3 -Call MD or go to ER for fever >102, severe pain / nausea / vomiting not relieved by medications, or acute change in medical status

## 2023-12-09 NOTE — Anesthesia Postprocedure Evaluation (Signed)
 Anesthesia Post Note  Patient: Katherine Fritz  Procedure(s) Performed: CYSTOSCOPY/URETEROSCOPY/HOLMIUM LASER/STENT PLACEMENT (Bilateral: Pelvis) CYSTOSCOPY, WITH RETROGRADE PYELOGRAM (Bilateral: Pelvis)     Patient location during evaluation: PACU Anesthesia Type: General Level of consciousness: awake and alert Pain management: pain level controlled Vital Signs Assessment: post-procedure vital signs reviewed and stable Respiratory status: spontaneous breathing, nonlabored ventilation and respiratory function stable Cardiovascular status: blood pressure returned to baseline and stable Postop Assessment: no apparent nausea or vomiting Anesthetic complications: no   No notable events documented.  Last Vitals:  Vitals:   12/09/23 1659 12/09/23 1800  BP: (!) 145/92 (!) 140/72  Pulse: 74 (!) 45  Resp: 12 14  Temp: 36.6 C   SpO2: 98% 98%    Last Pain:  Vitals:   12/09/23 1739  TempSrc:   PainSc: 9                  Butler Levander Pinal

## 2023-12-09 NOTE — Transfer of Care (Signed)
 Immediate Anesthesia Transfer of Care Note  Patient: Katherine Fritz  Procedure(s) Performed: CYSTOSCOPY/URETEROSCOPY/HOLMIUM LASER/STENT PLACEMENT (Bilateral: Pelvis) CYSTOSCOPY, WITH RETROGRADE PYELOGRAM (Bilateral: Pelvis)  Patient Location: PACU  Anesthesia Type:General  Level of Consciousness: awake and alert   Airway & Oxygen Therapy: Patient Spontanous Breathing and Patient connected to nasal cannula oxygen  Post-op Assessment: Report given to RN and Post -op Vital signs reviewed and stable  Post vital signs: Reviewed and stable  Last Vitals:  Vitals Value Taken Time  BP 125/81 12/09/23 16:31  Temp    Pulse 71 12/09/23 16:33  Resp 16 12/09/23 16:33  SpO2 99 % 12/09/23 16:33  Vitals shown include unfiled device data.  Last Pain:  Vitals:   12/09/23 1329  TempSrc: Oral         Complications: No notable events documented.

## 2023-12-09 NOTE — Anesthesia Procedure Notes (Signed)
 Procedure Name: LMA Insertion Date/Time: 12/09/2023 3:31 PM  Performed by: Dartha Meckel, CRNAPre-anesthesia Checklist: Patient identified, Emergency Drugs available, Suction available and Patient being monitored Patient Re-evaluated:Patient Re-evaluated prior to induction Oxygen Delivery Method: Circle system utilized Preoxygenation: Pre-oxygenation with 100% oxygen Induction Type: IV induction Ventilation: Mask ventilation without difficulty LMA: LMA inserted LMA Size: 4.0 Tube type: Oral Number of attempts: 1 Airway Equipment and Method: Stylet and Oral airway Placement Confirmation: positive ETCO2 and breath sounds checked- equal and bilateral Tube secured with: Tape Dental Injury: Teeth and Oropharynx as per pre-operative assessment

## 2023-12-09 NOTE — H&P (Signed)
 Katherine Fritz is an 43 y.o. female.    Chief Complaint: Pre-OP BILATERAL Ureteroscopic Stone Manipulation  HPI:   1 - Gross Hematuria - Eval 2025 with CT, Cysto only with smal rneal stones as per below   2 - Urolithiasis - small bilateral non-obstructing stones on hematuria CT 2025. ? prior colic, but no stone passage episodes or surgery. Abtou 4mm x 2-3 each side, no hydro.   3 - NON-Complex Renal Cysts - R>L <2cm non-complex cysts w/o mass effect on CT then dedicated MRI 2025   PMH sig for mild obesity (improved on GLP-1), cyclothmic disorder (well controlled), lap chole. No ischemic CV disease / blood thinners. 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 Her PCP is [name] MD.   Today  Katherine Fritz  is seen to proceed with BILATERAL ureteroscopy for bialteral renal stones in setting of recurrent hematuria. Most recent UCX negative, Cr 0.4, Hgb 12.   Past Medical History:  Diagnosis Date   Abnormal Pap smear    Colposcopy normal   Anemia    Depression    Not on current treatment   Headache    History of kidney stones    MRSA infection    Ovarian cyst    Vaginal Pap smear, abnormal     Past Surgical History:  Procedure Laterality Date   CHOLECYSTECTOMY     COLPOSCOPY     DENTAL SURGERY     LAPAROSCOPIC BILATERAL SALPINGECTOMY Bilateral 03/19/2016   Procedure: LAPAROSCOPIC BILATERAL SALPINGECTOMY;  Surgeon: Hargis Paradise, MD;  Location: [facility];  Service: Gynecology;  Laterality: Bilateral;    Family History  Problem Relation Age of Onset   COPD Mother    Hypertension Mother    Heart disease Mother    Lung cancer Mother    Hyperlipidemia Sister    Hypertension Sister    Depression Brother    Drug abuse Brother    Other Father        unsure of birth father's history   Multiple myeloma Maternal Aunt    Cancer Maternal Uncle    Social History:  reports that she quit smoking about 15 years ago. Her smoking use included cigarettes. She has never used smokeless tobacco. She  reports current alcohol use. She reports that she does not currently use drugs after having used the following drugs: Marijuana.  Allergies:  Allergies  Allergen Reactions   Penicillins Rash and Other (See Comments)    Has patient had a PCN reaction causing immediate rash, facial/tongue/throat swelling, SOB or lightheadedness with hypotension: No Has patient had a PCN reaction causing severe rash involving mucus membranes or skin necrosis: No Has patient had a PCN reaction that required hospitalization No Has patient had a PCN reaction occurring within the last 10 years: No If all of the above answers are NO, then may proceed with Cephalosporin use.    No medications prior to admission.    No results found for this or any previous visit (from the past 48 hours). No results found.  Review of Systems  Constitutional:  Negative for chills and fever.  Genitourinary:  Positive for hematuria.  All other systems reviewed and are negative.   Last menstrual period 11/11/2023. Physical Exam Vitals reviewed.  HENT:     Head: Normocephalic.  Eyes:     Pupils: Pupils are equal, round, and reactive to light.  Cardiovascular:     Rate and Rhythm: Normal rate.  Pulmonary:     Effort: Pulmonary effort is normal.  Abdominal:  General: Abdomen is flat.  Genitourinary:    Comments: NO CVAT at present Musculoskeletal:        General: Normal range of motion.     Cervical back: Normal range of motion.  Skin:    General: Skin is warm.  Neurological:     Mental Status: She is alert.  Psychiatric:        Mood and Affect: Mood normal.      Assessment/Plan  Proceed as planned with BILATERAL ureteroscopy with goal of stone free. Risks, benefits, alternatives, expected peri-op course discussed previously and reiterated today. She understands that we will  use peri-op stents given bilateral nature.   Ricardo KATHEE Alvaro Mickey., MD 12/09/2023, 7:19 AM

## 2023-12-10 ENCOUNTER — Encounter (HOSPITAL_COMMUNITY): Payer: Self-pay | Admitting: Urology

## 2023-12-14 NOTE — Op Note (Signed)
 Katherine Fritz, [place] MEDICAL RECORD NO: [id number] ACCOUNT NO: [id number] DATE OF BIRTH: 1980-11-11 FACILITY: [facility] LOCATION: [facility] PHYSICIAN: Ricardo Likens, MD  Operative Report   DATE OF PROCEDURE: 12/09/2023  PREOPERATIVE DIAGNOSES: 1. Bilateral renal stones. 2. Recurrent gross hematuria.  POSTOPERATIVE DIAGNOSES: 1. Bilateral renal stones. 2. Recurrent gross hematuria.  PROCEDURE PERFORMED: 1.  Cystoscopy with bilateral retrograde pyelograms, interpretation. 2.  Bilateral ureteroscopy with laser lithotripsy. 3.  Insertion of bilateral ureteral stents.  ESTIMATED BLOOD LOSS:  Nil.  COMPLICATIONS:  None.  SPECIMENS:  Bilateral renal stone fragments for composition analysis.  FINDINGS: 1.  Bilateral papillary tip calcifications approximately 8 mm squared each side. 2.  Complete resolution of all accessible stone fragments larger than one-third mm following laser lithotripsy and basket extraction bilaterally. 3.  Successful placement of bilateral ureteral stents, proximal end in renal pelvis, distal end in urinary bladder with tether.  INDICATIONS: The patient is a very pleasant 43 year old lady who was found on workup of recurrent gross hematuria and occasional flank pain to have bilateral renal stones.  There is no hydronephrosis noted; however, symptoms are consistent with suspect  moving stones and intermittent obstruction. She has no other obvious risk for hematuria.  Options were discussed including medical therapy, observation versus ureteroscopy with the goal of stone-free and she wished to proceed with the latter. She also  does have frequent travel and does not wish to have renal colic when out of town. Informed consent was obtained and placed in the medical record.  DESCRIPTION OF PROCEDURE: The patient being herself verified, procedure being bilateral ureteroscopic stone manipulation was confirmed. Procedure timeout was performed and intravenous antibiotics  administered. General LMA anesthesia induced. The patient  was placed into a low lithotomy position. Sterile field was created, prepped and draped the patient's vagina, introitus and proximal thighs using iodine. Cystourethroscopy was performed using a 21-French rigid cystoscope with offset lens. Inspection of  the urinary bladder revealed no diverticula or calcifications or papillary lesions. Ureteral orifices were single. The right ureteral orifice was cannulated with a 6-French open-ended catheter, and right retrograde pyelogram was obtained.  Right retrograde pyelogram demonstrated a single right ureter, single system right kidney. No filling defects or narrowing noted.  A 0.038 ZIPwire was advanced to level of lower pole, set aside as a safety wire.  Next, left retrograde pyelogram was  obtained.  Left retrograde pyelogram demonstrated a single left ureter single system left kidney.  No filling defects or narrowing noted.  A separate ZIPwire was advanced to level of lower pole, set aside as a safety wire.  An 8-French feeding tube was placed in  the urinary bladder for pressure release.  Next, semirigid ureteroscopy was performed of the distal four-fifths of the left ureter alongside a separate sensor working wire.  No mucosal abnormalities were found.  Next, semirigid ureteroscopy was performed  of the distal four-fifths of the right ureter alongside a separate sensor working wire.  No mucosal abnormalities were found. The semirigid scope was exchanged for a short-length ureteral access sheath over the sensor working wire and flexible digital  ureteroscopy was performed in the proximal right ureter and systematic inspection of the right kidney including all calices x3.  There were multifocal papillary tip calcifications noted, the dominant being in the upper mid pole. The majority of these  were amenable to simple basketing. They were removed, set aside for compositional analysis. The larger were  too large for simple basketing.  Holmium laser energy was applied to stone  at a setting of 0.2 joules and 20 Hz. This fragmented the stone into  two smaller pieces, those pieces were then being able to be removed. Following this, complete resolution of all accessible stone fragments larger than one-third mm on the right side. The access sheath was removed under continuous vision. No significant  mucosal abnormalities were found. Next, the access sheath was placed over left sensor working wire to the proximal left ureter, and flexible digital ureteroscopy was performed of the proximal left ureter and systematic inspection of the left kidney  including all calices x3.  Similarly, there were multifocal papillary tip calcifications noted, with a predominant calcification in the lower pole that was too large for simple basketing. It was repositioned to the upper pole, and laser lithotripsy was  performed, fragmenting it into approximately four smaller pieces that were then amenable to basket retrieval, such that all fragments larger than one-third mm had been removed from the left side. The access sheath was removed under continuous vision. No  significant mucosal abnormalities were found. Given the bilateral nature of the procedure, it was felt that brief interval stenting with tethered stent would be most prudent and bilateral 5 x 22 Polaris-type stent was carefully placed over the remaining  safety wires using fluoroscopic guidance. Good proximal and distal planes were noted.  Tether was left in place, tied together, trimmed to length, and tucked into the vagina. The procedure was terminated. The patient tolerated the procedure well with no  immediate periprocedural complications. The patient was taken to the Postanesthesia Care Unit in stable condition with plan for discharge home.       PAA D: 12/09/2023 4:21:49 pm T: 12/10/2023 1:56:00 am  JOB: [id number]/ [id number]
